# Patient Record
Sex: Female | Born: 1970 | Race: Black or African American | Hispanic: No | Marital: Married | Smoking: Never smoker
Health system: Southern US, Community
[De-identification: ages and names within clinical notes are randomized; demographics above are authoritative.]

## PROBLEM LIST (undated history)

## (undated) DIAGNOSIS — Z9221 Personal history of antineoplastic chemotherapy: Secondary | ICD-10-CM

## (undated) DIAGNOSIS — C50919 Malignant neoplasm of unspecified site of unspecified female breast: Secondary | ICD-10-CM

## (undated) DIAGNOSIS — C50912 Malignant neoplasm of unspecified site of left female breast: Secondary | ICD-10-CM

## (undated) DIAGNOSIS — Z923 Personal history of irradiation: Secondary | ICD-10-CM

## (undated) DIAGNOSIS — R05 Cough: Secondary | ICD-10-CM

## (undated) HISTORY — PX: PORTACATH PLACEMENT: SHX2246

## (undated) HISTORY — PX: UTERINE FIBROID SURGERY: SHX826

## (undated) HISTORY — PX: CYSTECTOMY: SUR359

## (undated) HISTORY — PX: REDUCTION MAMMAPLASTY: SUR839

---

## 1998-06-07 ENCOUNTER — Other Ambulatory Visit: Admission: RE | Admit: 1998-06-07 | Discharge: 1998-06-07 | Payer: Self-pay | Admitting: Obstetrics and Gynecology

## 1998-08-23 ENCOUNTER — Emergency Department (HOSPITAL_COMMUNITY): Admission: EM | Admit: 1998-08-23 | Discharge: 1998-08-23 | Payer: Self-pay

## 2000-05-27 ENCOUNTER — Other Ambulatory Visit: Admission: RE | Admit: 2000-05-27 | Discharge: 2000-05-27 | Payer: Self-pay | Admitting: Obstetrics and Gynecology

## 2003-09-23 ENCOUNTER — Emergency Department (HOSPITAL_COMMUNITY): Admission: AC | Admit: 2003-09-23 | Discharge: 2003-09-23 | Payer: Self-pay

## 2005-06-11 ENCOUNTER — Ambulatory Visit (HOSPITAL_COMMUNITY): Admission: RE | Admit: 2005-06-11 | Discharge: 2005-06-11 | Payer: Self-pay | Admitting: Obstetrics and Gynecology

## 2006-04-18 ENCOUNTER — Inpatient Hospital Stay (HOSPITAL_COMMUNITY): Admission: AD | Admit: 2006-04-18 | Discharge: 2006-04-20 | Payer: Self-pay | Admitting: Obstetrics & Gynecology

## 2006-04-18 ENCOUNTER — Ambulatory Visit: Payer: Self-pay | Admitting: Gynecology

## 2006-04-18 ENCOUNTER — Encounter (INDEPENDENT_AMBULATORY_CARE_PROVIDER_SITE_OTHER): Payer: Self-pay | Admitting: Specialist

## 2006-04-18 HISTORY — PX: ECTOPIC PREGNANCY SURGERY: SHX613

## 2006-04-21 ENCOUNTER — Inpatient Hospital Stay (HOSPITAL_COMMUNITY): Admission: AD | Admit: 2006-04-21 | Discharge: 2006-04-21 | Payer: Self-pay | Admitting: Gynecology

## 2006-04-24 ENCOUNTER — Inpatient Hospital Stay (HOSPITAL_COMMUNITY): Admission: AD | Admit: 2006-04-24 | Discharge: 2006-04-24 | Payer: Self-pay | Admitting: Obstetrics & Gynecology

## 2006-04-24 ENCOUNTER — Ambulatory Visit: Payer: Self-pay | Admitting: Obstetrics & Gynecology

## 2006-05-01 ENCOUNTER — Inpatient Hospital Stay (HOSPITAL_COMMUNITY): Admission: AD | Admit: 2006-05-01 | Discharge: 2006-05-01 | Payer: Self-pay | Admitting: Obstetrics and Gynecology

## 2006-05-08 ENCOUNTER — Inpatient Hospital Stay (HOSPITAL_COMMUNITY): Admission: AD | Admit: 2006-05-08 | Discharge: 2006-05-08 | Payer: Self-pay | Admitting: Obstetrics and Gynecology

## 2006-05-15 ENCOUNTER — Inpatient Hospital Stay (HOSPITAL_COMMUNITY): Admission: AD | Admit: 2006-05-15 | Discharge: 2006-05-15 | Payer: Self-pay | Admitting: Obstetrics and Gynecology

## 2006-05-22 ENCOUNTER — Ambulatory Visit: Payer: Self-pay | Admitting: Obstetrics & Gynecology

## 2006-05-29 ENCOUNTER — Ambulatory Visit: Payer: Self-pay | Admitting: Gynecology

## 2006-06-04 ENCOUNTER — Ambulatory Visit: Payer: Self-pay | Admitting: Gynecology

## 2006-06-11 ENCOUNTER — Ambulatory Visit: Payer: Self-pay | Admitting: Gynecology

## 2007-05-05 ENCOUNTER — Ambulatory Visit (HOSPITAL_COMMUNITY): Admission: RE | Admit: 2007-05-05 | Discharge: 2007-05-05 | Payer: Self-pay | Admitting: Obstetrics and Gynecology

## 2007-05-12 ENCOUNTER — Ambulatory Visit (HOSPITAL_COMMUNITY): Admission: RE | Admit: 2007-05-12 | Discharge: 2007-05-12 | Payer: Self-pay | Admitting: Obstetrics and Gynecology

## 2007-06-16 ENCOUNTER — Ambulatory Visit (HOSPITAL_COMMUNITY): Admission: RE | Admit: 2007-06-16 | Discharge: 2007-06-16 | Payer: Self-pay | Admitting: Obstetrics and Gynecology

## 2007-06-30 ENCOUNTER — Ambulatory Visit (HOSPITAL_COMMUNITY): Admission: RE | Admit: 2007-06-30 | Discharge: 2007-06-30 | Payer: Self-pay | Admitting: Obstetrics and Gynecology

## 2007-08-04 ENCOUNTER — Ambulatory Visit (HOSPITAL_COMMUNITY): Admission: RE | Admit: 2007-08-04 | Discharge: 2007-08-04 | Payer: Self-pay | Admitting: Obstetrics and Gynecology

## 2007-09-01 ENCOUNTER — Ambulatory Visit (HOSPITAL_COMMUNITY): Admission: RE | Admit: 2007-09-01 | Discharge: 2007-09-01 | Payer: Self-pay | Admitting: Obstetrics and Gynecology

## 2007-10-12 ENCOUNTER — Ambulatory Visit (HOSPITAL_COMMUNITY): Admission: RE | Admit: 2007-10-12 | Discharge: 2007-10-12 | Payer: Self-pay | Admitting: Obstetrics and Gynecology

## 2008-11-25 DIAGNOSIS — C50919 Malignant neoplasm of unspecified site of unspecified female breast: Secondary | ICD-10-CM

## 2008-11-25 HISTORY — DX: Malignant neoplasm of unspecified site of unspecified female breast: C50.919

## 2009-08-21 ENCOUNTER — Encounter: Admission: RE | Admit: 2009-08-21 | Discharge: 2009-08-21 | Payer: Self-pay | Admitting: Obstetrics and Gynecology

## 2009-09-04 ENCOUNTER — Encounter: Admission: RE | Admit: 2009-09-04 | Discharge: 2009-09-04 | Payer: Self-pay | Admitting: Obstetrics and Gynecology

## 2009-09-05 ENCOUNTER — Encounter: Admission: RE | Admit: 2009-09-05 | Discharge: 2009-09-05 | Payer: Self-pay | Admitting: Obstetrics and Gynecology

## 2009-09-05 ENCOUNTER — Encounter (INDEPENDENT_AMBULATORY_CARE_PROVIDER_SITE_OTHER): Payer: Self-pay | Admitting: Radiology

## 2009-09-08 ENCOUNTER — Ambulatory Visit: Payer: Self-pay | Admitting: Oncology

## 2009-09-13 ENCOUNTER — Encounter: Admission: RE | Admit: 2009-09-13 | Discharge: 2009-09-13 | Payer: Self-pay | Admitting: Obstetrics and Gynecology

## 2009-09-15 ENCOUNTER — Encounter: Admission: RE | Admit: 2009-09-15 | Discharge: 2009-09-15 | Payer: Self-pay | Admitting: Obstetrics and Gynecology

## 2009-09-15 ENCOUNTER — Encounter: Payer: Self-pay | Admitting: Obstetrics and Gynecology

## 2009-10-04 ENCOUNTER — Ambulatory Visit: Payer: Self-pay | Admitting: Oncology

## 2009-11-03 ENCOUNTER — Encounter (INDEPENDENT_AMBULATORY_CARE_PROVIDER_SITE_OTHER): Payer: Self-pay | Admitting: General Surgery

## 2009-11-03 ENCOUNTER — Inpatient Hospital Stay (HOSPITAL_COMMUNITY): Admission: RE | Admit: 2009-11-03 | Discharge: 2009-11-05 | Payer: Self-pay | Admitting: General Surgery

## 2009-11-03 HISTORY — PX: MASTECTOMY: SHX3

## 2009-11-03 HISTORY — PX: VASCULAR DELAY PRE-TRAM: SHX5364

## 2010-01-23 ENCOUNTER — Encounter (INDEPENDENT_AMBULATORY_CARE_PROVIDER_SITE_OTHER): Payer: Self-pay | Admitting: Plastic Surgery

## 2010-01-23 ENCOUNTER — Inpatient Hospital Stay (HOSPITAL_COMMUNITY)
Admission: RE | Admit: 2010-01-23 | Discharge: 2010-01-26 | Payer: Self-pay | Source: Home / Self Care | Admitting: Plastic Surgery

## 2010-01-23 HISTORY — PX: INCISIONAL HERNIA REPAIR: SHX193

## 2010-01-23 HISTORY — PX: RECONSTRUCTION BREAST W/ TRAM FLAP: SUR1079

## 2010-04-05 ENCOUNTER — Encounter: Admission: RE | Admit: 2010-04-05 | Discharge: 2010-04-05 | Payer: Self-pay | Admitting: General Surgery

## 2010-05-30 ENCOUNTER — Ambulatory Visit
Admission: RE | Admit: 2010-05-30 | Discharge: 2010-05-31 | Payer: Self-pay | Source: Home / Self Care | Admitting: Plastic Surgery

## 2010-05-30 HISTORY — PX: BREAST REDUCTION SURGERY: SHX8

## 2010-05-30 HISTORY — PX: REVISION RECONSTRUCTED BREAST: SUR1278

## 2010-12-04 ENCOUNTER — Encounter
Admission: RE | Admit: 2010-12-04 | Discharge: 2010-12-04 | Payer: Self-pay | Source: Home / Self Care | Attending: General Surgery | Admitting: General Surgery

## 2010-12-16 ENCOUNTER — Encounter: Payer: Self-pay | Admitting: Obstetrics and Gynecology

## 2011-02-14 LAB — CBC
MCHC: 34.7 g/dL (ref 30.0–36.0)
MCV: 87 fL (ref 78.0–100.0)
Platelets: 266 10*3/uL (ref 150–400)

## 2011-02-18 LAB — BASIC METABOLIC PANEL WITH GFR
BUN: 3 mg/dL — ABNORMAL LOW (ref 6–23)
CO2: 25 meq/L (ref 19–32)
Calcium: 8.4 mg/dL (ref 8.4–10.5)
Chloride: 103 meq/L (ref 96–112)
Creatinine, Ser: 0.74 mg/dL (ref 0.4–1.2)
GFR calc non Af Amer: >60 mL/min
Glucose, Bld: 151 mg/dL — ABNORMAL HIGH (ref 70–99)
Potassium: 3.4 meq/L — ABNORMAL LOW (ref 3.5–5.1)
Sodium: 133 meq/L — ABNORMAL LOW (ref 135–145)

## 2011-02-18 LAB — ANAEROBIC CULTURE: Gram Stain: NONE SEEN

## 2011-02-18 LAB — CBC
Hemoglobin: 10.6 g/dL — ABNORMAL LOW (ref 12.0–15.0)
MCHC: 34 g/dL (ref 30.0–36.0)
MCV: 88.2 fL (ref 78.0–100.0)
RDW: 13.2 % (ref 11.5–15.5)

## 2011-02-18 LAB — GRAM STAIN: Gram Stain: NONE SEEN

## 2011-02-18 LAB — BODY FLUID CULTURE: Culture: NO GROWTH

## 2011-02-26 LAB — DIFFERENTIAL
Lymphocytes Relative: 37 % (ref 12–46)
Monocytes Absolute: 0.3 10*3/uL (ref 0.1–1.0)
Monocytes Relative: 6 % (ref 3–12)
Neutro Abs: 2.9 10*3/uL (ref 1.7–7.7)
Neutrophils Relative %: 54 % (ref 43–77)

## 2011-02-26 LAB — CBC
HCT: 38.5 % (ref 36.0–46.0)
Hemoglobin: 11.6 g/dL — ABNORMAL LOW (ref 12.0–15.0)
MCHC: 34.8 g/dL (ref 30.0–36.0)
MCHC: 35.2 g/dL (ref 30.0–36.0)
MCV: 88.4 fL (ref 78.0–100.0)
MCV: 89.7 fL (ref 78.0–100.0)
Platelets: 238 10*3/uL (ref 150–400)
RBC: 3.71 MIL/uL — ABNORMAL LOW (ref 3.87–5.11)
RDW: 12.1 % (ref 11.5–15.5)
WBC: 5.4 10*3/uL (ref 4.0–10.5)
WBC: 8.1 10*3/uL (ref 4.0–10.5)

## 2011-02-26 LAB — BASIC METABOLIC PANEL
CO2: 23 mEq/L (ref 19–32)
Calcium: 9.1 mg/dL (ref 8.4–10.5)
Chloride: 111 mEq/L (ref 96–112)
Creatinine, Ser: 0.75 mg/dL (ref 0.4–1.2)
GFR calc Af Amer: >60 mL/min (ref >60)
Sodium: 140 mEq/L (ref 135–145)

## 2011-02-26 LAB — COMPREHENSIVE METABOLIC PANEL
Albumin: 3.8 g/dL (ref 3.5–5.2)
BUN: 6 mg/dL (ref 6–23)
Calcium: 10 mg/dL (ref 8.4–10.5)
Creatinine, Ser: 0.65 mg/dL (ref 0.4–1.2)
Glucose, Bld: 82 mg/dL (ref 70–99)
Total Protein: 7.3 g/dL (ref 6.0–8.3)

## 2011-02-26 LAB — CEA: CEA: 2.4 ng/mL (ref 0.0–5.0)

## 2011-02-26 LAB — HCG, SERUM, QUALITATIVE: Preg, Serum: NEGATIVE

## 2011-04-12 NOTE — Group Therapy Note (Signed)
Aimee Poole, RICE NO.:  [id number]   MEDICAL RECORD NO.:  [id number]          PATIENT TYPE:  WOC   LOCATION:  [facility]                   FACILITY:  [facility]   PHYSICIAN:  Dorthula Perfect, MD     DATE OF BIRTH:  1970-12-11   DATE OF SERVICE:                                    CLINIC NOTE   This 40 year old African American, gravida 1, underwent operative  laparoscopy followed by laparotomy and partial left salpingectomy by Dr.  Mia Creek on May 25th. She had previously had a right salpingo oophorectomy.  Details are in both his discharge summary and operative note placed in this  chart. The distal half of the left fallopian tube was removed. The patient  said that he then tied off the distal remaining part of the tube, although I  do not see that is specifically mentioned. She stayed in the hospital for  two days. She is back or postoperative day visit and staple removal.   The patient is having some problems with constipation. She is still on  Percocet.   PHYSICAL EXAMINATION:  Her abdomen is somewhat obese. It is soft and  nontender. She has a transverse suprapubic skin incision. Staples are  removed. The left half of the incision has the edges well healed. On the  right side, the skin edges are not directly opposing and the raw area is  granulating in. Dressing is applied.   DIAGNOSIS:  Left tubal ectopic pregnancy.   DISPOSITION:  1.  Repeat quantitative hCG today.  2.  The patient is to return in a week to check her incision and to possibly      repeat the hCG again.  3.  She is encouraged to increase her fluids and to start using prunes or      prune juice as long as she is taking Percocet.           ______________________________  Dorthula Perfect, MD     ER/MEDQ  D:  04/24/2006  T:  04/24/2006  Job:  [id number]

## 2011-04-12 NOTE — Op Note (Signed)
Aimee Poole, Aimee Poole                 ACCOUNT NO.:  [id number]   MEDICAL RECORD NO.:  [id number]          PATIENT TYPE:  INP   LOCATION:  9302                          [facility]   PHYSICIAN:  Ginger Carne, MD  DATE OF BIRTH:  12/23/1970   DATE OF PROCEDURE:  04/18/2006  DATE OF DISCHARGE:                                 OPERATIVE REPORT   PREOPERATIVE DIAGNOSIS:  Left tubal pregnancy.   POSTOPERATIVE DIAGNOSES:  1.  Left tubal pregnancy.  2.  Severe pelvic peritoneal intestinal adhesions.   OPERATIVE PROCEDURE:  Operative laparoscopy followed by laparotomy and  partial excision of distal left tube.   SURGEON:  Ginger Carne, M.D.   COMPLICATIONS:  None immediate.   ESTIMATED BLOOD LOSS:  Less than 75 mL.   SPECIMEN:  Distal portion of left tube.   OPERATIVE FINDINGS:  The patient had on ultrasound a 2 cm gestational sac  noted to be separate and apart from the intrauterine cavity on the left  side.  She had a quantitative value of 8245 units today.  The patient had a  previous right salpingo-oophorectomy and was desirous of a tubal ligation  and/or left salpingectomy to pursue in vitro fertilization in the future.   The patient on laparoscopic evaluation there demonstrated significant and  severe pelvic and peritoneal intestinal adhesions.  Specifically, the left  adnexa and lateral left uterus/cornu had numerous adhesions to the left  pelvic sidewall and large/small bowel.  The left sigmoid colon was attached  to the left pelvic sidewall in addition to portions of small bowel similarly  adherent to the left adnexa.  Despite multiple efforts to perform  adhesiolysis on the left adnexa laparoscopically, it was felt prudent due to  the difficulty of exposure to perform a laparotomy.  No injury of bowel was  noted at the time of the laparoscopic portion.  Upon laparotomy, the  adhesive disease was dealt with by careful adhesiolysis of omentum and  distal small  bowel and portion of rectosigmoid colon that was dissected off  the left ovary.  The proximal portion of the left tube could not be  identified.  The left cornu and lateral uterine wall were densly adherent to  the pelic side wall. These areas were friable due to decidual changes, bled  easily and the risk of tearing too great to continue dissection. Only the  distal portion of the left tube could be identified.  The ampullary region  had numerous adhesions of the rectosigmoid, and it was felt imprudent to try  and dissect this area because of encountering bleeding and possible injury  to the serosa of large bowel.  For this reason , only the distal portion of  the tube could be removed and sent for pathology.  Bleeding points were  hemostatically checked, and the ovary was intact and not bleeding.  The  uterus could not be sufficiently mobilized to identify the proximal portion  without risking injury to the bowel and causing bleeding that would be  difficult to control.  The right adnexa had been previously excised  prior to  closure of all bowel within the pelvis and was again carefully identified  without injury noted.   OPERATIVE PROCEDURE:  The patient was prepped and draped in the usual  fashion and placed in the lithotomy position.  Betadine solution used for  antiseptic, and the patient was catheterized prior to procedure.  After  adequate general anesthesia, the tenaculum placed on the cervix and a  Hickman tenaculum placed in the endocervical canal.  This was then followed  by a  vertical infraumbilical incision and a Veress needle placed in same  incision.  Opening closing pressures were 10-15 mmHg.  The needle released  and the trocar placed in the same incision.  Laparoscope placed in the  trocar sleeve.  Two 5 mm ports were made in the left lower quadrant and left  hypogastric regions under direct visualization.  Attempts at adhesiolysis of  the left adnexa proved  unsuccessful to adequately mobilize the entire left  tube.  For this reason, the procedure was discontinued, gas released,  trocars removed.  Closure of the 10 mm fascia site with 0 Vicryl suture and  4-0 Vicryl for subcuticular closure.   The patient was repositioned and a Pfannenstiel incision was made.  Careful  dissection of the left half of the uterus including its adnexa and taking  down adhesions of bowel and omentum was performed in spite of multiple  efforts as described in the findings section.  It was impossible to identify  the proximal portion of the tube or the cornual region of the uterus.  Only  the ovary and the distal portion of the left tube could be carefully  identified.  The distal portion of tube was clamped, cut and ligated with 0  Vicryl  suture and sent for pathology.  Although this was not a proximal  ligation, this was the best effort that could be performed at the time.  Bleeding points hemostatically checked.  Blood clots removed.  Copious  irrigation with lactated Ringer's followed.  Inspection of the rectosigmoid  colon and large bowel revealed no injuries to serosa or other structures.  Closure of the fascia in one layer with 0 PDS running suture and skin  staples for the skin.  Instrument and sponge count were correct.  The  patient tolerated the procedure well and returned to the post anesthesia  recovery room in excellent condition.      Ginger Carne, MD  Electronically Signed     SHB/MEDQ  D:  04/18/2006  T:  04/19/2006  Job:  [id number]

## 2011-04-12 NOTE — Group Therapy Note (Signed)
Aimee Poole, Aimee Poole NO.:  [id number]   MEDICAL RECORD NO.:  [id number]          PATIENT TYPE:  WOC   LOCATION:  [facility]                   FACILITY:  [facility]   PHYSICIAN:  Dorthula Perfect, MD     DATE OF BIRTH:  04/21/1971   DATE OF SERVICE:                                    CLINIC NOTE   This 40 year old African-American female, gravida 1, underwent operative  laparoscopy followed by laparotomy and partial left salpingectomy by Dr.  Mia Creek on May 25th for an ectopic pregnancy.  She had previously had a  salpingo-oophorectomy.  The surgery was complicated by massive pelvic  adhesions.  Pathology report did not reveal the presence of trophoblastic  disease.  She was treated with methotrexate.  She has been having weekly  quantitative HCGs done.  On June 7th, the value was 2747.  On June 14th,  1471.  On June 21, the value was 762.  The patient has obviously not started  her menstrual period yet.  She is asymptomatic.  She returns today for  repeat quantitative HCG as well as check of her incision and pelvic exam.  She is not having problems with constipation now.   PHYSICAL EXAMINATION:  ABDOMEN:  Her abdomen is somewhat obese, soft and  nontender.  Her transverse suprapubic skin incision is now well healed, and  there are no raw areas left.  PELVIC:  External genitalia, BUS, and vaginal vault were visualized, as was  the cervix.  The patient has mild lower abdominal tenderness but no  guarding.  It is hard to feel the uterus.  No adnexal masses are noted.   DIAGNOSIS:  Left tubal ectopic pregnancy, status post surgery and  methotrexate.   DISPOSITION:  1.  Repeat quantitative HCG today.  2.  Repeat quantitative HCG in one week.  3.  Patient __________ to be re-examined again in two weeks.  4.  If the values do not continue to drop satisfactorily, we will have to      consider additional treatment with methotrexate.     ______________________________  Dorthula Perfect, MD     ER/MEDQ  D:  05/22/2006  T:  05/22/2006  Job:  [id number]

## 2011-04-12 NOTE — Discharge Summary (Signed)
[name], [name]                 ACCOUNT NO.:  [id number]   MEDICAL RECORD NO.:  [id number]          PATIENT TYPE:  INP   LOCATION:  9302                          [facility]   PHYSICIAN:  Ginger Carne, MD  DATE OF BIRTH:  [birth year]   DATE OF ADMISSION:  04/18/2006  DATE OF DISCHARGE:  04/20/2006                                 DISCHARGE SUMMARY   REASON FOR HOSPITALIZATION:  Left tubal pregnancy.   IN-HOSPITAL PROCEDURES:  1.  Operative laparoscopy, followed by laparotomy and partial left      salpingectomy.  2.  Methotrexate administration.   FINAL DIAGNOSIS:  Left tubal pregnancy.   HOSPITAL COURSE:  This patient is a 40 year old gravida 1, para 0, African-  American female who presented to the maternity admission unit with left  lower quadrant pain.  The patient had a quantitative HCG of 8245 and a 2 cm  cystic mass on the left adnexa without an intrauterine pregnancy identified.  The patient has had in 1999 a right salpingo-oophorectomy and in 2006 a  myomectomy followed by a diagnostic laparoscopy.  At the time of  laparoscopic surgery on Apr 18, 2006, the patient was noted to have  significantly dense adhesions of the left adnexa with an immobile uterus.  Because of safety reasons, it was deemed appropriate to pursue a laparotomy  to avoid injury to both large and small bowel.  The patient had also desired  to have her remaining tube tied to pursue in vitro fertilization in the  future.  Upon laparotomy, it was impossible to dissect the lateral portion  of the uterus on the left side without risking injury to said structure,  including bleeding and tearing of the cornual portion of the uterus.  The  rectosigmoid colon in addition to dense adhesions in the left pelvic  sidewall made it virtually impossible to even identify the entire length of  the left tube.  Only the distal 2 cm of left tube could be clearly seen.  The left ovary was seen but the ectopic proper  could not be identified.  The  tube was extremely friable, as was the uterus, and it was therefore decided  to stop any further pursuit in identification of the ectopic and proceed  with methotrexate administration.  Because of the aforementioned findings,  the left tube could not be ligated.   The patient received appropriate doses of methotrexate for an ectopic  pregnancy on Apr 18, 2006.  All blood tests parameters were within normal  limits.  The patient is A negative.   The patient received an intramuscular RhoGAM.  She remained afebrile.  Vital  signs were stable postoperatively.  Abdomen was soft.  Incision dry/  The  patient had not passed flatus at the time of discharge but had good bowel  sounds.  Calves without tenderness and lungs were clear.   The patient will be followed in the maternity admission unit on Apr 21, 2006, for a quantitative HCG and on Apr 24, 2006, a repeat quantitative HCG  and staple removal.  Instructions including  contacting the office for  temperature elevation above 100.4 degrees Fahrenheit, difficulty with bowel  movements, flatus, abdominal swelling, incisional  pain or drainage, calf tenderness or any other complaints, the patient is to  notify us immediately.  The patient was prescribed Percocet 5/325 mg one 1  to 2 every four to six hours.  All questions answered to the satisfaction of  said patient.      Ginger Carne, MD  Electronically Signed     SHB/MEDQ  D:  04/20/2006  T:  04/21/2006  Job:  [id number]

## 2011-04-29 ENCOUNTER — Encounter (INDEPENDENT_AMBULATORY_CARE_PROVIDER_SITE_OTHER): Payer: Self-pay | Admitting: General Surgery

## 2011-07-17 ENCOUNTER — Encounter (INDEPENDENT_AMBULATORY_CARE_PROVIDER_SITE_OTHER): Payer: Self-pay | Admitting: General Surgery

## 2011-09-24 ENCOUNTER — Ambulatory Visit (INDEPENDENT_AMBULATORY_CARE_PROVIDER_SITE_OTHER): Payer: BC Managed Care – PPO | Admitting: General Surgery

## 2011-09-24 ENCOUNTER — Encounter (INDEPENDENT_AMBULATORY_CARE_PROVIDER_SITE_OTHER): Payer: Self-pay | Admitting: General Surgery

## 2011-09-24 VITALS — BP 132/98 | HR 88 | Temp 97.3°F | Resp 12 | Ht 62.0 in | Wt 190.0 lb

## 2011-09-24 DIAGNOSIS — Z853 Personal history of malignant neoplasm of breast: Secondary | ICD-10-CM

## 2011-09-24 NOTE — Progress Notes (Signed)
Subjective:     Patient ID: Aimee Poole, female   DOB: December 14, 1970, 40 y.o.   MRN: [id number]  HPI Is is a 40 year old female who underwent a left simple mastectomy and axillary symptoms a biopsy for DCIS in 2010. She was estrogen receptor progesterone receptor negative. Had no flatus or a conference it was decided not to pursue endocrine or radiation therapy at that point. She has since undergone a TRAM flap on the left side as well as a reduction on the other side as well. She has no real complaints except for some tenderness at her incision is right now. She underwent a mammogram in January of this year which is negative and she is due for another mammogram in January she does inquire today about weight loss options we discussed weight loss surgery today. She has no complaints referable to her breasts or any concerns about recurrence on her own exam.  Review of Systems     Objective:   Physical Exam  Constitutional: She appears well-developed and well-nourished.  Pulmonary/Chest: Right breast exhibits no inverted nipple, no mass, no nipple discharge, no skin change and no tenderness. Left breast exhibits no mass and no tenderness. Breasts are symmetrical.    Lymphadenopathy:    She has no cervical adenopathy.    She has no axillary adenopathy.       Right: No supraclavicular adenopathy present.       Left: No supraclavicular adenopathy present.       Assessment:     Stage 0 left breast cancer s/p mastectomy    Plan:     She is due for her mammogram in January. We discussed again self exams. She should continue clinical exams every 6 months and I will plan on seeing her back in 6 months. I also gave her information on her weight loss surgery seminars and she very much interested at this point.

## 2011-09-24 NOTE — Patient Instructions (Signed)
Breast Problems and Self-Exam Completing monthly breast exams may pick up problems early and save lives. There can be numerous causes of swelling, tenderness or lumps in the breasts. Some of these causes are:   Fibrocystic breast syndrome (noncancerous lumps). This is the most common cause of lumps in the breast.   Fibroadenoma breast tumors of unknown cause. These are noncancerous (benign) lumps.   Benign fatty tumors (lipomas).   Cancer of the breast.  By doing monthly breast exams, you get to know how your breasts feel and how they can change from month to month. This allows you to notice changes early. It can also offer you some reassurance that your breast health is good.  BREAST SELF-EXAM There are a few points to follow when doing a thorough breast exam. The best time to examine your breasts is 5 to 7 days after your menstrual period is over. During menstruation, the breasts are lumpier, and it may be more difficult to pick up changes. If you do not menstruate, have reached menopause, or had a hysterectomy (uterus removal), examine your breasts the first day of every month. After 3 to 4 months, you will become more familiar with the variations of your breasts and more comfortable with the exam.  Perform your breast exam monthly. Keep a written record with breast changes or normal findings for each breast. This makes it easier to be sure of changes, so you do not need to depend only on memory for size, tenderness, or location. Try to do the exam at the same time each month, and write down where you are in your menstrual cycle, if you are still menstruating.   Look at your breasts. Stand in front of a mirror with your hands clasped behind your head. Tighten your chest muscles and look for asymmetry. This means a difference in shape or contour from one breast to the other, such as puckers, dips or bumps. Also, look for skin changes.   Lean forward with your hands on your hips. Again, look for  symmetry and skin changes.   While showering, soap the breasts. Then, carefully feel the breasts with your fingertips, while holding the other arm (on the side of the breast you are examining) over your head. Do this with each breast, carefully feeling for lumps or changes. Typically, a circular motion with moderate fingertip pressure should be used.   Repeat this exam while lying on your back. Put your arm behind your head and a pillow under your shoulders. Again, use your fingertips to examine both breasts, feeling for lumps and thickening. Begin at the top of your breast, and go clockwise around the whole breast.   At the end of your exam, gently squeeze each nipple to see if there is any drainage of fluids. Look for nipple changes, dimpling, or redness.   Lastly, examine the upper chest and collarbone (clavicle) areas, and in your armpits.  It is not necessary to be alarmed if you find a breast lump. Most of them are not cancerous. However, it is necessary to see your caregiver if a lump is found, in order to have it looked at. Document Released: 11/11/2005 Document Revised: 07/24/2011 Document Reviewed: 02/28/2009 [employer] Patient Information 2012 [employer]. 

## 2012-02-27 ENCOUNTER — Other Ambulatory Visit (INDEPENDENT_AMBULATORY_CARE_PROVIDER_SITE_OTHER): Payer: Self-pay | Admitting: General Surgery

## 2012-02-27 DIAGNOSIS — Z1231 Encounter for screening mammogram for malignant neoplasm of breast: Secondary | ICD-10-CM

## 2012-02-28 ENCOUNTER — Ambulatory Visit
Admission: RE | Admit: 2012-02-28 | Discharge: 2012-02-28 | Disposition: A | Discharge status: Home / Self Care | Payer: BC Managed Care – PPO | Source: Ambulatory Visit | Attending: General Surgery | Admitting: General Surgery

## 2012-02-28 DIAGNOSIS — Z1231 Encounter for screening mammogram for malignant neoplasm of breast: Secondary | ICD-10-CM

## 2012-03-13 ENCOUNTER — Encounter (INDEPENDENT_AMBULATORY_CARE_PROVIDER_SITE_OTHER): Payer: Self-pay | Admitting: General Surgery

## 2012-03-13 ENCOUNTER — Ambulatory Visit (INDEPENDENT_AMBULATORY_CARE_PROVIDER_SITE_OTHER): Payer: BC Managed Care – PPO | Admitting: General Surgery

## 2012-03-13 VITALS — BP 118/70 | HR 80 | Temp 97.8°F | Resp 20 | Ht 61.5 in | Wt 185.0 lb

## 2012-03-13 DIAGNOSIS — C50412 Malignant neoplasm of upper-outer quadrant of left female breast: Secondary | ICD-10-CM | POA: Insufficient documentation

## 2012-03-13 DIAGNOSIS — Z853 Personal history of malignant neoplasm of breast: Secondary | ICD-10-CM

## 2012-03-13 DIAGNOSIS — C50919 Malignant neoplasm of unspecified site of unspecified female breast: Secondary | ICD-10-CM

## 2012-03-13 NOTE — Patient Instructions (Signed)

## 2012-03-13 NOTE — Progress Notes (Signed)
Subjective:     Patient ID: Aimee Poole, female   DOB: [birth year], 41 y.o.   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 This was worse after negative. She has been followed by me with clinical exams every 6 months since then. She has a recent mammogram that is rated BI-RADS one and is due for another mammogram in one year. She reports no complaints referable to her breast today except for some itching at the site of her incisions. She has undergone a reduction on the right side as well. She comes in today for followup.  Review of Systems DIGITAL SCREENING MAMMOGRAM WITH CAD:  There are scattered fibroglandular densities. Left TRAM negative. No masses or malignant type  calcifications are identified. Compared with prior studies.  Images were processed with CAD.  IMPRESSION:  No specific mammographic evidence of malignancy. Next screening mammogram is recommended in one  year.  A result letter of this screening mammogram will be mailed directly to the patient.  ASSESSMENT: Negative - BI-RADS 1     Objective:   Physical Exam  Vitals reviewed. Constitutional: She appears well-developed and well-nourished.  Pulmonary/Chest: Right breast exhibits no inverted nipple, no mass, no nipple discharge, no skin change and no tenderness. Left breast exhibits no mass and no tenderness.    Lymphadenopathy:    She has no cervical adenopathy.    She has no axillary adenopathy.       Right: No supraclavicular adenopathy present.       Left: No supraclavicular adenopathy present.       Assessment:     History stage 0 left breast cancer s/p mastectomy    Plan:     No clinical evidence of recurrence MMG fine repeat in one year Monthly SBE Return for clinical exam in 6 months Discussed weight loss options again today

## 2012-03-20 ENCOUNTER — Other Ambulatory Visit: Payer: Self-pay | Admitting: Obstetrics and Gynecology

## 2012-03-20 ENCOUNTER — Other Ambulatory Visit (HOSPITAL_COMMUNITY)
Admission: RE | Admit: 2012-03-20 | Discharge: 2012-03-20 | Disposition: A | Discharge status: Home / Self Care | Payer: BC Managed Care – PPO | Source: Ambulatory Visit | Attending: Obstetrics and Gynecology | Admitting: Obstetrics and Gynecology

## 2012-03-20 DIAGNOSIS — Z124 Encounter for screening for malignant neoplasm of cervix: Secondary | ICD-10-CM | POA: Insufficient documentation

## 2012-09-25 ENCOUNTER — Telehealth (INDEPENDENT_AMBULATORY_CARE_PROVIDER_SITE_OTHER): Payer: Self-pay

## 2012-09-25 NOTE — Telephone Encounter (Signed)
LMOV pt's appt has been moved from 10/02/12 at 9:40am, to 09/29/12 at 9:40am.

## 2012-09-28 ENCOUNTER — Telehealth (INDEPENDENT_AMBULATORY_CARE_PROVIDER_SITE_OTHER): Payer: Self-pay | Admitting: General Surgery

## 2012-09-28 NOTE — Telephone Encounter (Signed)
LMOM letting pt know that I am having to reschedule her appt on 11/5 to 11/21 at 4:40.

## 2012-09-29 ENCOUNTER — Ambulatory Visit (INDEPENDENT_AMBULATORY_CARE_PROVIDER_SITE_OTHER): Payer: BC Managed Care – PPO | Admitting: General Surgery

## 2012-10-02 ENCOUNTER — Ambulatory Visit (INDEPENDENT_AMBULATORY_CARE_PROVIDER_SITE_OTHER): Payer: BC Managed Care – PPO | Admitting: General Surgery

## 2012-10-15 ENCOUNTER — Ambulatory Visit (INDEPENDENT_AMBULATORY_CARE_PROVIDER_SITE_OTHER): Payer: BC Managed Care – PPO | Admitting: General Surgery

## 2012-10-16 ENCOUNTER — Encounter (INDEPENDENT_AMBULATORY_CARE_PROVIDER_SITE_OTHER): Payer: Self-pay | Admitting: General Surgery

## 2012-10-16 ENCOUNTER — Ambulatory Visit (INDEPENDENT_AMBULATORY_CARE_PROVIDER_SITE_OTHER): Payer: BC Managed Care – PPO | Admitting: General Surgery

## 2012-10-16 VITALS — BP 104/76 | HR 85 | Temp 97.8°F | Ht 61.5 in | Wt 180.0 lb

## 2012-10-16 DIAGNOSIS — Z853 Personal history of malignant neoplasm of breast: Secondary | ICD-10-CM

## 2012-10-16 NOTE — Patient Instructions (Signed)

## 2012-10-18 NOTE — Progress Notes (Signed)
Subjective:     Patient ID: Aimee Poole, female   DOB: [birth year], 41 y.o.   MRN: [id number]  HPI 46 yof who underwent left sm/sn for large area of dcis followed by tram flap.  She reports no real complaints with either breast right now.  I have been seeing every six months for exams.  She has nl mmg in 4/13.  She is otherwise doing well without any other health issues that have arisen.  Review of Systems     Objective:   Physical Exam  Vitals reviewed. Constitutional: She appears well-developed and well-nourished.  Neck: Neck supple.  Pulmonary/Chest: Right breast exhibits no inverted nipple, no mass, no nipple discharge, no skin change and no tenderness. Left breast exhibits no mass and no tenderness.    Lymphadenopathy:    She has no cervical adenopathy.    She has no axillary adenopathy.       Right: No supraclavicular adenopathy present.       Left: No supraclavicular adenopathy present.       Assessment:     History left breast dcis    Plan:     She has no clinical evidence of recurrence. She is just going to continue following other side with exams, mmg and clinical exams every six months.  We discussed briefly general health issues.  I will continue to see every six months.

## 2013-02-18 ENCOUNTER — Encounter (INDEPENDENT_AMBULATORY_CARE_PROVIDER_SITE_OTHER): Payer: Self-pay | Admitting: General Surgery

## 2013-02-18 ENCOUNTER — Other Ambulatory Visit: Payer: Self-pay

## 2013-02-18 DIAGNOSIS — Z1231 Encounter for screening mammogram for malignant neoplasm of breast: Secondary | ICD-10-CM

## 2013-03-12 ENCOUNTER — Ambulatory Visit
Admission: RE | Admit: 2013-03-12 | Discharge: 2013-03-12 | Disposition: A | Discharge status: Home / Self Care | Payer: BC Managed Care – PPO | Source: Ambulatory Visit

## 2013-03-12 ENCOUNTER — Other Ambulatory Visit: Payer: Self-pay

## 2013-03-12 DIAGNOSIS — Z1231 Encounter for screening mammogram for malignant neoplasm of breast: Secondary | ICD-10-CM

## 2013-05-07 ENCOUNTER — Ambulatory Visit (INDEPENDENT_AMBULATORY_CARE_PROVIDER_SITE_OTHER): Payer: BC Managed Care – PPO | Admitting: General Surgery

## 2013-07-16 ENCOUNTER — Encounter (INDEPENDENT_AMBULATORY_CARE_PROVIDER_SITE_OTHER): Payer: Self-pay | Admitting: General Surgery

## 2013-07-16 ENCOUNTER — Ambulatory Visit (INDEPENDENT_AMBULATORY_CARE_PROVIDER_SITE_OTHER): Payer: BC Managed Care – PPO | Admitting: General Surgery

## 2013-07-16 VITALS — BP 122/80 | HR 70 | Resp 18 | Ht 61.5 in | Wt 179.4 lb

## 2013-07-16 DIAGNOSIS — Z853 Personal history of malignant neoplasm of breast: Secondary | ICD-10-CM

## 2013-07-16 NOTE — Progress Notes (Signed)
Subjective:     Patient ID: Aimee Poole, female   DOB: 04-23-1971, 42 y.o.   MRN: [id number]  HPI 89 yof who underwent left sm/sn for large area of dcis followed by tram flap. She reports no real complaints with either breast right now. I have been seeing every six months for exams. She has nl mmg in 4/14. She is otherwise doing well without any other health issues that have arisen since our last visit.  She is doing self exams and has noted no changes.  She has no nipple discharge. She has two of her nieces as well as her 15 year old living in her house.  She is overall doing well.   Review of Systems RIGHT DIGITAL SCREENING MAMMOGRAM WITH CAD  Comparison: None.  FINDINGS:  ACR Breast Density Category 2: There is a scattered fibroglandular  pattern.  The patient has had a left mastectomy. No suspicious masses,  architectural distortion, or calcifications are present.  Images were processed with CAD.  IMPRESSION:  No evidence of malignancy. Screening mammography is recommended in  one year.  BI-RADS CATEGORY 1: Negative.      Objective:   Physical Exam  Vitals reviewed. Constitutional: She appears well-developed and well-nourished.  Pulmonary/Chest: Right breast exhibits no inverted nipple, no mass, no nipple discharge, no skin change and no tenderness. Left breast exhibits no mass, no skin change and no tenderness.    Lymphadenopathy:    She has no cervical adenopathy.    She has no axillary adenopathy.       Right: No supraclavicular adenopathy present.       Left: No supraclavicular adenopathy present.       Assessment:     Left breast dcis s/p mastectomy    Plan:     She has no clinical evidence of recurrence.  We discussed follow up with exams and annual mammography.  I will continue to see every 6 months.  She has some extra skin lateral on left side and this breast is a little bigger.  I offered her referral to see another plastic surgeon but she does not want to  undergo any more surgery right now.We discussed healthy living and exercise also.

## 2013-11-25 DIAGNOSIS — Z9221 Personal history of antineoplastic chemotherapy: Secondary | ICD-10-CM

## 2013-11-25 DIAGNOSIS — Z923 Personal history of irradiation: Secondary | ICD-10-CM

## 2013-11-25 HISTORY — DX: Personal history of irradiation: Z92.3

## 2013-11-25 HISTORY — DX: Personal history of antineoplastic chemotherapy: Z92.21

## 2013-12-14 ENCOUNTER — Encounter (INDEPENDENT_AMBULATORY_CARE_PROVIDER_SITE_OTHER): Payer: Self-pay | Admitting: General Surgery

## 2014-02-11 ENCOUNTER — Ambulatory Visit (INDEPENDENT_AMBULATORY_CARE_PROVIDER_SITE_OTHER): Payer: BC Managed Care – PPO | Admitting: General Surgery

## 2014-02-11 ENCOUNTER — Encounter (INDEPENDENT_AMBULATORY_CARE_PROVIDER_SITE_OTHER): Payer: Self-pay | Admitting: General Surgery

## 2014-02-11 VITALS — BP 128/80 | HR 70 | Resp 16 | Ht 61.5 in | Wt 176.0 lb

## 2014-02-11 DIAGNOSIS — C50919 Malignant neoplasm of unspecified site of unspecified female breast: Secondary | ICD-10-CM

## 2014-02-11 NOTE — Patient Instructions (Signed)

## 2014-02-11 NOTE — Progress Notes (Signed)
Subjective:     Patient ID: Aimee Poole, female   DOB: 10-19-1971, 43 y.o.   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 She reports no real complaints with either breast right now except the lateral portion of tram bothers her. I have been seeing every six months for exams. She has nl mmg in 4/14. She is otherwise doing well without any other health issues that have arisen. Her father in law recently passed from myeloma. No issues with left arm.   Review of Systems RIGHT DIGITAL SCREENING MAMMOGRAM WITH CAD  Comparison: None.  FINDINGS:  ACR Breast Density Category 2: There is a scattered fibroglandular  pattern.  The patient has had a left mastectomy. No suspicious masses,  architectural distortion, or calcifications are present.  Images were processed with CAD.  IMPRESSION:  No evidence of malignancy. Screening mammography is recommended in  one year.  BI-RADS CATEGORY 1: Negative.     Objective:   Physical Exam  Constitutional: She appears well-developed and well-nourished.  Pulmonary/Chest: Right breast exhibits no inverted nipple, no mass, no nipple discharge, no skin change and no tenderness. Left breast exhibits no mass and no tenderness.    Lymphadenopathy:    She has no cervical adenopathy.    She has no axillary adenopathy.       Right: No supraclavicular adenopathy present.       Left: No supraclavicular adenopathy present.       Assessment:     Stage 0 left breast cancer    Plan:     She has no clinical evidence of recurrence. She's going to get her mammogram in April area she will consider doing monthly self exams I will see her back in one year for another exam. After that I could release her to her primary care physician her gynecologist. She will also call me back sooner she desires to be seen for the lateral portion of her TRAM flap that is bothering her arm right now.

## 2014-08-16 ENCOUNTER — Other Ambulatory Visit: Payer: Self-pay

## 2014-08-16 DIAGNOSIS — Z1231 Encounter for screening mammogram for malignant neoplasm of breast: Secondary | ICD-10-CM

## 2014-09-09 ENCOUNTER — Ambulatory Visit
Admission: RE | Admit: 2014-09-09 | Discharge: 2014-09-09 | Disposition: A | Discharge status: Home / Self Care | Payer: BC Managed Care – PPO | Source: Ambulatory Visit

## 2014-09-09 ENCOUNTER — Ambulatory Visit
Admission: RE | Admit: 2014-09-09 | Discharge: 2014-09-09 | Disposition: A | Discharge status: Home / Self Care | Payer: BC Managed Care – PPO | Source: Ambulatory Visit | Attending: General Surgery | Admitting: General Surgery

## 2014-09-09 ENCOUNTER — Other Ambulatory Visit (INDEPENDENT_AMBULATORY_CARE_PROVIDER_SITE_OTHER): Payer: Self-pay | Admitting: General Surgery

## 2014-09-09 DIAGNOSIS — Z1231 Encounter for screening mammogram for malignant neoplasm of breast: Secondary | ICD-10-CM

## 2014-09-09 DIAGNOSIS — N644 Mastodynia: Secondary | ICD-10-CM

## 2014-09-09 DIAGNOSIS — N6320 Unspecified lump in the left breast, unspecified quadrant: Secondary | ICD-10-CM

## 2014-09-12 ENCOUNTER — Ambulatory Visit
Admission: RE | Admit: 2014-09-12 | Discharge: 2014-09-12 | Disposition: A | Discharge status: Home / Self Care | Payer: BC Managed Care – PPO | Source: Ambulatory Visit | Attending: General Surgery | Admitting: General Surgery

## 2014-09-12 DIAGNOSIS — N6320 Unspecified lump in the left breast, unspecified quadrant: Secondary | ICD-10-CM

## 2014-09-13 ENCOUNTER — Other Ambulatory Visit (INDEPENDENT_AMBULATORY_CARE_PROVIDER_SITE_OTHER): Payer: Self-pay | Admitting: General Surgery

## 2014-09-13 DIAGNOSIS — C50912 Malignant neoplasm of unspecified site of left female breast: Secondary | ICD-10-CM

## 2014-09-16 ENCOUNTER — Telehealth: Payer: Self-pay | Admitting: *Deleted

## 2014-09-16 NOTE — Telephone Encounter (Signed)
Called pt to schedule new pt appt and genetics appt. Pt is scheduled and confirmed for Dr Lindi Adie on 09/20/14 at 1200 and genetics on 09/22/14 at 3:00. Pt was driving at the time of our discussion. She requested I leave all the information on her vm. I left appt times and contact information. Dr. Donne Hazel is aware of her appts for next week.

## 2014-09-20 ENCOUNTER — Ambulatory Visit
Admission: RE | Admit: 2014-09-20 | Discharge: 2014-09-20 | Disposition: A | Discharge status: Home / Self Care | Payer: BC Managed Care – PPO | Source: Ambulatory Visit | Attending: General Surgery | Admitting: General Surgery

## 2014-09-20 ENCOUNTER — Ambulatory Visit: Payer: BC Managed Care – PPO

## 2014-09-20 ENCOUNTER — Ambulatory Visit (HOSPITAL_BASED_OUTPATIENT_CLINIC_OR_DEPARTMENT_OTHER): Payer: BC Managed Care – PPO | Admitting: Hematology and Oncology

## 2014-09-20 ENCOUNTER — Encounter: Payer: Self-pay | Admitting: Hematology and Oncology

## 2014-09-20 VITALS — BP 143/75 | HR 94 | Temp 97.6°F | Resp 18 | Ht 61.0 in | Wt 184.8 lb

## 2014-09-20 DIAGNOSIS — C50412 Malignant neoplasm of upper-outer quadrant of left female breast: Secondary | ICD-10-CM

## 2014-09-20 DIAGNOSIS — C50912 Malignant neoplasm of unspecified site of left female breast: Secondary | ICD-10-CM

## 2014-09-20 MED ORDER — GADOBENATE DIMEGLUMINE 529 MG/ML IV SOLN
17.0000 mL | Freq: Once | INTRAVENOUS | Status: AC | PRN
  Administered 2014-09-20: 17 mL via INTRAVENOUS

## 2014-09-20 NOTE — Addendum Note (Signed)
Addended by: Prentiss Bells on: 09/20/2014 07:49 PM   Modules accepted: Orders

## 2014-09-20 NOTE — Progress Notes (Signed)
Checked in new patient with no financial issues prior to seeing the dr. She has my card if asst is needed. She has [employer] insurance. She has not been out of the country and she has her appt card.

## 2014-09-20 NOTE — Progress Notes (Signed)
[facility] CONSULT NOTE  Patient Care Team: No Pcp Per Patient as PCP - General (General Practice)  CHIEF COMPLAINTS/PURPOSE OF CONSULTATION:  Newly diagnosed left breast cancer in the reconstructed breast  HISTORY OF PRESENTING ILLNESS:  Aimee Poole 43 y.o. female is here because of recent diagnosis of left breast cancer. 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 She then underwent reconstruction with TRAM flap. She had been doing well up until last year when she started having rest discomfort. This year she requested and Dr. mammogram of the left breast. It revealed an abnormality in the upper ultrasound measured 4.4 cm in size. She then underwent MRI of the breast which revealed a 4 cm mass in the left breast adjacent to the pectoralis muscle. She underwent a biopsy that showed invasive ductal carcinoma ER 7% PR 0% HER-2/neu amplified ratio 3.84. She is here today accompanied by her family to discuss the treatment plan.  I reviewed her records extensively and collaborated the history with the patient.  SUMMARY OF ONCOLOGIC HISTORY: Oncology History   DCIS left breast     Breast cancer of upper-outer quadrant of left female breast   05/10/2009 Initial Diagnosis DCIS left breast Treated with left mastectomy 19.5 cm DCIS ER 0% PR 0%   09/09/2014 Relapse/Recurrence Ultrasound left breast: hypoechoic mass measuring 1.6 x 4.4 x 3.3 cm  MRI breast: 4 cm mass in the left breast close to the pectoralis, no lymphadenopathy   09/12/2014 Initial Biopsy Invasive ductal carcinoma ER 7%, PR 0%, HER-2 amplified ratio 3.84 average in copy #7.3    MEDICAL HISTORY:  Past Medical History  Diagnosis Date  . Cancer     left breast dcis    SURGICAL HISTORY: Past Surgical History  Procedure Laterality Date  . Mastectomy  2010  . Breast reconstruction  2011  . Breast reduction surgery  2011  . Ectopic pregnancy  surgery    . Cystectomy      ovary as well as scar tissue    SOCIAL HISTORY: History   Social History  . Marital Status: Married    Spouse Name: N/A    Number of Children: N/A  . Years of Education: N/A   Occupational History  . Not on file.   Social History Main Topics  . Smoking status: Never Smoker   . Smokeless tobacco: Not on file  . Alcohol Use: No  . Drug Use: No  . Sexual Activity:    Other Topics Concern  . Not on file   Social History Narrative  . No narrative on file    FAMILY HISTORY: Family History  Problem Relation Age of Onset  . Cancer Maternal Aunt     breast    ALLERGIES:  has No Known Allergies.  MEDICATIONS:  No current outpatient prescriptions on file.   No current facility-administered medications for this visit.    REVIEW OF SYSTEMS:   Constitutional: Denies fevers, chills or abnormal night sweats Eyes: Denies blurriness of vision, double vision or watery eyes Ears, nose, mouth, throat, and face: Denies mucositis or sore throat Respiratory: Denies cough, dyspnea or wheezes Cardiovascular: Denies palpitation, chest discomfort or lower extremity swelling Gastrointestinal:  Denies nausea, heartburn or change in bowel habits Skin: Denies abnormal skin rashes Lymphatics: Denies new lymphadenopathy or easy bruising Neurological:Denies numbness, tingling or new weaknesses Behavioral/Psych: Mood is stable, no new changes  Breast: Discomfort in the left breast  All other systems were reviewed with the patient and are negative.  PHYSICAL EXAMINATION: ECOG PERFORMANCE STATUS: 1 - Symptomatic but completely ambulatory  Filed Vitals:   09/20/14 1132  BP: 143/75  Pulse: 94  Temp: 97.6 F (36.4 C)  Resp: 18   Filed Weights   09/20/14 1132  Weight: 184 lb 12.8 oz (83.825 kg)    GENERAL:alert, no distress and comfortable SKIN: skin color, texture, turgor are normal, no rashes or significant lesions EYES: normal, conjunctiva are pink  and non-injected, sclera clear OROPHARYNX:no exudate, no erythema and lips, buccal mucosa, and tongue normal  NECK: supple, thyroid normal size, non-tender, without nodularity LYMPH:  no palpable lymphadenopathy in the cervical, axillary or inguinal LUNGS: clear to auscultation and percussion with normal breathing effort HEART: regular rate & rhythm and no murmurs and no lower extremity edema ABDOMEN:abdomen soft, non-tender and normal bowel sounds Musculoskeletal:no cyanosis of digits and no clubbing  PSYCH: alert & oriented x 3 with fluent speech NEURO: no focal motor/sensory deficits  LABORATORY DATA:  I have reviewed the data as listed Lab Results  Component Value Date   WBC 7.2 01/24/2010   HGB 12.6 05/30/2010   HCT 31.0* 01/24/2010   MCV 88.2 01/24/2010   PLT 223 01/24/2010   Lab Results  Component Value Date   NA 133* 01/24/2010   K 3.4* 01/24/2010   CL 103 01/24/2010   CO2 25 01/24/2010    RADIOGRAPHIC STUDIES: I have personally reviewed the radiological reports and agreed with the findings in the report. 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 clinical stage ER 7% PR 0% HER-2 positive ratio 3.84 in the reconstructed left breast  Pathology counseling: I discussed with her the details of pathology report including the difference between DCIS and invasive ductal carcinoma, comparing the previous pathology from 2002 current pathology. I discussed the ER PR and HER-2 receptors and their significance impact on adjuvant treatment options.  Recommendation: Neoadjuvant chemotherapy with Taxotere, carboplatin, Herceptin, Perjeta every 3 weeks for 6 cycles followed by surgery by adjuvant Herceptin with antiestrogen therapy with tamoxifen for 10 years  Chemotherapy counseling: I discussed this and benefits of chemotherapy risk of nausea  vomiting, hair loss, neuropathy, risk of infection, fatigue and weight loss and cytopenias, liver and kidney abnormalities, Herceptin and Perjeta related cardiac toxicities, Perjeta related diarrhea complication. Patient understands these risks and is willing to proceed.  Plan: Port placement, chemotherapy, echocardiogram will be ordered: Patient presented at tumor board tomorrow to come up with a multidisciplinary treatment plan.   All questions were answered. The patient knows to call the clinic with any problems, questions or concerns. I spent 40 minutes counseling the patient face to face. The total time spent in the appointment was 60 minutes and more than 50% was on counseling.     Rulon Eisenmenger, MD 09/20/2014 2:26 PM

## 2014-09-20 NOTE — Assessment & Plan Note (Signed)
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 clinical stage ER 7% PR 0% HER-2 positive ratio 3.84 in the reconstructed left breast  Pathology counseling: I discussed with her the details of pathology report including the difference between DCIS and invasive ductal carcinoma, comparing the previous pathology from 2002 current pathology. I discussed the ER PR and HER-2 receptors and their significance impact on adjuvant treatment options.  Recommendation: Neoadjuvant chemotherapy with Taxotere, carboplatin, Herceptin, Perjeta every 3 weeks for 6 cycles followed by surgery by adjuvant Herceptin with antiestrogen therapy with tamoxifen for 10 years  Chemotherapy counseling: I discussed this and benefits of chemotherapy risk of nausea vomiting, hair loss, neuropathy, risk of infection, fatigue and weight loss and cytopenias, liver and kidney abnormalities, Herceptin and Perjeta related cardiac toxicities, Perjeta related diarrhea complication. Patient understands these risks and is willing to proceed.  Plan: Port placement, chemotherapy, echocardiogram will be ordered: Patient presented at tumor board tomorrow to come up with a multidisciplinary treatment plan.

## 2014-09-21 ENCOUNTER — Telehealth: Payer: Self-pay | Admitting: *Deleted

## 2014-09-21 NOTE — Telephone Encounter (Signed)
Per staff message and POF I have scheduled appts. Advised scheduler of appts. JMW  

## 2014-09-22 ENCOUNTER — Ambulatory Visit (HOSPITAL_BASED_OUTPATIENT_CLINIC_OR_DEPARTMENT_OTHER): Payer: BC Managed Care – PPO | Admitting: Genetic Counselor

## 2014-09-22 ENCOUNTER — Encounter (HOSPITAL_COMMUNITY): Payer: Self-pay | Admitting: Pharmacy Technician

## 2014-09-22 ENCOUNTER — Encounter: Payer: Self-pay | Admitting: Genetic Counselor

## 2014-09-22 ENCOUNTER — Other Ambulatory Visit: Payer: BC Managed Care – PPO

## 2014-09-22 ENCOUNTER — Other Ambulatory Visit: Payer: Self-pay | Admitting: Radiology

## 2014-09-22 DIAGNOSIS — Z803 Family history of malignant neoplasm of breast: Secondary | ICD-10-CM

## 2014-09-22 DIAGNOSIS — C50412 Malignant neoplasm of upper-outer quadrant of left female breast: Secondary | ICD-10-CM

## 2014-09-22 DIAGNOSIS — Z315 Encounter for genetic counseling: Secondary | ICD-10-CM

## 2014-09-22 DIAGNOSIS — Z853 Personal history of malignant neoplasm of breast: Secondary | ICD-10-CM

## 2014-09-22 NOTE — Progress Notes (Signed)
Patient Name: Aimee Poole Patient Age: 43 y.o. Encounter Date: 09/22/2014  Referring Physician: Rolm Bookbinder, MD    Aimee Poole, a 43 y.o. female, is being seen at the [facility] due to a personal and family history of breast cancer. She presents to clinic today to discuss the possibility of a hereditary predisposition to cancer and discuss whether genetic testing is warranted.  HISTORY OF PRESENT ILLNESS: Aimee Poole was diagnosed with left breast cancer in 2010 at the age of 27. She had a left breast mastectomy.  Recently, she was diagnosed again with left breast cancer again which is a suspected recurrence. The breast tumor was ER 7%, PR 0%, and HER2 negative. The plan is for neoadjuvant chemotherapy.   Past Medical History  Diagnosis Date  . Cancer     left breast dcis  . Family history of breast cancer     Past Surgical History  Procedure Laterality Date  . Mastectomy  2010  . Breast reconstruction  2011  . Breast reduction surgery  2011  . Ectopic pregnancy surgery    . Cystectomy      ovary as well as scar tissue    History   Social History  . Marital Status: Married    Spouse Name: N/A    Number of Children: N/A  . Years of Education: N/A   Social History Main Topics  . Smoking status: Never Smoker   . Smokeless tobacco: Not on file  . Alcohol Use: No  . Drug Use: No  . Sexual Activity: Not on file   Other Topics Concern  . Not on file   Social History Narrative  . No narrative on file     FAMILY HISTORY:   During the visit, a 4-generation pedigree was obtained. Family tree will be sent for scanning and will be in EPIC under the Media tab.  Significant diagnoses include the following:  Family History  Problem Relation Age of Onset  . Breast cancer Maternal Aunt 63    currently 15  . Other Mother     TAH/BSO at 13  . Colon cancer Maternal Uncle     deceased 4s    Additionally, Aimee Poole has a daughter (age 58) and a  brother (age 90). Her sister died of a gunshot wound at 11 and had 3 children. Aimee Poole father is 73. He had only one brother and not much information is known about other paternal relatives.  Aimee Poole ancestry is African American. There is no known Jewish ancestry and no consanguinity.  ASSESSMENT AND PLAN: Aimee Poole is a 43 y.o. female with a personal and family history of breast cancer as noted above. This history is not highly suggestive of a hereditary predisposition to cancer, but given her young age at initial diagnosis and paucity of women in her father's generation, genetic testing is recommended. We reviewed the characteristics, features and inheritance patterns of hereditary cancer syndromes. We also discussed genetic testing, including the process of testing, insurance coverage and implications of results. A negative test will be reassuring.  Aimee Poole wished to pursue genetic testing and a blood sample will be sent to [facility] for analysis of the 17 genes on the BreastNext gene panel. We discussed the implications of a positive, negative and/ or Variant of Uncertain Significance (VUS) result. Results should be available in approximately 4-5 weeks, at which point we will contact her and address implications for her as well as address genetic  testing for at-risk family members, if needed.    We encouraged Aimee Poole to remain in contact with Cancer Genetics annually so that we can update the family history and inform her of any changes in cancer genetics and testing that may be of benefit for this family. Ms.  Leach questions were answered to her satisfaction today.   Thank you for the referral and allowing Aimee Poole to share in the care of your patient.   The patient was seen for a total of 30 minutes, greater than 50% of which was spent face-to-face counseling. This patient was discussed with the overseeing provider who agrees with the above.   Steele Berg, MS, [place] Certified  Genetic Counselor phone: [phone] [email or web address]

## 2014-09-23 ENCOUNTER — Other Ambulatory Visit: Payer: Self-pay | Admitting: Hematology and Oncology

## 2014-09-23 ENCOUNTER — Encounter: Payer: Self-pay | Admitting: Radiation Oncology

## 2014-09-23 ENCOUNTER — Ambulatory Visit (HOSPITAL_COMMUNITY)
Admission: RE | Admit: 2014-09-23 | Discharge: 2014-09-23 | Disposition: A | Discharge status: Home / Self Care | Payer: BC Managed Care – PPO | Source: Ambulatory Visit | Attending: Hematology and Oncology | Admitting: Hematology and Oncology

## 2014-09-23 DIAGNOSIS — C50912 Malignant neoplasm of unspecified site of left female breast: Secondary | ICD-10-CM | POA: Insufficient documentation

## 2014-09-23 DIAGNOSIS — Z452 Encounter for adjustment and management of vascular access device: Secondary | ICD-10-CM | POA: Diagnosis not present

## 2014-09-23 DIAGNOSIS — C50412 Malignant neoplasm of upper-outer quadrant of left female breast: Secondary | ICD-10-CM

## 2014-09-23 DIAGNOSIS — Z803 Family history of malignant neoplasm of breast: Secondary | ICD-10-CM | POA: Insufficient documentation

## 2014-09-23 LAB — CBC
HCT: 38 % (ref 36.0–46.0)
HEMOGLOBIN: 12.9 g/dL (ref 12.0–15.0)
MCH: 28.5 pg (ref 26.0–34.0)
MCHC: 33.9 g/dL (ref 30.0–36.0)
MCV: 83.9 fL (ref 78.0–100.0)
Platelets: 333 10*3/uL (ref 150–400)
RBC: 4.53 MIL/uL (ref 3.87–5.11)
RDW: 12.4 % (ref 11.5–15.5)
WBC: 4.6 10*3/uL (ref 4.0–10.5)

## 2014-09-23 LAB — BASIC METABOLIC PANEL
Anion gap: 12 (ref 5–15)
BUN: 8 mg/dL (ref 6–23)
CHLORIDE: 102 meq/L (ref 96–112)
CO2: 22 meq/L (ref 19–32)
Calcium: 10.3 mg/dL (ref 8.4–10.5)
Creatinine, Ser: 0.63 mg/dL (ref 0.50–1.10)
GFR calc Af Amer: >90 mL/min (ref >90)
GFR calc non Af Amer: >90 mL/min (ref >90)
GLUCOSE: 89 mg/dL (ref 70–99)
Potassium: 4.4 mEq/L (ref 3.7–5.3)
Sodium: 136 mEq/L — ABNORMAL LOW (ref 137–147)

## 2014-09-23 LAB — PROTIME-INR
INR: 0.97 (ref 0.00–1.49)
Prothrombin Time: 12.9 seconds (ref 11.6–15.2)

## 2014-09-23 LAB — APTT: aPTT: 29 seconds (ref 24–37)

## 2014-09-23 MED ORDER — CEFAZOLIN SODIUM-DEXTROSE 2-3 GM-% IV SOLR
2.0000 g | Freq: Once | INTRAVENOUS | Status: AC
  Administered 2014-09-23: 2 g via INTRAVENOUS

## 2014-09-23 MED ORDER — SODIUM CHLORIDE 0.9 % IV SOLN
Freq: Once | INTRAVENOUS | Status: AC
  Administered 2014-09-23: 12:00:00 via INTRAVENOUS

## 2014-09-23 MED ORDER — MIDAZOLAM HCL 2 MG/2ML IJ SOLN
INTRAMUSCULAR | Status: AC | PRN
Start: 2014-09-23 — End: 2014-09-23
  Administered 2014-09-23 (×2): 0.5 mg via INTRAVENOUS
  Administered 2014-09-23: 1 mg via INTRAVENOUS
  Administered 2014-09-23 (×3): 0.5 mg via INTRAVENOUS

## 2014-09-23 MED ORDER — MIDAZOLAM HCL 2 MG/2ML IJ SOLN
INTRAMUSCULAR | Status: AC
  Filled 2014-09-23: qty 6

## 2014-09-23 MED ORDER — FENTANYL CITRATE 0.05 MG/ML IJ SOLN
INTRAMUSCULAR | Status: AC
  Filled 2014-09-23: qty 6

## 2014-09-23 MED ORDER — LIDOCAINE HCL 1 % IJ SOLN
INTRAMUSCULAR | Status: AC
  Filled 2014-09-23: qty 20

## 2014-09-23 MED ORDER — HEPARIN SOD (PORK) LOCK FLUSH 100 UNIT/ML IV SOLN
INTRAVENOUS | Status: AC
  Filled 2014-09-23: qty 5

## 2014-09-23 MED ORDER — FENTANYL CITRATE 0.05 MG/ML IJ SOLN
INTRAMUSCULAR | Status: AC | PRN
  Administered 2014-09-23: 50 ug via INTRAVENOUS
  Administered 2014-09-23 (×3): 25 ug via INTRAVENOUS

## 2014-09-23 MED ORDER — HEPARIN SOD (PORK) LOCK FLUSH 100 UNIT/ML IV SOLN
INTRAVENOUS | Status: AC | PRN
  Administered 2014-09-23: 500 [IU]

## 2014-09-23 MED ORDER — CEFAZOLIN SODIUM-DEXTROSE 2-3 GM-% IV SOLR
INTRAVENOUS | Status: AC
  Administered 2014-09-23: 2 g via INTRAVENOUS
  Filled 2014-09-23: qty 50

## 2014-09-23 NOTE — Discharge Instructions (Signed)
Implanted Port Insertion, Care After  [facility]  [phone]  Refer to this sheet in the next few weeks. These instructions provide you with information on caring for yourself after your procedure. Your health care provider may also give you more specific instructions. Your treatment has been planned according to current medical practices, but problems sometimes occur. Call your health care provider if you have any problems or questions after your procedure. WHAT TO EXPECT AFTER THE PROCEDURE After your procedure, it is typical to have the following:   Discomfort at the port insertion site. Ice packs to the area will help.  Bruising on the skin over the port. This will subside in 3-4 days. HOME CARE INSTRUCTIONS  After your port is placed, you will get a manufacturer's information card. The card has information about your port. Keep this card with you at all times.   Know what kind of port you have. There are many types of ports available.   Wear a medical alert bracelet in case of an emergency. This can help alert health care workers that you have a port.   The port can stay in for as long as your health care provider believes it is necessary.   A home health care nurse may give medicines and take care of the port.   You or a family member can get special training and directions for giving medicine and taking care of the port at home.  SEEK MEDICAL CARE IF:   Your port does not flush or you are unable to get a blood return.   You have a fever or chills. SEEK IMMEDIATE MEDICAL CARE IF:  You have new fluid or pus coming from your incision.   You notice a bad smell coming from your incision site.   You have swelling, pain, or more redness at the incision or port site.   You have chest pain or shortness of breath. Document Released: 09/01/2013 Document Revised: 11/16/2013 Document Reviewed: 09/01/2013 [facility] Patient Information 2015 [place], [place]. This  information is not intended to replace advice given to you by your health care provider. Make sure you discuss any questions you have with your health care provider. Implanted [facility] Guide An implanted port is a type of central line that is placed under the skin. Central lines are used to provide IV access when treatment or nutrition needs to be given through a person's veins. Implanted ports are used for long-term IV access. An implanted port may be placed because:   You need IV medicine that would be irritating to the small veins in your hands or arms.   You need long-term IV medicines, such as antibiotics.   You need IV nutrition for a long period.   You need frequent blood draws for lab tests.   You need dialysis.  Implanted ports are usually placed in the chest area, but they can also be placed in the upper arm, the abdomen, or the leg. An implanted port has two main parts:   Reservoir. The reservoir is round and will appear as a small, raised area under your skin. The reservoir is the part where a needle is inserted to give medicines or draw blood.   Catheter. The catheter is a thin, flexible tube that extends from the reservoir. The catheter is placed into a large vein. Medicine that is inserted into the reservoir goes into the catheter and then into the vein.  HOW WILL I CARE FOR MY INCISION SITE? Do not  get the incision site wet. Bathe or shower as directed by your health care provider.  HOW IS MY PORT ACCESSED? Special steps must be taken to access the port:   Before the port is accessed, a numbing cream can be placed on the skin. This helps numb the skin over the port site.   Your health care provider uses a sterile technique to access the port.  Your health care provider must put on a mask and sterile gloves.  The skin over your port is cleaned carefully with an antiseptic and allowed to dry.  The port is gently pinched between sterile gloves, and a needle is  inserted into the port.  Only "non-coring" port needles should be used to access the port. Once the port is accessed, a blood return should be checked. This helps ensure that the port is in the vein and is not clogged.   If your port needs to remain accessed for a constant infusion, a clear (transparent) bandage will be placed over the needle site. The bandage and needle will need to be changed every week, or as directed by your health care provider.   Keep the bandage covering the needle clean and dry. Do not get it wet. Follow your health care provider's instructions on how to take a shower or bath while the port is accessed.   If your port does not need to stay accessed, no bandage is needed over the port.  WHAT IS FLUSHING? Flushing helps keep the port from getting clogged. Follow your health care provider's instructions on how and when to flush the port. Ports are usually flushed with saline solution or a medicine called heparin. The need for flushing will depend on how the port is used.   If the port is used for intermittent medicines or blood draws, the port will need to be flushed:   After medicines have been given.   After blood has been drawn.   As part of routine maintenance.   If a constant infusion is running, the port may not need to be flushed.  HOW LONG WILL MY PORT STAY IMPLANTED? The port can stay in for as long as your health care provider thinks it is needed. When it is time for the port to come out, surgery will be done to remove it. The procedure is similar to the one performed when the port was put in.  WHEN SHOULD I SEEK IMMEDIATE MEDICAL CARE? When you have an implanted port, you should seek immediate medical care if:   You notice a bad smell coming from the incision site.   You have swelling, redness, or drainage at the incision site.   You have more swelling or pain at the port site or the surrounding area.   You have a fever that is not  controlled with medicine. Document Released: 11/11/2005 Document Revised: 09/01/2013 Document Reviewed: 07/19/2013 [facility] Patient Information 2015 [place], [place]. This information is not intended to replace advice given to you by your health care provider. Make sure you discuss any questions you have with your health care provider. Conscious Sedation Sedation is the use of medicines to promote relaxation and relieve discomfort and anxiety. Conscious sedation is a type of sedation. Under conscious sedation you are less alert than normal but are still able to respond to instructions or stimulation. Conscious sedation is used during short medical and dental procedures. It is milder than deep sedation or general anesthesia and allows you to return to your regular activities  sooner.  LET [facility] CARE PROVIDER KNOW ABOUT:   Any allergies you have.  All medicines you are taking, including vitamins, herbs, eye drops, creams, and over-the-counter medicines.  Use of steroids (by mouth or creams).  Previous problems you or members of your family have had with the use of anesthetics.  Any blood disorders you have.  Previous surgeries you have had.  Medical conditions you have.  Possibility of pregnancy, if this applies.  Use of cigarettes, alcohol, or illegal drugs. RISKS AND COMPLICATIONS Generally, this is a safe procedure. However, as with any procedure, problems can occur. Possible problems include:  Oversedation.  Trouble breathing on your own. You may need to have a breathing tube until you are awake and breathing on your own.  Allergic reaction to any of the medicines used for the procedure. BEFORE THE PROCEDURE  You may have blood tests done. These tests can help show how well your kidneys and liver are working. They can also show how well your blood clots.  A physical exam will be done.  Only take medicines as directed by your health care provider. You may need to stop  taking medicines (such as blood thinners, aspirin, or nonsteroidal anti-inflammatory drugs) before the procedure.   Do not eat or drink at least 6 hours before the procedure or as directed by your health care provider.  Arrange for a responsible adult, family member, or friend to take you home after the procedure. He or she should stay with you for at least 24 hours after the procedure, until the medicine has worn off. PROCEDURE   An intravenous (IV) catheter will be inserted into one of your veins. Medicine will be able to flow directly into your body through this catheter. You may be given medicine through this tube to help prevent pain and help you relax.  The medical or dental procedure will be done. AFTER THE PROCEDURE  You will stay in a recovery area until the medicine has worn off. Your blood pressure and pulse will be checked.   Depending on the procedure you had, you may be allowed to go home when you can tolerate liquids and your pain is under control. Document Released: 08/06/2001 Document Revised: 11/16/2013 Document Reviewed: 07/19/2013 [facility] Patient Information 2015 [place], [place]. This information is not intended to replace advice given to you by your health care provider. Make sure you discuss any questions you have with your health care provider.

## 2014-09-23 NOTE — Procedures (Signed)
Procedure:  Porta-cath placement Access:  Right IJ vein Findings:  Cath tip at cavoatrial junction.  No PTX.  OK to use.

## 2014-09-23 NOTE — H&P (Signed)
Chief Complaint: "I'm here to get a port placed"  Referring Physician(s): Gudena,Vinay K  History of Present Illness: Aimee Poole is a 43 y.o. 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  Past Medical History  Diagnosis Date  . Cancer     left breast dcis  . Family history of breast cancer     Past Surgical History  Procedure Laterality Date  . Mastectomy  2010  . Breast reconstruction  2011  . Breast reduction surgery  2011  . Ectopic pregnancy surgery    . Cystectomy      ovary as well as scar tissue  . Ultrasound of left breast  09/09/2014    Hypoechoic mass  . Initial biopsy  09/12/2014    Invasive ductal ca    Allergies: Corn-containing products  Medications: Prior to Admission medications   Not on File    Family History  Problem Relation Age of Onset  . Breast cancer Maternal Aunt 68    currently 1  . Other Mother     TAH/BSO at 65  . Colon cancer Maternal Uncle     deceased 65s    History   Social History  . Marital Status: Married    Spouse Name: N/A    Number of Children: N/A  . Years of Education: N/A   Social History Main Topics  . Smoking status: Never Smoker   . Smokeless tobacco: Not on file  . Alcohol Use: No  . Drug Use: No  . Sexual Activity: Not on file   Other Topics Concern  . Not on file   Social History Narrative  . No narrative on file        Review of Systems  Constitutional: Negative for fever and chills.  Respiratory: Negative for cough and shortness of breath.   Cardiovascular: Negative for chest pain.  Gastrointestinal: Negative for nausea, vomiting, abdominal pain and blood in stool.  Genitourinary: Negative for dysuria and hematuria.  Musculoskeletal: Negative for back pain.  Neurological: Negative for headaches.  Hematological: Does not bruise/bleed  easily.  Psychiatric/Behavioral: The patient is nervous/anxious.     Vital Signs: BP 129/78  Pulse 77  Temp(Src) 97.7 F (36.5 C) (Oral)  Resp 16  SpO2 100%  LMP 09/13/2014  Physical Exam  Constitutional: She is oriented to person, place, and time. She appears well-developed and well-nourished.  Cardiovascular: Normal rate and regular rhythm.   Pulmonary/Chest: Effort normal and breath sounds normal.  Abdominal: Soft. Bowel sounds are normal. There is no tenderness.  obese  Musculoskeletal: Normal range of motion. She exhibits no edema.  Neurological: She is alert and oriented to person, place, and time.    Imaging: Mr Breast Bilateral W Wo Contrast  09/20/2014   CLINICAL DATA:  Left breast cancer diagnosed 2010 manifesting as mammographically detected calcifications, with subsequent left mastectomy and tram flap reconstruction. The patient recently reported indentation and left lateral tram flap tenderness and further evaluation demonstrates biopsy-proven invasive ductal carcinoma within the left tram flap.  LABS:  Not applicable  EXAM: BILATERAL BREAST MRI WITH AND WITHOUT CONTRAST  TECHNIQUE: Multiplanar, multisequence MR images of both breasts were obtained prior to and following the intravenous administration of 38m of MultiHance.  THREE-DIMENSIONAL MR IMAGE RENDERING ON INDEPENDENT WORKSTATION:  Three-dimensional MR images were rendered by post-processing of the original MR data  on an independent workstation. The three-dimensional MR images were interpreted, and findings are reported in the following complete MRI report for this study. Three dimensional images were evaluated at the independent DynaCad workstation  COMPARISON:  Previous exams  FINDINGS: Breast composition: c:  Heterogeneous fibroglandular tissue  Background parenchymal enhancement: Mild  Right breast: No mass or abnormal enhancement.  Left breast: Left tram flap reconstruction reidentified. There is a lobulated  irregular mass within the left lateral far posterior tram flap with internal clip artifact measuring 4.0 x 3.9 x 1.6 cm. This mass demonstrates central necrosis with peripheral enhancement and areas of wash-in/washout type enhancement kinetics. This corresponds to the biopsy-proven malignancy. The mass directly abuts the adjacent pectoralis minor muscle but no intramuscular enhancement is identified.  Lymph nodes: No abnormal appearing lymph nodes.  Ancillary findings:  None.  IMPRESSION: 4.0 cm irregular enhancing mass left lateral tram flap corresponding to the biopsy-proven invasive ductal carcinoma. No MRI evidence for disease elsewhere in the left reconstructed breast or right breast.  RECOMMENDATION: Treatment plan  BI-RADS CATEGORY  6: Known biopsy-proven malignancy.   Electronically Signed   By: Conchita Paris M.D.   On: 09/20/2014 10:38   Mm Digital Diagnostic Unilat L  09/12/2014   CLINICAL DATA:  Status post ultrasound-guided core biopsy of mass within the left tram flap.  EXAM: DIAGNOSTIC LEFT MAMMOGRAM POST ULTRASOUND BIOPSY  COMPARISON:  Previous exams  FINDINGS: Mammographic images were obtained following ultrasound guided biopsy of mass within the left tram flap. A coil shaped clip is identified within the left tram flap as expected.  IMPRESSION: Tissue marker clip is in expected location following biopsy.  Final Assessment: Post Procedure Mammograms for Marker Placement   Electronically Signed   By: Shon Hale M.D.   On: 09/12/2014 10:11   US Breast Ltd Uni Left Inc Axilla  09/13/2014   CLINICAL DATA:  Patient has noted indentation in the upper portion of the left tram flap as well as focal tenderness in the far lateral portion of the left tram flap. No complaints regarding the right breast.  EXAM: DIGITAL DIAGNOSTIC BILATERAL MAMMOGRAM WITH 3D TOMOSYNTHESIS WITH CAD  ULTRASOUND LEFT BREAST  COMPARISON:  03/12/2013 and earlier  ACR Breast Density Category b: There are scattered areas of  fibroglandular density.  FINDINGS: In the right breast No mass, distortion, or suspicious microcalcifications are identified. The initial images of the left tram flap are unremarkable.  Mammographic images were processed with CAD.  On physical exam, I palpate no abnormality in the upper portion of the left reconstructed breast. There is focal fullness and slight tenderness in the far lateral aspect of the left tram flap, along the anterior axillary line.  Ultrasound is performed, showing normal appearing fibroglandular tissue in the upper portion of the reconstructed left breast. In the far lateral portion of the reconstructed left breast there is a hypoechoic mass measuring 1.6 x 4.4 x 3.3 cm. No associated internal blood flow is identified on Doppler evaluation. Evaluation of the left axilla is negative for adenopathy. The patient reports that no nodes were positive on axillary node evaluation. A BB is placed over this hypoechoic mass for a spot tangential mammographic view. This view demonstrates a discrete mass measuring at least 3.3 cm and only partially imaged, deep against the chest wall.  IMPRESSION: 1. Suspicious mass in the far lateral aspect of the reconstructed left breast. Tissue diagnosis is recommended. 2. Negative right breast.  RECOMMENDATION: Ultrasound-guided core biopsy of the left breast  is recommended and scheduled for the patient on 09/12/2014.  I have discussed the findings and recommendations with the patient. Results were also provided in writing at the conclusion of the visit. If applicable, a reminder letter will be sent to the patient regarding the next appointment.  BI-RADS CATEGORY  4: Suspicious.   Electronically Signed   By: Shon Hale M.D.   On: 09/09/2014 13:10   Mm Diag Breast Tomo Bilateral  09/09/2014   CLINICAL DATA:  Patient has noted indentation in the upper portion of the left tram flap as well as focal tenderness in the far lateral portion of the left tram flap. No  complaints regarding the right breast.  EXAM: DIGITAL DIAGNOSTIC BILATERAL MAMMOGRAM WITH 3D TOMOSYNTHESIS WITH CAD  ULTRASOUND LEFT BREAST  COMPARISON:  03/12/2013 and earlier  ACR Breast Density Category b: There are scattered areas of fibroglandular density.  FINDINGS: In the right breast No mass, distortion, or suspicious microcalcifications are identified. The initial images of the left tram flap are unremarkable.  Mammographic images were processed with CAD.  On physical exam, I palpate no abnormality in the upper portion of the left reconstructed breast. There is focal fullness and slight tenderness in the far lateral aspect of the left tram flap, along the anterior axillary line.  Ultrasound is performed, showing normal appearing fibroglandular tissue in the upper portion of the reconstructed left breast. In the far lateral portion of the reconstructed left breast there is a hypoechoic mass measuring 1.6 x 4.4 x 3.3 cm. No associated internal blood flow is identified on Doppler evaluation. Evaluation of the left axilla is negative for adenopathy. The patient reports that no nodes were positive on axillary node evaluation. A BB is placed over this hypoechoic mass for a spot tangential mammographic view. This view demonstrates a discrete mass measuring at least 3.3 cm and only partially imaged, deep against the chest wall.  IMPRESSION: 1. Suspicious mass in the far lateral aspect of the reconstructed left breast. Tissue diagnosis is recommended. 2. Negative right breast.  RECOMMENDATION: Ultrasound-guided core biopsy of the left breast is recommended and scheduled for the patient on 09/12/2014.  I have discussed the findings and recommendations with the patient. Results were also provided in writing at the conclusion of the visit. If applicable, a reminder letter will be sent to the patient regarding the next appointment.  BI-RADS CATEGORY  4: Suspicious.   Electronically Signed   By: Shon Hale M.D.   On:  09/09/2014 13:10   [place] Lt Breast Bx W Loc Dev 1st Lesion Img Bx Spec US Guide  09/13/2014   ADDENDUM REPORT: 09/13/2014 12:11  ADDENDUM: Pathology revealed grade III invasive ductal carcinoma in the left tram flap. This was found to be concordant by Dr. Shon Hale. Pathology was discussed with the patient and her husband by telephone. She reported doing well after the biopsy. Post biopsy instructions were reviewed and her questions were answered. Surgical consultation has been scheduled with Dr. Rolm Bookbinder on September 16, 2014. A bilateral breast MRI has been scheduled for September 20, 2014. My number was provided for future questions and concerns.  Pathology results reported by Susa Raring RN, BSN on September 13, 2014.   Electronically Signed   By: Shon Hale M.D.   On: 09/13/2014 12:11   09/13/2014   CLINICAL DATA:  The patient returns for ultrasound-guided core biopsy of mass within the left tram flap.  EXAM: ULTRASOUND GUIDED LEFT BREAST CORE NEEDLE BIOPSY WITH VACUUM  ASSIST  COMPARISON:  Previous exams.  PROCEDURE: I met with the patient and we discussed the procedure of ultrasound-guided biopsy, including benefits and alternatives. We discussed the high likelihood of a successful procedure. We discussed the risks of the procedure including infection, bleeding, tissue injury, clip migration, and inadequate sampling. Informed written consent was given. The usual time-out protocol was performed immediately prior to the procedure.  Using sterile technique and 2% Lidocaine as local anesthetic, under direct ultrasound visualization, a 12 gauge vacuum-assisteddevice was used to perform biopsy of mass within the left tram flap using a lateral approach. At the conclusion of the procedure, a coil shaped tissue marker clip was deployed into the biopsy cavity. Follow-up 2-view mammogram was performed and dictated separately.  IMPRESSION: Ultrasound-guided biopsy of mass within the left tram flap. No apparent  complications.  Electronically Signed: By: Shon Hale M.D. On: 09/12/2014 09:47    Labs:  CBC:  Recent Labs  09/23/14 1140  WBC 4.6  HGB 12.9  HCT 38.0  PLT 333    COAGS:  Recent Labs  09/23/14 1140  INR 0.97  APTT 29    BMP:  Recent Labs  09/23/14 1140  NA 136*  K 4.4  CL 102  CO2 22  GLUCOSE 89  BUN 8  CALCIUM 10.3  CREATININE 0.63  GFRNONAA >90  GFRAA >90    LIVER FUNCTION TESTS: No results found for this basename: BILITOT, AST, ALT, ALKPHOS, PROT, ALBUMIN,  in the last 8760 hours  TUMOR MARKERS: No results found for this basename: AFPTM, CEA, CA199, CHROMGRNA,  in the last 8760 hours  Assessment and Plan: Aimee Poole is a 43 y.o. 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 Details/risks of procedure d/w pt/family with their understanding and consent.          Signed: Autumn Messing 09/23/2014, 12:27 PM

## 2014-09-23 NOTE — H&P (Signed)
Agree.  Patient seen.  For porta-cath placement today.

## 2014-09-26 ENCOUNTER — Encounter: Payer: Self-pay | Admitting: Hematology and Oncology

## 2014-09-26 ENCOUNTER — Telehealth: Payer: Self-pay | Admitting: Hematology and Oncology

## 2014-09-26 NOTE — Telephone Encounter (Signed)
, °

## 2014-09-26 NOTE — Progress Notes (Signed)
Put husband's fmla form on nurse's desk. °

## 2014-09-28 ENCOUNTER — Ambulatory Visit
Admission: RE | Admit: 2014-09-28 | Discharge: 2014-09-28 | Disposition: A | Discharge status: Home / Self Care | Payer: BC Managed Care – PPO | Source: Ambulatory Visit | Attending: Radiation Oncology | Admitting: Radiation Oncology

## 2014-09-28 ENCOUNTER — Telehealth: Payer: Self-pay

## 2014-09-28 ENCOUNTER — Encounter: Payer: Self-pay | Admitting: Radiation Oncology

## 2014-09-28 ENCOUNTER — Other Ambulatory Visit: Payer: Self-pay | Admitting: Hematology and Oncology

## 2014-09-28 VITALS — BP 130/87 | HR 92 | Temp 98.7°F | Resp 18 | Ht 61.0 in | Wt 184.8 lb

## 2014-09-28 DIAGNOSIS — Z510 Encounter for antineoplastic radiation therapy: Secondary | ICD-10-CM | POA: Diagnosis present

## 2014-09-28 DIAGNOSIS — C50412 Malignant neoplasm of upper-outer quadrant of left female breast: Secondary | ICD-10-CM | POA: Diagnosis not present

## 2014-09-28 HISTORY — DX: Malignant neoplasm of unspecified site of unspecified female breast: C50.919

## 2014-09-28 MED ORDER — LORAZEPAM 0.5 MG PO TABS: 0.5000 mg | ORAL_TABLET | Freq: Four times a day (QID) | ORAL | Status: DC | PRN

## 2014-09-28 MED ORDER — DEXAMETHASONE 4 MG PO TABS: 8.0000 mg | ORAL_TABLET | Freq: Two times a day (BID) | ORAL | Status: DC

## 2014-09-28 MED ORDER — PROCHLORPERAZINE MALEATE 10 MG PO TABS: 10.0000 mg | ORAL_TABLET | Freq: Four times a day (QID) | ORAL | Status: DC | PRN

## 2014-09-28 MED ORDER — LIDOCAINE-PRILOCAINE 2.5-2.5 % EX CREA: TOPICAL_CREAM | CUTANEOUS | Status: DC

## 2014-09-28 MED ORDER — ONDANSETRON HCL 8 MG PO TABS: 8.0000 mg | ORAL_TABLET | Freq: Two times a day (BID) | ORAL | Status: DC

## 2014-09-28 NOTE — Progress Notes (Signed)
MD note created during office visit sent to scan.  Copy to pt.  New pt intake form sent to scan.

## 2014-09-28 NOTE — Progress Notes (Addendum)
Radiation Oncology         [phone]) [phone] ________________________________  Initial outpatient Consultation - Date: 09/28/2014   Name: JANANN BOEVE MRN: [id number]   DOB: 09/08/1971  REFERRING PHYSICIAN: Rolm Bookbinder, MD  DIAGNOSIS:    ICD-9-CM ICD-10-CM   1. Breast cancer of upper-outer quadrant of left female breast 174.4 C50.412 NM PET Image Initial (PI) Skull Base To Thigh    STAGE: Breast cancer of upper-outer quadrant of left female breast   Staging form: Breast, AJCC 7th Edition     Clinical: Stage IIA (T2, N0, cM0) - Signed by Rulon Eisenmenger, MD on 09/20/2014     Pathologic: Stage 0 (Tis, N0, cM0) - Signed by Rolm Bookbinder, MD on 03/13/2012  HISTORY OF PRESENT ILLNESS::Juwana A Seldon is a 43 y.o. female who originally had DCIS involving the left breast and underwent mastectomy in June 2010 with reconstruction with TRAM flap. Her final pathology revealed a high grade DCIS with foci of necrosis and calcifications measuring 19 x 19.5 x 7.5 cm. Margins were negative as were 2 sentinel lymph nodes. She had pain on the left side fairly continuously after her TRAM flap and requested a mammogram last year. No mammogram was recommended on the TRAM flap.  When her pain did not resolve, she requested mammogram of the left breast. It revealed an abnormality in the upper ultrasound measured 4.4 cm in size. She then underwent MRI of the breast which revealed a 4 cm mass in the left breast adjacent to the pectoralis muscle. No lymph adenopathy was seen and no lesions in the contralateral breast. She underwent a biopsy that showed invasive ductal carcinoma ER 7% PR 0% HER-2/neu amplified ratio 3.84.  She is scheduled for chemotherapy on Tuesday. She has also undergone genetic testing. Her left chest wall is still sore. She is still working but is concerned about missing more work. She has not had a PET scan.    PREVIOUS RADIATION THERAPY: No  FAMILY HISTORY:  Family History  Problem Relation  Age of Onset  . Breast cancer Maternal Aunt 14    currently 75  . Other Mother     TAH/BSO at 25  . Colon cancer Maternal Uncle     deceased 81s  . Breast cancer Cousin     SOCIAL HISTORY:  History  Substance Use Topics  . Smoking status: Never Smoker   . Smokeless tobacco: Not on file  . Alcohol Use: No    REVIEW OF SYSTEMS:  A 15 point review of systems is documented in the electronic medical record. This was obtained by the nursing staff. However, I reviewed this with the patient to discuss relevant findings and make appropriate changes.  Pertinent positives are included in the chart.   PHYSICAL EXAM:  Filed Vitals:   09/28/14 0739  BP: 130/87  Pulse: 92  Temp: 98.7 F (37.1 C)  Resp: 18  .184 lb 12.8 oz (83.825 kg). Pleasant female in no distress. No palpable abnormalities of the left breast. No palpable axillary adenopathy bilaterally. In the posterior aspect of the breast almost at the midline there is a large palpable fixed mass.   IMPRESSION: Recurrent invasive ductal carcinoma of the left chest wall/TRAM flap  PLAN:  We discussed the need to complete her staging with a PET scan which I have ordered and is scheduled for tomorrow.  We discussed her tumor biology and the use of HER2 directed therapy. We discussed the role of radiation in recurrent disease that  is confined to the locoregional areas. I recommended treatment to the tram flap and draining lymphatics (SCLV, axilla and possibly IM nodes) given the agressiveness of her recurrence and the short time interval to recurrence as well as her age.  I would not recommend radiation if she is found to have widespread metastatic disease. This discussion came as a shock to her and her husband and we spent some time discussing recurrence and the possibility of overt or occult metastatic disease.   We discussed the process of simulation the placement tattoos. We discussed possible side effects during treatment including but not  limited to skin irritation darkness and fatigue. We discussed long-term effects of treatment which are extremely unlikely but possible including damage to the lungs and ribs. We discussed the low likelihood of secondary malignancies. We discussed that radiation could have a negative cosmetic effect on her TRAM flap.  I let her know that nursing would be doing skin teaching with her.   I will see her back after chemotherapy and surgery are completed.   She has also been referred for genetic testing and I will refer her to social work for support as well.    I spent 60 minutes  face to face with the patient and more than 50% of that time was spent in counseling and/or coordination of care.   ------------------------------------------------  Thea Silversmith, MD

## 2014-09-28 NOTE — Progress Notes (Signed)
Please see the Nurse Progress Note in the MD Initial Consult Encounter for this patient. 

## 2014-09-28 NOTE — Telephone Encounter (Signed)
Returned pt call - went over frequency of chemo treatments with patient - needed for her work schedule.  Confirmed 1st chemo on the 10th.  Pt voiced understanding.

## 2014-09-28 NOTE — Progress Notes (Signed)
Location of Breast Cancer: Left breast upper-outer quadrant 4 cm , invasive ductal carcinoma Recurrent left breast cancer diagnosed in 2010  Histology per Pathology Report:09/12/2014 Diagnosis Breast, left, needle core biopsy, 2 o'clock, 30 cm left TRAM - INVASIVE DUCTAL CARCINOMA. - SEE COMMENT.  Receptor Status: ER(+), PR (+), Her2-neu (+)  Did patient present with symptoms (if so, please note symptoms) or was this found on screening mammography?:   Past/Anticipated interventions by surgeon, if any: recent biopsy; 2010 simple mastectomy, TRAM flap for left DCIS- no further therapy at that time  Past/Anticipated interventions by medical oncology, if any: Chemotherapy Dr Gudena: recomendation is for neoadjuvant chemotherapy with taxotere, carboplatin, herceptin, perjeta every 3 weeks for 6 cycles followed by surgeryand adjuvant herceptin with antiestrogen therapy with tamoxifen for 10 years.  Lymphedema issues, if any: no  Pain issues, if any: pain in upper right chest that sometimes shoots down her right arm since having port placed on 09/23/14  SAFETY ISSUES:  Prior radiation? No  Pacemaker/ICD? No  Possible current pregnancy? no  Is the patient on methotrexate: No  Current Complaints / other details: Married, 1 daughter in first grade.  Family history of breast cancer in maternal aunt, 2 maternal first cousins NKDA Allergies to corn containing products No smoking history  

## 2014-09-29 ENCOUNTER — Encounter (HOSPITAL_COMMUNITY)
Admission: RE | Admit: 2014-09-29 | Discharge: 2014-09-29 | Disposition: A | Discharge status: Home / Self Care | Payer: BC Managed Care – PPO | Source: Ambulatory Visit | Attending: Radiation Oncology | Admitting: Radiation Oncology

## 2014-09-29 ENCOUNTER — Other Ambulatory Visit: Payer: Self-pay | Admitting: Radiation Oncology

## 2014-09-29 ENCOUNTER — Other Ambulatory Visit: Payer: BC Managed Care – PPO

## 2014-09-29 ENCOUNTER — Other Ambulatory Visit (HOSPITAL_COMMUNITY): Payer: BC Managed Care – PPO

## 2014-09-29 ENCOUNTER — Encounter: Payer: Self-pay | Admitting: Hematology and Oncology

## 2014-09-29 DIAGNOSIS — C50412 Malignant neoplasm of upper-outer quadrant of left female breast: Secondary | ICD-10-CM

## 2014-09-29 LAB — GLUCOSE, CAPILLARY: Glucose-Capillary: 100 mg/dL — ABNORMAL HIGH (ref 70–99)

## 2014-09-29 MED ORDER — FLUDEOXYGLUCOSE F - 18 (FDG) INJECTION
9.2000 | Freq: Once | INTRAVENOUS | Status: AC | PRN
  Administered 2014-09-29: 9.2 via INTRAVENOUS

## 2014-09-29 NOTE — Progress Notes (Signed)
Put husband's fmla form in registration desk.

## 2014-09-30 ENCOUNTER — Other Ambulatory Visit: Payer: Self-pay | Admitting: Radiation Oncology

## 2014-09-30 ENCOUNTER — Telehealth: Payer: Self-pay | Admitting: *Deleted

## 2014-09-30 DIAGNOSIS — N83202 Unspecified ovarian cyst, left side: Secondary | ICD-10-CM

## 2014-09-30 NOTE — Telephone Encounter (Signed)
CALLED PATIENT TO INFORM OF TEST FOR 10-06-14, ARRIVAL TIME - 8:15 AM, SPOKE WITH PATIENT AND SHE IS AWARE OF THIS TEST

## 2014-10-03 ENCOUNTER — Telehealth: Payer: Self-pay

## 2014-10-03 ENCOUNTER — Ambulatory Visit (HOSPITAL_COMMUNITY)
Admission: RE | Admit: 2014-10-03 | Discharge: 2014-10-03 | Disposition: A | Discharge status: Home / Self Care | Payer: BC Managed Care – PPO | Source: Ambulatory Visit | Attending: Internal Medicine | Admitting: Internal Medicine

## 2014-10-03 ENCOUNTER — Ambulatory Visit (HOSPITAL_COMMUNITY)
Admission: RE | Admit: 2014-10-03 | Discharge: 2014-10-03 | Disposition: A | Discharge status: Home / Self Care | Payer: BC Managed Care – PPO | Source: Ambulatory Visit | Attending: Hematology and Oncology | Admitting: Hematology and Oncology

## 2014-10-03 ENCOUNTER — Other Ambulatory Visit: Payer: BC Managed Care – PPO

## 2014-10-03 DIAGNOSIS — C50412 Malignant neoplasm of upper-outer quadrant of left female breast: Secondary | ICD-10-CM | POA: Insufficient documentation

## 2014-10-03 DIAGNOSIS — Z853 Personal history of malignant neoplasm of breast: Secondary | ICD-10-CM | POA: Insufficient documentation

## 2014-10-03 DIAGNOSIS — I519 Heart disease, unspecified: Secondary | ICD-10-CM

## 2014-10-03 DIAGNOSIS — R0600 Dyspnea, unspecified: Secondary | ICD-10-CM | POA: Diagnosis present

## 2014-10-03 DIAGNOSIS — R05 Cough: Secondary | ICD-10-CM | POA: Diagnosis not present

## 2014-10-03 DIAGNOSIS — I5189 Other ill-defined heart diseases: Secondary | ICD-10-CM | POA: Diagnosis not present

## 2014-10-03 MED ORDER — ALBUTEROL SULFATE (2.5 MG/3ML) 0.083% IN NEBU
2.5000 mg | INHALATION_SOLUTION | Freq: Once | RESPIRATORY_TRACT | Status: AC
  Administered 2014-10-03: 2.5 mg via RESPIRATORY_TRACT

## 2014-10-03 MED ORDER — AZITHROMYCIN 250 MG PO TABS
ORAL_TABLET | ORAL | Status: DC
Start: 2014-10-03 — End: 2015-01-17

## 2014-10-03 MED ORDER — ALBUTEROL SULFATE (2.5 MG/3ML) 0.083% IN NEBU
INHALATION_SOLUTION | RESPIRATORY_TRACT | Status: AC
  Filled 2014-10-03: qty 3

## 2014-10-03 NOTE — Progress Notes (Signed)
Patient attended chemo education today.  Al Corpus came to RN desk reporting patient c/o shortness of breath, symptoms starting the day after port placed.  Abigail Butts called IR and they will evaluate between 3 and 4 today.  Order placed.  Dr Lindi Adie notified.   Per Dr Lindi Adie, CXR needed.  Order placed and pt sent to radiology.

## 2014-10-03 NOTE — Telephone Encounter (Signed)
[facility] - [facility] at [facility].

## 2014-10-03 NOTE — Progress Notes (Signed)
Patient ID: Aimee Poole, female   DOB: [birth year], 43 y.o.   MRN: [id number] Asked by oncology to see pt regarding dyspnea/cough following recent right IJ PAC placement (performed 09/23/14). Pt states she has had some soreness at port site along with occ cough/dyspnea since port placed. Denies fevers but does have occ chills. Vitals as follows: BP 133/75  HR 103  TEMP 98.8  O2 SATS 95% RA. CXR today reveals mild haziness to LLL,most likely atelectasis per Dr. Laurence Ferrari. Port site appears clean and dry with minimal tenderness. No erythema, fluctuance or drainage. No wheezes on chest exam, sl dim BS bases. Pt seen by Dr. Laurence Ferrari and decision made to administer albuterol nebulizer treatment today. If pt develops fever or worsening dyspnea recommend antibiotic therapy. Port a cath is ok to use. Dr. Geralyn Flash nurse notified.   Agree with note above.  Signed,  Criselda Peaches, MD

## 2014-10-03 NOTE — Progress Notes (Signed)
  Echocardiogram 2D Echocardiogram has been performed.  Darlina Sicilian M 10/03/2014, 10:48 AM

## 2014-10-03 NOTE — Telephone Encounter (Signed)
LMOVM - IR eval and xray reviewed by Dr Lindi Adie.  Port is fined to be used, will have chemo tomorrow.  [facility] called in to [facility] [place].

## 2014-10-04 ENCOUNTER — Other Ambulatory Visit (HOSPITAL_BASED_OUTPATIENT_CLINIC_OR_DEPARTMENT_OTHER): Payer: BC Managed Care – PPO

## 2014-10-04 ENCOUNTER — Ambulatory Visit (HOSPITAL_BASED_OUTPATIENT_CLINIC_OR_DEPARTMENT_OTHER): Payer: BC Managed Care – PPO | Admitting: Hematology and Oncology

## 2014-10-04 ENCOUNTER — Encounter: Payer: Self-pay | Admitting: *Deleted

## 2014-10-04 ENCOUNTER — Ambulatory Visit (HOSPITAL_BASED_OUTPATIENT_CLINIC_OR_DEPARTMENT_OTHER): Payer: BC Managed Care – PPO

## 2014-10-04 ENCOUNTER — Other Ambulatory Visit: Payer: Self-pay | Admitting: *Deleted

## 2014-10-04 ENCOUNTER — Telehealth: Payer: Self-pay | Admitting: Hematology and Oncology

## 2014-10-04 VITALS — BP 149/74 | HR 101 | Temp 98.7°F | Resp 18 | Ht 61.0 in | Wt 182.3 lb

## 2014-10-04 DIAGNOSIS — C50412 Malignant neoplasm of upper-outer quadrant of left female breast: Secondary | ICD-10-CM

## 2014-10-04 DIAGNOSIS — Z5111 Encounter for antineoplastic chemotherapy: Secondary | ICD-10-CM

## 2014-10-04 DIAGNOSIS — R0602 Shortness of breath: Secondary | ICD-10-CM

## 2014-10-04 LAB — CBC WITH DIFFERENTIAL/PLATELET
BASO%: 0 % (ref 0.0–2.0)
Basophils Absolute: 0 10*3/uL (ref 0.0–0.1)
EOS%: 0 % (ref 0.0–7.0)
Eosinophils Absolute: 0 10*3/uL (ref 0.0–0.5)
HCT: 36.9 % (ref 34.8–46.6)
HGB: 12.7 g/dL (ref 11.6–15.9)
LYMPH#: 0.7 10*3/uL — AB (ref 0.9–3.3)
LYMPH%: 5.8 % — ABNORMAL LOW (ref 14.0–49.7)
MCH: 28.9 pg (ref 25.1–34.0)
MCHC: 34.4 g/dL (ref 31.5–36.0)
MCV: 84.1 fL (ref 79.5–101.0)
MONO#: 0.2 10*3/uL (ref 0.1–0.9)
MONO%: 1.4 % (ref 0.0–14.0)
NEUT#: 10.6 10*3/uL — ABNORMAL HIGH (ref 1.5–6.5)
NEUT%: 92.8 % — ABNORMAL HIGH (ref 38.4–76.8)
Platelets: 379 10*3/uL (ref 145–400)
RBC: 4.39 10*6/uL (ref 3.70–5.45)
RDW: 12.4 % (ref 11.2–14.5)
WBC: 11.5 10*3/uL — ABNORMAL HIGH (ref 3.9–10.3)

## 2014-10-04 LAB — COMPREHENSIVE METABOLIC PANEL (CC13)
ALT: 55 U/L (ref 0–55)
ANION GAP: 7 meq/L (ref 3–11)
AST: 37 U/L — ABNORMAL HIGH (ref 5–34)
Albumin: 3.8 g/dL (ref 3.5–5.0)
Alkaline Phosphatase: 126 U/L (ref 40–150)
BILIRUBIN TOTAL: 0.38 mg/dL (ref 0.20–1.20)
BUN: 10.1 mg/dL (ref 7.0–26.0)
CO2: 20 meq/L — AB (ref 22–29)
CREATININE: 0.9 mg/dL (ref 0.6–1.1)
Calcium: 11.4 mg/dL — ABNORMAL HIGH (ref 8.4–10.4)
Chloride: 110 mEq/L — ABNORMAL HIGH (ref 98–109)
Glucose: 129 mg/dl (ref 70–140)
POTASSIUM: 4.1 meq/L (ref 3.5–5.1)
SODIUM: 138 meq/L (ref 136–145)
TOTAL PROTEIN: 8.2 g/dL (ref 6.4–8.3)

## 2014-10-04 MED ORDER — LORATADINE 10 MG PO TABS: 10.0000 mg | ORAL_TABLET | Freq: Every day | ORAL | Status: DC

## 2014-10-04 MED ORDER — SODIUM CHLORIDE 0.9 % IV SOLN: Freq: Once | INTRAVENOUS | Status: DC

## 2014-10-04 MED ORDER — ONDANSETRON 16 MG/50ML IVPB (CHCC)
16.0000 mg | Freq: Once | INTRAVENOUS | Status: AC
  Administered 2014-10-04: 16 mg via INTRAVENOUS

## 2014-10-04 MED ORDER — SODIUM CHLORIDE 0.9 % IV SOLN
8.0000 mg/kg | Freq: Once | INTRAVENOUS | Status: AC
  Administered 2014-10-04: 672 mg via INTRAVENOUS
  Filled 2014-10-04: qty 32

## 2014-10-04 MED ORDER — DEXAMETHASONE SODIUM PHOSPHATE 20 MG/5ML IJ SOLN
INTRAMUSCULAR | Status: AC
  Filled 2014-10-04: qty 5

## 2014-10-04 MED ORDER — ACETAMINOPHEN 325 MG PO TABS
ORAL_TABLET | ORAL | Status: AC
  Filled 2014-10-04: qty 2

## 2014-10-04 MED ORDER — SODIUM CHLORIDE 0.9 % IV SOLN
Freq: Once | INTRAVENOUS | Status: AC
  Administered 2014-10-04: 10:00:00 via INTRAVENOUS

## 2014-10-04 MED ORDER — ONDANSETRON 16 MG/50ML IVPB (CHCC)
INTRAVENOUS | Status: AC
  Filled 2014-10-04: qty 16

## 2014-10-04 MED ORDER — CARBOPLATIN CHEMO INJECTION 600 MG/60ML
750.0000 mg | Freq: Once | INTRAVENOUS | Status: AC
  Administered 2014-10-04: 750 mg via INTRAVENOUS
  Filled 2014-10-04: qty 75

## 2014-10-04 MED ORDER — ACETAMINOPHEN 325 MG PO TABS
650.0000 mg | ORAL_TABLET | Freq: Once | ORAL | Status: AC
  Administered 2014-10-04: 650 mg via ORAL

## 2014-10-04 MED ORDER — ACETAMINOPHEN 325 MG PO TABS: 650.0000 mg | ORAL_TABLET | Freq: Four times a day (QID) | ORAL | Status: DC | PRN

## 2014-10-04 MED ORDER — SODIUM CHLORIDE 0.9 % IJ SOLN
10.0000 mL | INTRAMUSCULAR | Status: DC | PRN
  Filled 2014-10-04: qty 10

## 2014-10-04 MED ORDER — DOCETAXEL CHEMO INJECTION 160 MG/16ML
75.0000 mg/m2 | Freq: Once | INTRAVENOUS | Status: AC
  Administered 2014-10-04: 140 mg via INTRAVENOUS
  Filled 2014-10-04: qty 14

## 2014-10-04 MED ORDER — SODIUM CHLORIDE 0.9 % IV SOLN
840.0000 mg | Freq: Once | INTRAVENOUS | Status: AC
  Administered 2014-10-04: 840 mg via INTRAVENOUS
  Filled 2014-10-04: qty 28

## 2014-10-04 MED ORDER — UNABLE TO FIND: Status: DC

## 2014-10-04 MED ORDER — DEXAMETHASONE SODIUM PHOSPHATE 20 MG/5ML IJ SOLN
20.0000 mg | Freq: Once | INTRAMUSCULAR | Status: AC
  Administered 2014-10-04: 20 mg via INTRAVENOUS

## 2014-10-04 MED ORDER — DIPHENHYDRAMINE HCL 25 MG PO CAPS
50.0000 mg | ORAL_CAPSULE | Freq: Once | ORAL | Status: AC
  Administered 2014-10-04: 50 mg via ORAL

## 2014-10-04 MED ORDER — DIPHENHYDRAMINE HCL 25 MG PO CAPS
ORAL_CAPSULE | ORAL | Status: AC
  Filled 2014-10-04: qty 2

## 2014-10-04 NOTE — Telephone Encounter (Signed)
, °

## 2014-10-04 NOTE — Progress Notes (Signed)
[place] Work  Clinical Social Work was referred by Pension scheme manager for assessment of psychosocial needs.  Clinical Social Worker met with patient in the infusion room at [facility] to offer support and assess for needs.  Patient stated she was feeling a little overwhelmed since this was her first day of treatment, but overall was doing "ok".  Patient was initially diagnosed 5 years ago, and is now in treatment for a recurrence.  CSW and patient discussed the importance of emotional support during treatment, and CSw informed patient of the support team and support services at [facility].  Patient has a 77 year old daughter and 82 year old nephew living with her.  CSW and patient also discussed support services for children and family.  CSW provided patient with a Journey Book and encouraged her to call with any questions or concerns.  CSW will continue to follow and offer support as needed.           Clinical Social Work interventions: Resource education Emotional Support    Johnnye Lana, MSW, LCSW, OSW-C Clinical Social Worker [place] [phone]

## 2014-10-04 NOTE — Patient Instructions (Addendum)
[facility] Discharge Instructions for Patients Receiving Chemotherapy  Today you received the following chemotherapy agents taxotere, carboplatin, herceptin, perjeta  To help prevent nausea and vomiting after your treatment, we encourage you to take your nausea medication as ordered.     If you develop nausea and vomiting that is not controlled by your nausea medication, call the clinic.   BELOW ARE SYMPTOMS THAT SHOULD BE REPORTED IMMEDIATELY:  *FEVER GREATER THAN 100.5 F  *CHILLS WITH OR WITHOUT FEVER  NAUSEA AND VOMITING THAT IS NOT CONTROLLED WITH YOUR NAUSEA MEDICATION  *UNUSUAL SHORTNESS OF BREATH  *UNUSUAL BRUISING OR BLEEDING  TENDERNESS IN MOUTH AND THROAT WITH OR WITHOUT PRESENCE OF ULCERS  *URINARY PROBLEMS  *BOWEL PROBLEMS  UNUSUAL RASH Items with * indicate a potential emergency and should be followed up as soon as possible.  Feel free to call the clinic you have any questions or concerns. The clinic phone number is [phone].   Trastuzumab injection for infusion (Herceptin) What is this medicine? TRASTUZUMAB (tras TOO zoo mab) is a monoclonal antibody. It targets a protein called HER2. This protein is found in some stomach and breast cancers. This medicine can stop cancer cell growth. This medicine may be used with other cancer treatments. This medicine may be used for other purposes; ask your health care provider or pharmacist if you have questions. COMMON BRAND NAME(S): Herceptin What should I tell my health care provider before I take this medicine? They need to know if you have any of these conditions: -heart disease -heart failure -infection (especially a virus infection such as chickenpox, cold sores, or herpes) -lung or breathing disease, like asthma -recent or ongoing radiation therapy -an unusual or allergic reaction to trastuzumab, benzyl alcohol, or other medications, foods, dyes, or preservatives -pregnant or trying to get  pregnant -breast-feeding How should I use this medicine? This drug is given as an infusion into a vein. It is administered in a hospital or clinic by a specially trained health care professional. Talk to your pediatrician regarding the use of this medicine in children. This medicine is not approved for use in children. Overdosage: If you think you have taken too much of this medicine contact a poison control center or emergency room at once. NOTE: This medicine is only for you. Do not share this medicine with others. What if I miss a dose? It is important not to miss a dose. Call your doctor or health care professional if you are unable to keep an appointment. What may interact with this medicine? -cyclophosphamide -doxorubicin -warfarin This list may not describe all possible interactions. Give your health care provider a list of all the medicines, herbs, non-prescription drugs, or dietary supplements you use. Also tell them if you smoke, drink alcohol, or use illegal drugs. Some items may interact with your medicine. What should I watch for while using this medicine? Visit your doctor for checks on your progress. Report any side effects. Continue your course of treatment even though you feel ill unless your doctor tells you to stop. Call your doctor or health care professional for advice if you get a fever, chills or sore throat, or other symptoms of a cold or flu. Do not treat yourself. Try to avoid being around people who are sick. You may experience fever, chills and shaking during your first infusion. These effects are usually mild and can be treated with other medicines. Report any side effects during the infusion to your health care professional. Fever and  chills usually do not happen with later infusions. What side effects may I notice from receiving this medicine? Side effects that you should report to your doctor or other health care professional as soon as possible: -breathing  difficulties -chest pain or palpitations -cough -dizziness or fainting -fever or chills, sore throat -skin rash, itching or hives -swelling of the legs or ankles -unusually weak or tired Side effects that usually do not require medical attention (report to your doctor or other health care professional if they continue or are bothersome): -loss of appetite -headache -muscle aches -nausea This list may not describe all possible side effects. Call your doctor for medical advice about side effects. You may report side effects to FDA at 1-800-FDA-1088. Where should I keep my medicine? This drug is given in a hospital or clinic and will not be stored at home. NOTE: This sheet is a summary. It may not cover all possible information. If you have questions about this medicine, talk to your doctor, pharmacist, or health care provider.  2015, Elsevier/Gold Standard. (2009-09-15 13:43:15)   Pertuzumab injection (Perjeta)  What is this medicine? PERTUZUMAB (per TOOZ ue mab) is a monoclonal antibody that targets a protein called HER2. HER2 is found in some breast cancers. This medicine can stop cancer cell growth. This medicine is used with other cancer treatments. This medicine may be used for other purposes; ask your health care provider or pharmacist if you have questions. COMMON BRAND NAME(S): PERJETA What should I tell my health care provider before I take this medicine? They need to know if you have any of these conditions: -heart disease -heart failure -high blood pressure -history of irregular heart beat -recent or ongoing radiation therapy -an unusual or allergic reaction to pertuzumab, other medicines, foods, dyes, or preservatives -pregnant or trying to get pregnant -breast-feeding How should I use this medicine? This medicine is for infusion into a vein. It is given by a health care professional in a hospital or clinic setting. Talk to your pediatrician regarding the use of this  medicine in children. Special care may be needed. Overdosage: If you think you've taken too much of this medicine contact a poison control center or emergency room at once. Overdosage: If you think you have taken too much of this medicine contact a poison control center or emergency room at once. NOTE: This medicine is only for you. Do not share this medicine with others. What if I miss a dose? It is important not to miss your dose. Call your doctor or health care professional if you are unable to keep an appointment. What may interact with this medicine? Interactions are not expected. Give your health care provider a list of all the medicines, herbs, non-prescription drugs, or dietary supplements you use. Also tell them if you smoke, drink alcohol, or use illegal drugs. Some items may interact with your medicine. This list may not describe all possible interactions. Give your health care provider a list of all the medicines, herbs, non-prescription drugs, or dietary supplements you use. Also tell them if you smoke, drink alcohol, or use illegal drugs. Some items may interact with your medicine. What should I watch for while using this medicine? Your condition will be monitored carefully while you are receiving this medicine. Report any side effects. Continue your course of treatment even though you feel ill unless your doctor tells you to stop. Do not become pregnant while taking this medicine. Women should inform their doctor if they wish to become pregnant  or think they might be pregnant. There is a potential for serious side effects to an unborn child. Talk to your health care professional or pharmacist for more information. Do not breast-feed an infant while taking this medicine. Call your doctor or health care professional for advice if you get a fever, chills or sore throat, or other symptoms of a cold or flu. Do not treat yourself. Try to avoid being around people who are sick. You may  experience fever, chills, and headache during the infusion. Report any side effects during the infusion to your health care professional. What side effects may I notice from receiving this medicine? Side effects that you should report to your doctor or health care professional as soon as possible: -breathing problems -chest pain or palpitations -dizziness -feeling faint or lightheaded -fever or chills -skin rash, itching or hives -sore throat -swelling of the face, lips, or tongue -swelling of the legs or ankles -unusually weak or tired Side effects that usually do not require medical attention (Report these to your doctor or health care professional if they continue or are bothersome.): -diarrhea -hair loss -nausea, vomiting -tiredness This list may not describe all possible side effects. Call your doctor for medical advice about side effects. You may report side effects to FDA at 1-800-FDA-1088. Where should I keep my medicine? This drug is given in a hospital or clinic and will not be stored at home. NOTE: This sheet is a summary. It may not cover all possible information. If you have questions about this medicine, talk to your doctor, pharmacist, or health care provider.  2015, Elsevier/Gold Standard. (2012-09-09 16:54:15)   Docetaxel injection (Taxotere) What is this medicine? DOCETAXEL (doe se TAX el) is a chemotherapy drug. It targets fast dividing cells, like cancer cells, and causes these cells to die. This medicine is used to treat many types of cancers like breast cancer, certain stomach cancers, head and neck cancer, lung cancer, and prostate cancer. This medicine may be used for other purposes; ask your health care provider or pharmacist if you have questions. COMMON BRAND NAME(S): Docefrez, Taxotere What should I tell my health care provider before I take this medicine? They need to know if you have any of these conditions: -infection (especially a virus infection such  as chickenpox, cold sores, or herpes) -liver disease -low blood counts, like low white cell, platelet, or red cell counts -an unusual or allergic reaction to docetaxel, polysorbate 80, other chemotherapy agents, other medicines, foods, dyes, or preservatives -pregnant or trying to get pregnant -breast-feeding How should I use this medicine? This drug is given as an infusion into a vein. It is administered in a hospital or clinic by a specially trained health care professional. Talk to your pediatrician regarding the use of this medicine in children. Special care may be needed. Overdosage: If you think you have taken too much of this medicine contact a poison control center or emergency room at once. NOTE: This medicine is only for you. Do not share this medicine with others. What if I miss a dose? It is important not to miss your dose. Call your doctor or health care professional if you are unable to keep an appointment. What may interact with this medicine? -cyclosporine -erythromycin -ketoconazole -medicines to increase blood counts like filgrastim, pegfilgrastim, sargramostim -vaccines Talk to your doctor or health care professional before taking any of these medicines: -acetaminophen -aspirin -ibuprofen -ketoprofen -naproxen This list may not describe all possible interactions. Give your health care provider  a list of all the medicines, herbs, non-prescription drugs, or dietary supplements you use. Also tell them if you smoke, drink alcohol, or use illegal drugs. Some items may interact with your medicine. What should I watch for while using this medicine? Your condition will be monitored carefully while you are receiving this medicine. You will need important blood work done while you are taking this medicine. This drug may make you feel generally unwell. This is not uncommon, as chemotherapy can affect healthy cells as well as cancer cells. Report any side effects. Continue your  course of treatment even though you feel ill unless your doctor tells you to stop. In some cases, you may be given additional medicines to help with side effects. Follow all directions for their use. Call your doctor or health care professional for advice if you get a fever, chills or sore throat, or other symptoms of a cold or flu. Do not treat yourself. This drug decreases your body's ability to fight infections. Try to avoid being around people who are sick. This medicine may increase your risk to bruise or bleed. Call your doctor or health care professional if you notice any unusual bleeding. Be careful brushing and flossing your teeth or using a toothpick because you may get an infection or bleed more easily. If you have any dental work done, tell your dentist you are receiving this medicine. Avoid taking products that contain aspirin, acetaminophen, ibuprofen, naproxen, or ketoprofen unless instructed by your doctor. These medicines may hide a fever. This medicine contains an alcohol in the product. You may get drowsy or dizzy. Do not drive, use machinery, or do anything that needs mental alertness until you know how this medicine affects you. Do not stand or sit up quickly, especially if you are an older patient. This reduces the risk of dizzy or fainting spells. Avoid alcoholic drinks Do not become pregnant while taking this medicine. Women should inform their doctor if they wish to become pregnant or think they might be pregnant. There is a potential for serious side effects to an unborn child. Talk to your health care professional or pharmacist for more information. Do not breast-feed an infant while taking this medicine. What side effects may I notice from receiving this medicine? Side effects that you should report to your doctor or health care professional as soon as possible: -allergic reactions like skin rash, itching or hives, swelling of the face, lips, or tongue -low blood counts - This  drug may decrease the number of white blood cells, red blood cells and platelets. You may be at increased risk for infections and bleeding. -signs of infection - fever or chills, cough, sore throat, pain or difficulty passing urine -signs of decreased platelets or bleeding - bruising, pinpoint red spots on the skin, black, tarry stools, nosebleeds -signs of decreased red blood cells - unusually weak or tired, fainting spells, lightheadedness -breathing problems -fast or irregular heartbeat -low blood pressure -mouth sores -nausea and vomiting -pain, swelling, redness or irritation at the injection site -pain, tingling, numbness in the hands or feet -swelling of the ankle, feet, hands -weight gain Side effects that usually do not require medical attention (report to your prescriber or health care professional if they continue or are bothersome): -bone pain -complete hair loss including hair on your head, underarms, pubic hair, eyebrows, and eyelashes -diarrhea -excessive tearing -changes in the color of fingernails -loosening of the fingernails -nausea -muscle pain -red flush to skin -sweating -weak or tired This  list may not describe all possible side effects. Call your doctor for medical advice about side effects. You may report side effects to FDA at 1-800-FDA-1088. Where should I keep my medicine? This drug is given in a hospital or clinic and will not be stored at home. NOTE: This sheet is a summary. It may not cover all possible information. If you have questions about this medicine, talk to your doctor, pharmacist, or health care provider.  2015, Elsevier/Gold Standard. (2013-10-07 22:21:02)   Carboplatin injection What is this medicine? CARBOPLATIN (KAR boe pla tin) is a chemotherapy drug. It targets fast dividing cells, like cancer cells, and causes these cells to die. This medicine is used to treat ovarian cancer and many other cancers. This medicine may be used for other  purposes; ask your health care provider or pharmacist if you have questions. COMMON BRAND NAME(S): Paraplatin What should I tell my health care provider before I take this medicine? They need to know if you have any of these conditions: -blood disorders -hearing problems -kidney disease -recent or ongoing radiation therapy -an unusual or allergic reaction to carboplatin, cisplatin, other chemotherapy, other medicines, foods, dyes, or preservatives -pregnant or trying to get pregnant -breast-feeding How should I use this medicine? This drug is usually given as an infusion into a vein. It is administered in a hospital or clinic by a specially trained health care professional. Talk to your pediatrician regarding the use of this medicine in children. Special care may be needed. Overdosage: If you think you have taken too much of this medicine contact a poison control center or emergency room at once. NOTE: This medicine is only for you. Do not share this medicine with others. What if I miss a dose? It is important not to miss a dose. Call your doctor or health care professional if you are unable to keep an appointment. What may interact with this medicine? -medicines for seizures -medicines to increase blood counts like filgrastim, pegfilgrastim, sargramostim -some antibiotics like amikacin, gentamicin, neomycin, streptomycin, tobramycin -vaccines Talk to your doctor or health care professional before taking any of these medicines: -acetaminophen -aspirin -ibuprofen -ketoprofen -naproxen This list may not describe all possible interactions. Give your health care provider a list of all the medicines, herbs, non-prescription drugs, or dietary supplements you use. Also tell them if you smoke, drink alcohol, or use illegal drugs. Some items may interact with your medicine. What should I watch for while using this medicine? Your condition will be monitored carefully while you are receiving this  medicine. You will need important blood work done while you are taking this medicine. This drug may make you feel generally unwell. This is not uncommon, as chemotherapy can affect healthy cells as well as cancer cells. Report any side effects. Continue your course of treatment even though you feel ill unless your doctor tells you to stop. In some cases, you may be given additional medicines to help with side effects. Follow all directions for their use. Call your doctor or health care professional for advice if you get a fever, chills or sore throat, or other symptoms of a cold or flu. Do not treat yourself. This drug decreases your body's ability to fight infections. Try to avoid being around people who are sick. This medicine may increase your risk to bruise or bleed. Call your doctor or health care professional if you notice any unusual bleeding. Be careful brushing and flossing your teeth or using a toothpick because you may get an  infection or bleed more easily. If you have any dental work done, tell your dentist you are receiving this medicine. Avoid taking products that contain aspirin, acetaminophen, ibuprofen, naproxen, or ketoprofen unless instructed by your doctor. These medicines may hide a fever. Do not become pregnant while taking this medicine. Women should inform their doctor if they wish to become pregnant or think they might be pregnant. There is a potential for serious side effects to an unborn child. Talk to your health care professional or pharmacist for more information. Do not breast-feed an infant while taking this medicine. What side effects may I notice from receiving this medicine? Side effects that you should report to your doctor or health care professional as soon as possible: -allergic reactions like skin rash, itching or hives, swelling of the face, lips, or tongue -signs of infection - fever or chills, cough, sore throat, pain or difficulty passing urine -signs of  decreased platelets or bleeding - bruising, pinpoint red spots on the skin, black, tarry stools, nosebleeds -signs of decreased red blood cells - unusually weak or tired, fainting spells, lightheadedness -breathing problems -changes in hearing -changes in vision -chest pain -high blood pressure -low blood counts - This drug may decrease the number of white blood cells, red blood cells and platelets. You may be at increased risk for infections and bleeding. -nausea and vomiting -pain, swelling, redness or irritation at the injection site -pain, tingling, numbness in the hands or feet -problems with balance, talking, walking -trouble passing urine or change in the amount of urine Side effects that usually do not require medical attention (report to your doctor or health care professional if they continue or are bothersome): -hair loss -loss of appetite -metallic taste in the mouth or changes in taste This list may not describe all possible side effects. Call your doctor for medical advice about side effects. You may report side effects to FDA at 1-800-FDA-1088. Where should I keep my medicine? This drug is given in a hospital or clinic and will not be stored at home. NOTE: This sheet is a summary. It may not cover all possible information. If you have questions about this medicine, talk to your doctor, pharmacist, or health care provider.  2015, Elsevier/Gold Standard. (2008-02-16 14:38:05)

## 2014-10-04 NOTE — Assessment & Plan Note (Addendum)
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 clinical stage ER 7% PR 0% HER-2 positive ratio 3.84 in the reconstructed left breast. Chemotherapy consent: The risks and benefits of chemotherapy and going through chemotherapy education class, patient is here to start chemotherapy and she signed the consent form today.  Reviewed the PET scan with her which did not show any evidence of distant metastatic disease but she had diffuse bone marrow activity which is not concerning for me as I suspect that this may be the result of bili rapidly proliferating bone marrow activity rather than malignancy.  Antiemetics regimen: I discussed antiemetics with her in great detail Shortness of breath up for port insertion: Chest x-ray obtained yesterday was reviewed which did not show any evidence of pneumothorax. There was immediate opacification suggestive of early infiltrate. He started on azithromycin since she was starting chemotherapy today. Echocardiogram: Ejection fraction 60-65% on 10/03/2014 Return to clinic in one week for toxicity check.

## 2014-10-04 NOTE — Progress Notes (Signed)
No care team member to display  DIAGNOSIS: Breast cancer of upper-outer quadrant of left female breast   Staging form: Breast, AJCC 7th Edition     Clinical: Stage IIA (T2, N0, cM0) - Signed by Rulon Eisenmenger, MD on 09/20/2014     Pathologic: Stage 0 (Tis, N0, cM0) - Signed by Rolm Bookbinder, MD on 03/13/2012   SUMMARY OF ONCOLOGIC HISTORY: Oncology History   DCIS left breast     Breast cancer of upper-outer quadrant of left female breast   05/10/2009 Initial Diagnosis DCIS left breast Treated with left mastectomy 19.5 cm DCIS ER 0% PR 0%   09/09/2014 Relapse/Recurrence Ultrasound left breast: hypoechoic mass measuring 1.6 x 4.4 x 3.3 cm  MRI breast: 4 cm mass in the left breast close to the pectoralis, no lymphadenopathy   09/12/2014 Initial Biopsy Invasive ductal carcinoma ER 7%, PR 0%, HER-2 amplified ratio 3.84 average in copy #7.3   10/04/2014 -  Chemotherapy Neoadjuvant chemotherapy with Taxotere, carboplatin, Herceptin, Perjeta x6 cycles    CHIEF COMPLIANT: today is cycle 1 day 1 of neoadjuvant chemotherapy with TCHP  INTERVAL HISTORY: Aimee Poole is a 42 year old white lady with above-mentioned history of recurrent breast cancer that was HER-2 positive. She is here today for initiation of cycle 1 of neoadjuvant chemotherapy. She had 2 education port placement. She had discomfort in the chest and came in and get a chest x-ray yesterday which showed some mild infiltrate in the left lung. Today she feels a lot better pains have improved significantly. She denies any fevers or chills. She had started dexamethasone last night.  REVIEW OF SYSTEMS:   Constitutional: Denies fevers, chills or abnormal weight loss Eyes: Denies blurriness of vision Ears, nose, mouth, throat, and face: Denies mucositis or sore throat Respiratory: Denies cough, dyspnea or wheezes Cardiovascular: Denies palpitation, chest discomfort or lower extremity swelling Gastrointestinal:  Denies nausea, heartburn or  change in bowel habits Skin: Denies abnormal skin rashes Lymphatics: Denies new lymphadenopathy or easy bruising Neurological:Denies numbness, tingling or new weaknesses Behavioral/Psych: Mood is stable, no new changes  All other systems were reviewed with the patient and are negative.  I have reviewed the past medical history, past surgical history, social history and family history with the patient and they are unchanged from previous note.  ALLERGIES:  is allergic to corn-containing products.  MEDICATIONS:  Current Outpatient Prescriptions  Medication Sig Dispense Refill  . azithromycin (ZITHROMAX Z-PAK) 250 MG tablet Take by mouth, two tables (500 mg) on the first day and one tablet (250) daily until completed. 6 each 0  . dexamethasone (DECADRON) 4 MG tablet Take 2 tablets (8 mg total) by mouth 2 (two) times daily. Start the day before Taxotere. Then again the day after chemo for 3 days. 30 tablet 1  . ibuprofen (ADVIL,MOTRIN) 100 MG chewable tablet Chew by mouth every 8 (eight) hours as needed.    . lidocaine-prilocaine (EMLA) cream Apply to affected area once 30 g 3  . lidocaine-prilocaine (EMLA) cream Apply to affected area once 30 g 3  . LORazepam (ATIVAN) 0.5 MG tablet Take 1 tablet (0.5 mg total) by mouth every 6 (six) hours as needed (Nausea or vomiting). 30 tablet 0  . ondansetron (ZOFRAN) 8 MG tablet Take 1 tablet (8 mg total) by mouth 2 (two) times daily. Start the day after chemo for 3 days. Then take as needed for nausea or vomiting. 30 tablet 1  . prochlorperazine (COMPAZINE) 10 MG tablet Take 1 tablet (10  mg total) by mouth every 6 (six) hours as needed (Nausea or vomiting). 30 tablet 1  . 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    PHYSICAL EXAMINATION: ECOG PERFORMANCE STATUS: 1 - Symptomatic but completely ambulatory  Filed  Vitals:   10/04/14 0816  BP: 149/74  Pulse: 101  Temp: 98.7 F (37.1 C)  Resp: 18   Filed Weights   10/04/14 0816  Weight: 182 lb 4.8 oz (82.691 kg)    GENERAL:alert, no distress and comfortable SKIN: skin color, texture, turgor are normal, no rashes or significant lesions EYES: normal, Conjunctiva are pink and non-injected, sclera clear OROPHARYNX:no exudate, no erythema and lips, buccal mucosa, and tongue normal  NECK: supple, thyroid normal size, non-tender, without nodularity LYMPH:  no palpable lymphadenopathy in the cervical, axillary or inguinal LUNGS: clear to auscultation and percussion with normal breathing effort HEART: regular rate & rhythm and no murmurs and no lower extremity edema ABDOMEN:abdomen soft, non-tender and normal bowel sounds Musculoskeletal:no cyanosis of digits and no clubbing  NEURO: alert & oriented x 3 with fluent speech, no focal motor/sensory deficits  LABORATORY DATA:  I have reviewed the data as listed   Chemistry      Component Value Date/Time   NA 136* 09/23/2014 1140   K 4.4 09/23/2014 1140   CL 102 09/23/2014 1140   CO2 22 09/23/2014 1140   BUN 8 09/23/2014 1140   CREATININE 0.63 09/23/2014 1140      Component Value Date/Time   CALCIUM 10.3 09/23/2014 1140   ALKPHOS 91 11/01/2009 1212   AST 22 11/01/2009 1212   ALT 22 11/01/2009 1212   BILITOT 0.6 11/01/2009 1212       Lab Results  Component Value Date   WBC 4.6 09/23/2014   HGB 12.9 09/23/2014   HCT 38.0 09/23/2014   MCV 83.9 09/23/2014   PLT 333 09/23/2014   NEUTROABS 2.9 11/01/2009     RADIOGRAPHIC STUDIES: I have personally reviewed the radiology reports and agreed with their findings. Dg Chest 2 View  10/03/2014   CLINICAL DATA:  Cough with shortness of breath. History of breast cancer.  EXAM: CHEST  2 VIEW  COMPARISON:  November 01, 2009  FINDINGS: The heart size and mediastinal contours are within normal limits. There is mild patchy opacity of lateral left lung  base. There is a minimal right pleural effusion. There is no pulmonary edema. Right central venous line is noted with distal tip in the superior vena cava. The visualized skeletal structures are unremarkable.  IMPRESSION: Mild hazy opacity in the lateral left lung base, early developing pneumonia is not excluded.   Electronically Signed   By: Abelardo Diesel M.D.   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 clinical stage ER 7% PR 0% HER-2 positive ratio 3.84 in the reconstructed left breast. Chemotherapy consent: The risks and benefits of chemotherapy and going through chemotherapy education class, patient is here to start chemotherapy and she signed the consent form today.  Reviewed the PET scan with her which did not show any evidence of distant metastatic disease but she had diffuse bone marrow activity which is not concerning for me as I suspect that this may be the result of bili  rapidly proliferating bone marrow activity rather than malignancy.  Antiemetics regimen: I discussed antiemetics with her in great detail Shortness of breath up for port insertion: Chest x-ray obtained yesterday was reviewed which did not show any evidence of pneumothorax. There was immediate opacification suggestive of early infiltrate. He started on azithromycin since she was starting chemotherapy today. Echocardiogram: Ejection fraction 60-65% on 10/03/2014 Return to clinic in one week for toxicity check.   Orders Placed This Encounter  Procedures  . CBC with Differential    Standing Status: Future     Number of Occurrences:      Standing Expiration Date: 10/04/2015  . Comprehensive metabolic panel (Cmet) - [facility]    Standing Status: Future     Number of Occurrences:      Standing Expiration Date: 10/04/2015   The patient has a good understanding of the overall plan. she agrees  with it. She will call with any problems that may develop before her next visit here.  I spent 20 minutes counseling the patient face to face. The total time spent in the appointment was 25 minutes and more than 50% was on counseling and review of test results    Rulon Eisenmenger, MD 10/04/2014 8:51 AM

## 2014-10-05 ENCOUNTER — Ambulatory Visit (HOSPITAL_BASED_OUTPATIENT_CLINIC_OR_DEPARTMENT_OTHER): Payer: BC Managed Care – PPO

## 2014-10-05 ENCOUNTER — Telehealth: Payer: Self-pay

## 2014-10-05 DIAGNOSIS — Z5189 Encounter for other specified aftercare: Secondary | ICD-10-CM

## 2014-10-05 DIAGNOSIS — C50412 Malignant neoplasm of upper-outer quadrant of left female breast: Secondary | ICD-10-CM

## 2014-10-05 MED ORDER — PEGFILGRASTIM INJECTION 6 MG/0.6ML ~~LOC~~
6.0000 mg | PREFILLED_SYRINGE | Freq: Once | SUBCUTANEOUS | Status: AC
  Administered 2014-10-05: 6 mg via SUBCUTANEOUS
  Filled 2014-10-05: qty 0.6

## 2014-10-05 NOTE — Patient Instructions (Signed)
Pegfilgrastim injection What is this medicine? PEGFILGRASTIM (peg fil GRA stim) is a long-acting granulocyte colony-stimulating factor that stimulates the growth of neutrophils, a type of white blood cell important in the body's fight against infection. It is used to reduce the incidence of fever and infection in patients with certain types of cancer who are receiving chemotherapy that affects the bone marrow. This medicine may be used for other purposes; ask your health care provider or pharmacist if you have questions. COMMON BRAND NAME(S): Neulasta What should I tell my health care provider before I take this medicine? They need to know if you have any of these conditions: -latex allergy -ongoing radiation therapy -sickle cell disease -skin reactions to acrylic adhesives (On-Body Injector only) -an unusual or allergic reaction to pegfilgrastim, filgrastim, other medicines, foods, dyes, or preservatives -pregnant or trying to get pregnant -breast-feeding How should I use this medicine? This medicine is for injection under the skin. If you get this medicine at home, you will be taught how to prepare and give the pre-filled syringe or how to use the On-body Injector. Refer to the patient Instructions for Use for detailed instructions. Use exactly as directed. Take your medicine at regular intervals. Do not take your medicine more often than directed. It is important that you put your used needles and syringes in a special sharps container. Do not put them in a trash can. If you do not have a sharps container, call your pharmacist or healthcare provider to get one. Talk to your pediatrician regarding the use of this medicine in children. Special care may be needed. Overdosage: If you think you have taken too much of this medicine contact a poison control center or emergency room at once. NOTE: This medicine is only for you. Do not share this medicine with others. What if I miss a dose? It is  important not to miss your dose. Call your doctor or health care professional if you miss your dose. If you miss a dose due to an On-body Injector failure or leakage, a new dose should be administered as soon as possible using a single prefilled syringe for manual use. What may interact with this medicine? Interactions have not been studied. Give your health care provider a list of all the medicines, herbs, non-prescription drugs, or dietary supplements you use. Also tell them if you smoke, drink alcohol, or use illegal drugs. Some items may interact with your medicine. This list may not describe all possible interactions. Give your health care provider a list of all the medicines, herbs, non-prescription drugs, or dietary supplements you use. Also tell them if you smoke, drink alcohol, or use illegal drugs. Some items may interact with your medicine. What should I watch for while using this medicine? You may need blood work done while you are taking this medicine. If you are going to need a MRI, CT scan, or other procedure, tell your doctor that you are using this medicine (On-Body Injector only). What side effects may I notice from receiving this medicine? Side effects that you should report to your doctor or health care professional as soon as possible: -allergic reactions like skin rash, itching or hives, swelling of the face, lips, or tongue -dizziness -fever -pain, redness, or irritation at site where injected -pinpoint red spots on the skin -shortness of breath or breathing problems -stomach or side pain, or pain at the shoulder -swelling -tiredness -trouble passing urine Side effects that usually do not require medical attention (report to your doctor   or health care professional if they continue or are bothersome): -bone pain -muscle pain This list may not describe all possible side effects. Call your doctor for medical advice about side effects. You may report side effects to FDA at  1-800-FDA-1088. Where should I keep my medicine? Keep out of the reach of children. Store pre-filled syringes in a refrigerator between 2 and 8 degrees C (36 and 46 degrees F). Do not freeze. Keep in carton to protect from light. Throw away this medicine if it is left out of the refrigerator for more than 48 hours. Throw away any unused medicine after the expiration date. NOTE: This sheet is a summary. It may not cover all possible information. If you have questions about this medicine, talk to your doctor, pharmacist, or health care provider.  2015, [employer]vier/Gold Standard. (2014-02-10 16:14:05)  

## 2014-10-05 NOTE — Telephone Encounter (Signed)
Patient here for neulasta after first chemo treatment yesterday.  Patient c/o some nausea, no vomiting.  Patient is able to eat and drink.  States she has not taken any nausea medicine but will take a zofran when she gets home.  Also complains of diarrhea x4 but states she just started a z-pak 2 days ago and believes it is coming from that.  Denies mouth sores, pain or fever.  Will call back if diarrhea persists or fever is >100.5.

## 2014-10-06 ENCOUNTER — Ambulatory Visit (HOSPITAL_COMMUNITY)
Admission: RE | Admit: 2014-10-06 | Discharge: 2014-10-06 | Disposition: A | Discharge status: Home / Self Care | Payer: BC Managed Care – PPO | Source: Ambulatory Visit | Attending: Radiation Oncology | Admitting: Radiation Oncology

## 2014-10-06 ENCOUNTER — Other Ambulatory Visit: Payer: Self-pay | Admitting: Radiation Oncology

## 2014-10-06 ENCOUNTER — Telehealth: Payer: Self-pay

## 2014-10-06 ENCOUNTER — Telehealth: Payer: Self-pay | Admitting: *Deleted

## 2014-10-06 ENCOUNTER — Encounter: Payer: Self-pay | Admitting: Radiation Oncology

## 2014-10-06 DIAGNOSIS — Z853 Personal history of malignant neoplasm of breast: Secondary | ICD-10-CM | POA: Diagnosis present

## 2014-10-06 DIAGNOSIS — Z90721 Acquired absence of ovaries, unilateral: Secondary | ICD-10-CM | POA: Diagnosis present

## 2014-10-06 DIAGNOSIS — N83202 Unspecified ovarian cyst, left side: Secondary | ICD-10-CM

## 2014-10-06 DIAGNOSIS — N8329 Other ovarian cysts: Secondary | ICD-10-CM | POA: Insufficient documentation

## 2014-10-06 NOTE — Telephone Encounter (Signed)
Patient notified of ultra-sound.  Results negative for cancer.Informed of 2 cysts over left ovary.

## 2014-10-06 NOTE — Telephone Encounter (Signed)
Patient called about FMLA papers, sent inbasket to [place].

## 2014-10-10 ENCOUNTER — Encounter: Payer: Self-pay | Admitting: Hematology and Oncology

## 2014-10-10 NOTE — Progress Notes (Signed)
Faxed fmla form to [facility] @ [phone]

## 2014-10-11 ENCOUNTER — Ambulatory Visit (HOSPITAL_BASED_OUTPATIENT_CLINIC_OR_DEPARTMENT_OTHER): Payer: BC Managed Care – PPO | Admitting: Nurse Practitioner

## 2014-10-11 ENCOUNTER — Ambulatory Visit (HOSPITAL_BASED_OUTPATIENT_CLINIC_OR_DEPARTMENT_OTHER): Payer: BC Managed Care – PPO

## 2014-10-11 ENCOUNTER — Encounter: Payer: Self-pay | Admitting: Hematology and Oncology

## 2014-10-11 ENCOUNTER — Other Ambulatory Visit (HOSPITAL_BASED_OUTPATIENT_CLINIC_OR_DEPARTMENT_OTHER): Payer: BC Managed Care – PPO

## 2014-10-11 ENCOUNTER — Telehealth: Payer: Self-pay | Admitting: Hematology and Oncology

## 2014-10-11 ENCOUNTER — Other Ambulatory Visit: Payer: Self-pay | Admitting: Nurse Practitioner

## 2014-10-11 ENCOUNTER — Ambulatory Visit (HOSPITAL_BASED_OUTPATIENT_CLINIC_OR_DEPARTMENT_OTHER): Payer: BC Managed Care – PPO | Admitting: Hematology and Oncology

## 2014-10-11 VITALS — BP 122/78 | HR 100 | Temp 98.8°F | Resp 18 | Ht 61.0 in | Wt 181.5 lb

## 2014-10-11 DIAGNOSIS — C50412 Malignant neoplasm of upper-outer quadrant of left female breast: Secondary | ICD-10-CM

## 2014-10-11 DIAGNOSIS — E860 Dehydration: Secondary | ICD-10-CM

## 2014-10-11 DIAGNOSIS — R197 Diarrhea, unspecified: Secondary | ICD-10-CM

## 2014-10-11 DIAGNOSIS — T829XXA Unspecified complication of cardiac and vascular prosthetic device, implant and graft, initial encounter: Secondary | ICD-10-CM

## 2014-10-11 DIAGNOSIS — R000 Tachycardia, unspecified: Secondary | ICD-10-CM

## 2014-10-11 DIAGNOSIS — K521 Toxic gastroenteritis and colitis: Secondary | ICD-10-CM

## 2014-10-11 LAB — COMPREHENSIVE METABOLIC PANEL (CC13)
ALK PHOS: 99 U/L (ref 40–150)
ALT: 23 U/L (ref 0–55)
AST: 18 U/L (ref 5–34)
Albumin: 3.5 g/dL (ref 3.5–5.0)
Anion Gap: 7 mEq/L (ref 3–11)
BILIRUBIN TOTAL: 0.24 mg/dL (ref 0.20–1.20)
BUN: 7.5 mg/dL (ref 7.0–26.0)
CO2: 21 meq/L — AB (ref 22–29)
Calcium: 9.9 mg/dL (ref 8.4–10.4)
Chloride: 108 mEq/L (ref 98–109)
Creatinine: 0.8 mg/dL (ref 0.6–1.1)
Glucose: 107 mg/dl (ref 70–140)
POTASSIUM: 4.5 meq/L (ref 3.5–5.1)
SODIUM: 135 meq/L — AB (ref 136–145)
TOTAL PROTEIN: 7 g/dL (ref 6.4–8.3)

## 2014-10-11 LAB — CBC WITH DIFFERENTIAL/PLATELET
BASO%: 0.7 % (ref 0.0–2.0)
Basophils Absolute: 0 10*3/uL (ref 0.0–0.1)
EOS ABS: 0 10*3/uL (ref 0.0–0.5)
EOS%: 0.2 % (ref 0.0–7.0)
HCT: 37.2 % (ref 34.8–46.6)
HGB: 12.6 g/dL (ref 11.6–15.9)
LYMPH%: 34.3 % (ref 14.0–49.7)
MCH: 28.7 pg (ref 25.1–34.0)
MCHC: 33.9 g/dL (ref 31.5–36.0)
MCV: 84.7 fL (ref 79.5–101.0)
MONO#: 0.4 10*3/uL (ref 0.1–0.9)
MONO%: 9.2 % (ref 0.0–14.0)
NEUT%: 55.6 % (ref 38.4–76.8)
NEUTROS ABS: 2.2 10*3/uL (ref 1.5–6.5)
Platelets: 324 10*3/uL (ref 145–400)
RBC: 4.39 10*6/uL (ref 3.70–5.45)
RDW: 12.3 % (ref 11.2–14.5)
WBC: 4 10*3/uL (ref 3.9–10.3)
lymph#: 1.4 10*3/uL (ref 0.9–3.3)

## 2014-10-11 MED ORDER — LOPERAMIDE HCL 2 MG PO CAPS: 2.0000 mg | ORAL_CAPSULE | ORAL | Status: DC | PRN

## 2014-10-11 MED ORDER — SODIUM CHLORIDE 0.9 % IJ SOLN
10.0000 mL | INTRAMUSCULAR | Status: DC | PRN
  Administered 2014-10-11: 10 mL via INTRAVENOUS
  Filled 2014-10-11: qty 10

## 2014-10-11 MED ORDER — HEPARIN SOD (PORK) LOCK FLUSH 100 UNIT/ML IV SOLN
500.0000 [IU] | Freq: Once | INTRAVENOUS | Status: AC
  Administered 2014-10-11: 500 [IU] via INTRAVENOUS
  Filled 2014-10-11: qty 5

## 2014-10-11 MED ORDER — LIDOCAINE-PRILOCAINE 2.5-2.5 % EX CREA
TOPICAL_CREAM | CUTANEOUS | Status: AC
  Filled 2014-10-11: qty 5

## 2014-10-11 MED ORDER — SODIUM CHLORIDE 0.9 % IV SOLN
INTRAVENOUS | Status: DC
  Administered 2014-10-11: 14:00:00 via INTRAVENOUS

## 2014-10-11 NOTE — Assessment & Plan Note (Addendum)
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 clinical stage ER 7% PR 0% HER-2 positive ratio 3.84 in the reconstructed left breast.  Chemotherapy toxicity evaluation: patient had profound diarrhea starting Sunday multiple times throughout the day up until now. She did not take any Imodium. She feels that her bottom is very sore from frequent bowel movements. She is also getting very tight. She tried her best to keep up her hydration. Today she is slightly tachycardic. Awaiting the results of her electrolytes and kidney function. OtherwisePatient tolerated chemotherapy fairly well. She is here for a cycle 1 day  toxicity check.   I reviewed her blood work and discussed the results with the patient. Patient is a very difficult stick for blood work.  Diarrhea due to chemotherapy: Instructed her to take Imodium.  Return to clinic in 2 weeks for cycle #2 of TCHP

## 2014-10-11 NOTE — Progress Notes (Signed)
No care team member to display  DIAGNOSIS: Breast cancer of upper-outer quadrant of left female breast   Staging form: Breast, AJCC 7th Edition     Clinical: Stage IIA (T2, N0, cM0) - Signed by Rulon Eisenmenger, MD on 09/20/2014     Pathologic: Stage 0 (Tis, N0, cM0) - Signed by Rolm Bookbinder, MD on 03/13/2012   SUMMARY OF ONCOLOGIC HISTORY: Oncology History   DCIS left breast     Breast cancer of upper-outer quadrant of left female breast   05/10/2009 Initial Diagnosis DCIS left breast Treated with left mastectomy 19.5 cm DCIS ER 0% PR 0%   09/09/2014 Relapse/Recurrence Ultrasound left breast: hypoechoic mass measuring 1.6 x 4.4 x 3.3 cm  MRI breast: 4 cm mass in the left breast close to the pectoralis, no lymphadenopathy   09/12/2014 Initial Biopsy Invasive ductal carcinoma ER 7%, PR 0%, HER-2 amplified ratio 3.84 average in copy #7.3   10/04/2014 -  Chemotherapy Neoadjuvant chemotherapy with Taxotere, carboplatin, Herceptin, Perjeta x6 cycles    CHIEF COMPLIANT: Diarrhea due to chemotherapy. 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 She is here for a one-week followup and toxicity evaluation. She had profound diarrhea starting Sunday. She did not call us with this complaint and has not taken any Imodium. She reports that she is very sore on the bottom from having frequent bowel movements. She is also tired and fatigued. Has been drinking a lot of water to keep up with hydration. She noted that her case in the mouth is changed. doughnuts do not taste the same as before.  REVIEW OF SYSTEMS:   Constitutional: Denies fevers, chills or abnormal weight loss Eyes: Denies blurriness of vision Ears, nose, mouth, throat, and face: Denies mucositis or sore  throat Respiratory: Denies cough, dyspnea or wheezes Cardiovascular: Denies palpitation, chest discomfort or lower extremity swelling Gastrointestinal: diarrhea Skin: Denies abnormal skin rashes Lymphatics: Denies new lymphadenopathy or easy bruising Neurological:Denies numbness, tingling or new weaknesses Behavioral/Psych: Mood is stable, no new changes  All other systems were reviewed with the patient and are negative.  I have reviewed the past medical history, past surgical history, social history and family history with the patient and they are unchanged from previous note.  ALLERGIES:  is allergic to corn-containing products.  MEDICATIONS:  Current Outpatient Prescriptions  Medication Sig Dispense Refill  . acetaminophen (TYLENOL) 325 MG tablet Take 2 tablets (650 mg total) by mouth every 6 (six) hours as needed. 60 tablet 1  . azithromycin (ZITHROMAX Z-PAK) 250 MG tablet Take by mouth, two tables (500 mg) on the first day and one tablet (250) daily until completed. 6 each 0  . dexamethasone (DECADRON) 4 MG tablet Take 2 tablets (8 mg total) by mouth 2 (two) times daily. Start the day before Taxotere. Then again the day after chemo for 3 days. 30 tablet 1  . ibuprofen (ADVIL,MOTRIN) 100 MG chewable tablet Chew by mouth every 8 (eight) hours as needed.    . lidocaine-prilocaine (EMLA) cream Apply to affected area once 30 g 3  . lidocaine-prilocaine (EMLA) cream Apply to affected area once 30 g 3  . loratadine (CLARITIN) 10 MG tablet Take 1 tablet (10 mg total) by mouth daily. 30 tablet 1  . LORazepam (ATIVAN) 0.5 MG tablet Take  1 tablet (0.5 mg total) by mouth every 6 (six) hours as needed (Nausea or vomiting). 30 tablet 0  . ondansetron (ZOFRAN) 8 MG tablet Take 1 tablet (8 mg total) by mouth 2 (two) times daily. Start the day after chemo for 3 days. Then take as needed for nausea or vomiting. 30 tablet 1  . prochlorperazine (COMPAZINE) 10 MG tablet Take 1 tablet (10 mg total) by mouth  every 6 (six) hours as needed (Nausea or vomiting). 30 tablet 1  . 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 loperamide (IMODIUM) 2 MG capsule Take 1 capsule (2 mg total) by mouth as needed for diarrhea or loose stools. 30 capsule 3   No current facility-administered medications for this visit.    PHYSICAL EXAMINATION: ECOG PERFORMANCE STATUS: 1 - Symptomatic but completely ambulatory  Filed Vitals:   10/11/14 0953  BP: 122/78  Pulse: 100  Temp: 98.8 F (37.1 C)  Resp: 18   Filed Weights   10/11/14 0953  Weight: 181 lb 8 oz (82.328 kg)    GENERAL:alert, no distress and comfortable SKIN: skin color, texture, turgor are normal, no rashes or significant lesions EYES: normal, Conjunctiva are pink and non-injected, sclera clear OROPHARYNX:no exudate, no erythema and lips, buccal mucosa, and tongue normal  NECK: supple, thyroid normal size, non-tender, without nodularity LYMPH:  no palpable lymphadenopathy in the cervical, axillary or inguinal LUNGS: clear to auscultation and percussion with normal breathing effort HEART: regular rate & rhythm and no murmurs and no lower extremity edema ABDOMEN:abdomen soft, non-tender and normal bowel sounds Musculoskeletal:no cyanosis of digits and no clubbing  NEURO: alert & oriented x 3 with fluent speech, no focal motor/sensory deficits  LABORATORY DATA:  I have reviewed the data as listed   Chemistry      Component Value Date/Time   NA 138 10/04/2014 0905   NA 136* 09/23/2014 1140   K 4.1 10/04/2014 0905   K 4.4 09/23/2014 1140   CL 102 09/23/2014 1140   CO2 20* 10/04/2014 0905   CO2 22 09/23/2014 1140   BUN 10.1 10/04/2014 0905   BUN 8 09/23/2014 1140   CREATININE 0.9 10/04/2014 0905   CREATININE 0.63 09/23/2014 1140      Component Value Date/Time   CALCIUM 11.4* 10/04/2014 0905   CALCIUM 10.3 09/23/2014 1140   ALKPHOS 126 10/04/2014 0905   ALKPHOS 91  11/01/2009 1212   AST 37* 10/04/2014 0905   AST 22 11/01/2009 1212   ALT 55 10/04/2014 0905   ALT 22 11/01/2009 1212   BILITOT 0.38 10/04/2014 0905   BILITOT 0.6 11/01/2009 1212       Lab Results  Component Value Date   WBC 11.5* 10/04/2014   HGB 12.7 10/04/2014   HCT 36.9 10/04/2014   MCV 84.1 10/04/2014   PLT 379 10/04/2014   NEUTROABS 10.6* 10/04/2014    ASSESSMENT & PLAN:  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 clinical stage ER 7% PR 0% HER-2 positive ratio 3.84 in the reconstructed left breast.  Chemotherapy toxicity evaluation: patient had profound diarrhea starting Sunday multiple times throughout the day up until now. She did not take any Imodium. She feels that her bottom is very sore from frequent bowel movements. She is also getting very tight. She tried her best to keep up her hydration. Today she  is slightly tachycardic. Awaiting the results of her electrolytes and kidney function. OtherwisePatient tolerated chemotherapy fairly well. She is here for a cycle 1 day  toxicity check.   I reviewed her blood work and discussed the results with the patient. CBC is within normal limits but are waiting for the CMP results to determine if she needs IV hydration. Patient is a very difficult stick for blood work.  Diarrhea due to chemotherapy: Instructed her to take Imodium. We will give her half a liter of normal saline with 10 of potassium chloride to hydrate her today  Return to clinic in 2 weeks for cycle #2 of TCHP   Orders Placed This Encounter  Procedures  . CBC with Differential    Standing Status: Future     Number of Occurrences: 1     Standing Expiration Date: 10/11/2015  . Comprehensive metabolic panel (Cmet) - [facility]    Standing Status: Future     Number of Occurrences: 1     Standing Expiration Date: 10/11/2015   The patient has a good understanding of the overall  plan. she agrees with it. She will call with any problems that may develop before her next visit here.   Rulon Eisenmenger, MD 10/11/2014 10:43 AM

## 2014-10-11 NOTE — Progress Notes (Signed)
11/10 - Discontinued patient port - liquid was coming from the insertion site.  This liquid was thin with some air bubbles, and the color of "[place] dressing".  Pressure applied and guaze/tape dressing put on.  Pt reports no pain at site, afebrile, no redness heat or edema at site.   11/11 - MD notified of discharge from patient's port site.

## 2014-10-11 NOTE — Telephone Encounter (Signed)
per pof to sch pt appt-gave pt copy of sch °

## 2014-10-11 NOTE — Patient Instructions (Signed)
Dehydration, Adult Dehydration is when you lose more fluids from the body than you take in. Vital organs like the kidneys, brain, and heart cannot function without a proper amount of fluids and salt. Any loss of fluids from the body can cause dehydration.  CAUSES   Vomiting.  Diarrhea.  Excessive sweating.  Excessive urine output.  Fever. SYMPTOMS  Mild dehydration  Thirst.  Dry lips.  Slightly dry mouth. Moderate dehydration  Very dry mouth.  Sunken eyes.  Skin does not bounce back quickly when lightly pinched and released.  Dark urine and decreased urine production.  Decreased tear production.  Headache. Severe dehydration  Very dry mouth.  Extreme thirst.  Rapid, weak pulse (more than 100 beats per minute at rest).  Cold hands and feet.  Not able to sweat in spite of heat and temperature.  Rapid breathing.  Blue lips.  Confusion and lethargy.  Difficulty being awakened.  Minimal urine production.  No tears. DIAGNOSIS  Your caregiver will diagnose dehydration based on your symptoms and your exam. Blood and urine tests will help confirm the diagnosis. The diagnostic evaluation should also identify the cause of dehydration. TREATMENT  Treatment of mild or moderate dehydration can often be done at home by increasing the amount of fluids that you drink. It is best to drink small amounts of fluid more often. Drinking too much at one time can make vomiting worse. Refer to the home care instructions below. Severe dehydration needs to be treated at the hospital where you will probably be given intravenous (IV) fluids that contain water and electrolytes. HOME CARE INSTRUCTIONS   Ask your caregiver about specific rehydration instructions.  Drink enough fluids to keep your urine clear or pale yellow.  Drink small amounts frequently if you have nausea and vomiting.  Eat as you normally do.  Avoid:  Foods or drinks high in sugar.  Carbonated  drinks.  Juice.  Extremely hot or cold fluids.  Drinks with caffeine.  Fatty, greasy foods.  Alcohol.  Tobacco.  Overeating.  Gelatin desserts.  Wash your hands well to avoid spreading bacteria and viruses.  Only take over-the-counter or prescription medicines for pain, discomfort, or fever as directed by your caregiver.  Ask your caregiver if you should continue all prescribed and over-the-counter medicines.  Keep all follow-up appointments with your caregiver. SEEK MEDICAL CARE IF:  You have abdominal pain and it increases or stays in one area (localizes).  You have a rash, stiff neck, or severe headache.  You are irritable, sleepy, or difficult to awaken.  You are weak, dizzy, or extremely thirsty. SEEK IMMEDIATE MEDICAL CARE IF:   You are unable to keep fluids down or you get worse despite treatment.  You have frequent episodes of vomiting or diarrhea.  You have blood or green matter (bile) in your vomit.  You have blood in your stool or your stool looks black and tarry.  You have not urinated in 6 to 8 hours, or you have only urinated a small amount of very dark urine.  You have a fever.  You faint. MAKE SURE YOU:   Understand these instructions.  Will watch your condition.  Will get help right away if you are not doing well or get worse. Document Released: 11/11/2005 Document Revised: 02/03/2012 Document Reviewed: 07/01/2011 Exit[employer] Patient Information 2015 [employer]. This information is not intended to replace advice given to you by your health care provider. Make sure you discuss any questions you have with your health care   provider.  

## 2014-10-11 NOTE — Addendum Note (Signed)
Addended by: Prentiss Bells on: 10/11/2014 01:54 PM   Modules accepted: Orders

## 2014-10-11 NOTE — Progress Notes (Signed)
Deaccessed POC and noted yellow/gold drainage from where needle was placed. Notified Retta Mac, NP for assessment. Wound culture taken per NP.

## 2014-10-12 ENCOUNTER — Encounter: Payer: Self-pay | Admitting: Genetic Counselor

## 2014-10-12 NOTE — Progress Notes (Signed)
GENETIC TEST RESULTS  Patient Name: Aimee Poole Patient Age: 43 y.o. Encounter Date: 10/12/2014  Referring Physician: Rolm Bookbinder, MD   Ms. Brunelle was called today to discuss genetic test results. Please see the Genetics note from her visit on 09/22/14 for a detailed discussion of her personal and family history.  GENETIC TESTING: At the time of Ms. Thedford's visit, we recommended she pursue genetic testing of multiple genes on the BreastNext gene panel. This test, which included sequencing and deletion/duplication analysis of 17 genes, was performed at [facility]. Testing was normal and did not reveal a mutation in these genes. The genes tested were ATM, BARD1, BRCA1, BRCA2, BRIP1, CDH1, CHEK2, MRE11A, MUTYH, NBN, NF1, PALB2, PTEN, RAD50, RAD51C, RAD51D, and TP53.  We discussed with Ms. Cedillo that since the current test is not perfect, it is possible there may be a gene mutation that current testing cannot detect, but that chance is small. We also discussed that it is possible that a different genetic factor, which was not part of this testing or has not yet been discovered, is responsible for the cancer diagnoses in the family. Given her family history, this is also of low likelihood. Should Ms. Petrovic wish to discuss or pursue this additional testing, we are happy to coordinate this at any time, but do not feel that she is at significant risk of harboring a mutation in a different gene.     CANCER SCREENING: This result suggests that Ms. Benzel's cancer was most likely not due to an inherited predisposition. Most cancers happen by chance and this negative test, along with details of her family history, suggests that her cancer falls into this category. We, therefore, recommended she continue to follow the cancer screening guidelines provided by her physician.   FAMILY MEMBERS:  Women in the family are at some increased risk of developing breast cancer, over the general population  risk, simply due to the family history. We recommended they have a yearly mammogram beginning 10 years younger than the youngest breast cancer in the family, a yearly clinical breast exam, and perform monthly breast self-exams. A gynecologic exam is recommended yearly. Colon cancer screening is recommended to begin by age 56.  Lastly, we discussed with Ms. Barrasso that cancer genetics is a rapidly advancing field and it is possible that new genetic tests will be appropriate for her in the future. We encouraged her to remain in contact with [place] on an annual basis so we can update her personal and family histories, and let her know of advances in cancer genetics that may benefit the family. Our contact number was provided. Ms. Vida questions were answered to her satisfaction today, and she knows she is welcome to call anytime with additional questions.    Steele Berg, MS, [facility] Certified Genetic Counselor phone: [phone] [email or web address]

## 2014-10-13 ENCOUNTER — Encounter: Payer: Self-pay | Admitting: Nurse Practitioner

## 2014-10-13 DIAGNOSIS — E860 Dehydration: Secondary | ICD-10-CM | POA: Insufficient documentation

## 2014-10-13 DIAGNOSIS — R197 Diarrhea, unspecified: Secondary | ICD-10-CM | POA: Insufficient documentation

## 2014-10-13 DIAGNOSIS — T829XXA Unspecified complication of cardiac and vascular prosthetic device, implant and graft, initial encounter: Secondary | ICD-10-CM | POA: Insufficient documentation

## 2014-10-13 NOTE — Assessment & Plan Note (Signed)
Patient has been experiencing significant diarrhea since receiving her for cycle of chemotherapy.  She will receive one maternal saline IV fluid rehydration while cancer Center today.

## 2014-10-13 NOTE — Progress Notes (Signed)
will   SYMPTOM MANAGEMENT CLINIC   HPI: Aimee Poole 43 y.o. female diagnosed with breast cancer.  Currently undergoing Taxotere/carboplatin/Herceptin/Perjeta chemotherapy regimen.  Patient received cycle one of her chemotherapy on 10/04/2014.  Since that time patient has been experiencing some significant diarrhea and subsequent dehydration.  Patient presented to the [facility] today for IV fluid rehydration only.  She has been taking Imodium for her diarrhea as directed.  Patient had completed all of her IV fluid rehydration; and was noted to have some thin, purulent drainage from her Port-A-Cath stick site when the cath was deaccessed.   There is no erythema, edema, warmth, or tenderness to site.  Patient is also afebrile on exam.  HPI  CURRENT THERAPY: Upcoming Treatment Dates - BREAST TCH q21d (order Herceptin separately) Days with orders from any treatment category:  10/25/2014      SCHEDULING COMMUNICATION      ondansetron (ZOFRAN) IVPB 16 mg      Dexamethasone Sodium Phosphate (DECADRON) injection 20 mg      DOCEtaxel (TAXOTERE) 140 mg in dextrose 5 % 250 mL chemo infusion      CARBOplatin (PARAPLATIN) 750 mg in sodium chloride 0.9 % 250 mL chemo infusion      sodium chloride 0.9 % injection 10 mL      heparin lock flush 100 unit/mL      heparin lock flush 100 unit/mL      alteplase (CATHFLO ACTIVASE) injection 2 mg      sodium chloride 0.9 % injection 3 mL      Cold Pack 1 packet      diphenhydrAMINE (BENADRYL) injection 25 mg      famotidine (PEPCID) IVPB 20 mg      0.9 %  sodium chloride infusion      methylPREDNISolone sodium succinate (SOLU-MEDROL) 125 mg/2 mL injection 125 mg      EPINEPHrine (ADRENALIN) 0.1 MG/ML injection 0.25 mg      EPINEPHrine (ADRENALIN) 0.1 MG/ML injection 0.25 mg      EPINEPHrine (ADRENALIN) injection 0.5 mg      EPINEPHrine (ADRENALIN) injection 0.5 mg      diphenhydrAMINE (BENADRYL) injection 50 mg      albuterol (PROVENTIL) (2.5  MG/3ML) 0.083% nebulizer solution 2.5 mg      0.9 %  sodium chloride infusion      TREATMENT CONDITIONS 10/26/2014      SCHEDULING COMMUNICATION INJECTION      pegfilgrastim (NEULASTA) injection 6 mg 11/15/2014      SCHEDULING COMMUNICATION      ondansetron (ZOFRAN) IVPB 16 mg      Dexamethasone Sodium Phosphate (DECADRON) injection 20 mg      DOCEtaxel (TAXOTERE) 140 mg in dextrose 5 % 250 mL chemo infusion      CARBOplatin (PARAPLATIN) 750 mg in sodium chloride 0.9 % 250 mL chemo infusion      sodium chloride 0.9 % injection 10 mL      heparin lock flush 100 unit/mL      heparin lock flush 100 unit/mL      alteplase (CATHFLO ACTIVASE) injection 2 mg      sodium chloride 0.9 % injection 3 mL      Cold Pack 1 packet      diphenhydrAMINE (BENADRYL) injection 25 mg      famotidine (PEPCID) IVPB 20 mg      0.9 %  sodium chloride infusion      methylPREDNISolone sodium succinate (SOLU-MEDROL) 125 mg/2 mL injection 125 mg  EPINEPHrine (ADRENALIN) 0.1 MG/ML injection 0.25 mg      EPINEPHrine (ADRENALIN) 0.1 MG/ML injection 0.25 mg      EPINEPHrine (ADRENALIN) injection 0.5 mg      EPINEPHrine (ADRENALIN) injection 0.5 mg      diphenhydrAMINE (BENADRYL) injection 50 mg      albuterol (PROVENTIL) (2.5 MG/3ML) 0.083% nebulizer solution 2.5 mg      0.9 %  sodium chloride infusion      TREATMENT CONDITIONS    ROS  Past Medical History  Diagnosis Date  . Cancer     left breast dcis  . Family history of breast cancer   . Breast cancer     left breast    Past Surgical History  Procedure Laterality Date  . Mastectomy Left 2010  . Breast reconstruction Left 2011  . Breast reduction surgery Right 2011  . Ectopic pregnancy surgery    . Cystectomy      ovary as well as scar tissue  . Ultrasound of left breast  09/09/2014    Hypoechoic mass  . Initial biopsy  09/12/2014    Invasive ductal ca  . Fibroid tumor removed      unknown date  . Cesarean section  2008    x 1    has  History of breast cancer in female; Breast cancer of upper-outer quadrant of left female breast; Family history of breast cancer; Dehydration; Central line complication; and Diarrhea on her problem list.     is allergic to corn-containing products.    Medication List       This list is accurate as of: 10/11/14 11:59 PM.  Always use your most recent med list.               acetaminophen 325 MG tablet  Commonly known as:  TYLENOL  Take 2 tablets (650 mg total) by mouth every 6 (six) hours as needed.     azithromycin 250 MG tablet  Commonly known as:  ZITHROMAX Z-PAK  Take by mouth, two tables (500 mg) on the first day and one tablet (250) daily until completed.     dexamethasone 4 MG tablet  Commonly known as:  DECADRON  Take 2 tablets (8 mg total) by mouth 2 (two) times daily. Start the day before Taxotere. Then again the day after chemo for 3 days.     ibuprofen 100 MG chewable tablet  Commonly known as:  ADVIL,MOTRIN  Chew by mouth every 8 (eight) hours as needed.     lidocaine-prilocaine cream  Commonly known as:  EMLA  Apply to affected area once     lidocaine-prilocaine cream  Commonly known as:  EMLA  Apply to affected area once     loperamide 2 MG capsule  Commonly known as:  IMODIUM  Take 1 capsule (2 mg total) by mouth as needed for diarrhea or loose stools.     loratadine 10 MG tablet  Commonly known as:  CLARITIN  Take 1 tablet (10 mg total) by mouth daily.     LORazepam 0.5 MG tablet  Commonly known as:  ATIVAN  Take 1 tablet (0.5 mg total) by mouth every 6 (six) hours as needed (Nausea or vomiting).     ondansetron 8 MG tablet  Commonly known as:  ZOFRAN  Take 1 tablet (8 mg total) by mouth 2 (two) times daily. Start the day after chemo for 3 days. Then take as needed for nausea or vomiting.     prochlorperazine 10 MG  tablet  Commonly known as:  COMPAZINE  Take 1 tablet (10 mg total) by mouth every 6 (six) hours as needed (Nausea or vomiting).      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  Vitals: BP 122/78, HR 100 temp 98.8  Physical Exam  Constitutional: She is oriented to person, place, and time and well-developed, well-nourished, and in no distress.  HENT:  Head: Normocephalic and atraumatic.  Eyes: Conjunctivae and EOM are normal. Pupils are equal, round, and reactive to light. No scleral icterus.  Neck: Normal range of motion. No JVD present.  Pulmonary/Chest: Effort normal. No respiratory distress.  Musculoskeletal: Normal range of motion. She exhibits no edema.  Neurological: She is alert and oriented to person, place, and time. Gait normal.  Skin: Skin is warm and dry. No rash noted. No erythema.  Right upper chest Port-A-Cath site with trace again, purulent drainage seeping from Port-A-Cath needle insertion site.  There is no erythema, edema, warmth, or red streaks.  There is also tenderness to site.  Psychiatric: Affect normal.  Nursing note and vitals reviewed.   LABORATORY DATA:. Appointment on 10/11/2014  Component Date Value Ref Range Status  . WBC 10/11/2014 4.0  3.9 - 10.3 10e3/uL Final  . NEUT# 10/11/2014 2.2  1.5 - 6.5 10e3/uL Final  . HGB 10/11/2014 12.6  11.6 - 15.9 g/dL Final  . HCT 10/11/2014 37.2  34.8 - 46.6 % Final  . Platelets 10/11/2014 324  145 - 400 10e3/uL Final  . MCV 10/11/2014 84.7  79.5 - 101.0 fL Final  . MCH 10/11/2014 28.7  25.1 - 34.0 pg Final  . MCHC 10/11/2014 33.9  31.5 - 36.0 g/dL Final  . RBC 10/11/2014 4.39  3.70 - 5.45 10e6/uL Final  . RDW 10/11/2014 12.3  11.2 - 14.5 % Final  . lymph# 10/11/2014 1.4  0.9 - 3.3 10e3/uL Final  . MONO# 10/11/2014 0.4  0.1 - 0.9 10e3/uL Final  . Eosinophils Absolute 10/11/2014 0.0  0.0 - 0.5 10e3/uL Final  . Basophils Absolute 10/11/2014 0.0  0.0 - 0.1 10e3/uL Final  . NEUT% 10/11/2014 55.6  38.4 - 76.8 % Final    . LYMPH% 10/11/2014 34.3  14.0 - 49.7 % Final  . MONO% 10/11/2014 9.2  0.0 - 14.0 % Final  . EOS% 10/11/2014 0.2  0.0 - 7.0 % Final  . BASO% 10/11/2014 0.7  0.0 - 2.0 % Final  . Sodium 10/11/2014 135* 136 - 145 mEq/L Final  . Potassium 10/11/2014 4.5  3.5 - 5.1 mEq/L Final  . Chloride 10/11/2014 108  98 - 109 mEq/L Final  . CO2 10/11/2014 21* 22 - 29 mEq/L Final  . Glucose 10/11/2014 107  70 - 140 mg/dl Final  . BUN 10/11/2014 7.5  7.0 - 26.0 mg/dL Final  . Creatinine 10/11/2014 0.8  0.6 - 1.1 mg/dL Final  . Total Bilirubin 10/11/2014 0.24  0.20 - 1.20 mg/dL Final  . Alkaline Phosphatase 10/11/2014 99  40 - 150 U/L Final  . AST 10/11/2014 18  5 - 34 U/L Final  . ALT 10/11/2014 23  0 - 55 U/L Final  . Total Protein 10/11/2014 7.0  6.4 - 8.3 g/dL Final  . Albumin 10/11/2014 3.5  3.5 - 5.0 g/dL Final  . Calcium 10/11/2014 9.9  8.4 - 10.4 mg/dL Final  . Anion Gap 10/11/2014 7  3 - 11 mEq/L Final     RADIOGRAPHIC STUDIES: No results found.  ASSESSMENT/PLAN:    Breast cancer of upper-outer quadrant of left female breast Patient received cycle one of her Taxotere/carboplatinum/Herceptin/Perjeta chemotherapy regimen on 10/04/2014.  She is scheduled for cycle 2 of the same regimen on 10/25/2014.  Central line complication Patient's right upper chest Port-A-Cath site noted to have trace amount of thin, purulent fluid draining once Port-A-Cath needle withdrawn.  There is no erythema, edema, or warmth at Port-A-Cath site.  There is also no tenderness at Port-A-Cath site.  Culture was obtained; and currently awaiting culture results.  Since patient is afebrile and has no other symptoms; will await culture results prior to making the decision regarding antibiotics.  Dehydration Patient has been experiencing significant diarrhea since receiving her for cycle of chemotherapy.  She will receive one maternal saline IV fluid rehydration while cancer Center today.  Diarrhea Patient has been  experiencing significant diarrhea since her first cycle of chemotherapy.  Patient was instructed to try Imodium over-the-counter as directed.   Patient stated understanding of all instructions; and was in agreement with this plan of care. The patient knows to call the clinic with any problems, questions or concerns.   Review/collaboration with Dr. Lindi Adie regarding all aspects of patient's visit today.   Total time spent with patient was 15 minutes;  with greater than 75 percent of that time spent in face to face counseling regarding her symptoms, and coordination of care and follow up.  Disclaimer: This note was dictated with voice recognition software. Similar sounding words can inadvertently be transcribed and may not be corrected upon review.   Drue Second, NP 10/13/2014

## 2014-10-13 NOTE — Assessment & Plan Note (Signed)
Patient has been experiencing significant diarrhea since her first cycle of chemotherapy.  Patient was instructed to try Imodium over-the-counter as directed.

## 2014-10-13 NOTE — Assessment & Plan Note (Signed)
Patient received cycle one of her Taxotere/carboplatinum/Herceptin/Perjeta chemotherapy regimen on 10/04/2014.  She is scheduled for cycle 2 of the same regimen on 10/25/2014.

## 2014-10-13 NOTE — Assessment & Plan Note (Signed)
Patient's right upper chest Port-A-Cath site noted to have trace amount of thin, purulent fluid draining once Port-A-Cath needle withdrawn.  There is no erythema, edema, or warmth at Port-A-Cath site.  There is also no tenderness at Port-A-Cath site.  Culture was obtained; and currently awaiting culture results.  Since patient is afebrile and has no other symptoms; will await culture results prior to making the decision regarding antibiotics.

## 2014-10-14 LAB — WOUND CULTURE

## 2014-10-17 ENCOUNTER — Telehealth: Payer: Self-pay | Admitting: Hematology and Oncology

## 2014-10-17 NOTE — Telephone Encounter (Signed)
email sent to MW to sch pt trmt

## 2014-10-18 ENCOUNTER — Telehealth: Payer: Self-pay | Admitting: Hematology and Oncology

## 2014-10-18 ENCOUNTER — Telehealth: Payer: Self-pay | Admitting: *Deleted

## 2014-10-18 NOTE — Telephone Encounter (Signed)
Per staff message and POF I have scheduled appts. Advised scheduler of appts. JMW  

## 2014-10-18 NOTE — Telephone Encounter (Signed)
sent MW email to sch trmt-no avai @ time to coordinate w/MD appt-VG gave new time-inf sch-cld & left pt message and gave new appt time & date

## 2014-10-24 ENCOUNTER — Other Ambulatory Visit: Payer: Self-pay

## 2014-10-24 DIAGNOSIS — C50412 Malignant neoplasm of upper-outer quadrant of left female breast: Secondary | ICD-10-CM

## 2014-10-25 ENCOUNTER — Other Ambulatory Visit: Payer: Self-pay | Admitting: Hematology and Oncology

## 2014-10-25 ENCOUNTER — Ambulatory Visit (HOSPITAL_BASED_OUTPATIENT_CLINIC_OR_DEPARTMENT_OTHER): Payer: BC Managed Care – PPO

## 2014-10-25 ENCOUNTER — Telehealth: Payer: Self-pay | Admitting: Hematology and Oncology

## 2014-10-25 ENCOUNTER — Other Ambulatory Visit: Payer: BC Managed Care – PPO

## 2014-10-25 ENCOUNTER — Ambulatory Visit: Payer: BC Managed Care – PPO | Admitting: Hematology and Oncology

## 2014-10-25 ENCOUNTER — Ambulatory Visit: Payer: BC Managed Care – PPO

## 2014-10-25 ENCOUNTER — Ambulatory Visit (HOSPITAL_BASED_OUTPATIENT_CLINIC_OR_DEPARTMENT_OTHER): Payer: BC Managed Care – PPO | Admitting: Hematology and Oncology

## 2014-10-25 ENCOUNTER — Other Ambulatory Visit (HOSPITAL_BASED_OUTPATIENT_CLINIC_OR_DEPARTMENT_OTHER): Payer: BC Managed Care – PPO

## 2014-10-25 DIAGNOSIS — C50412 Malignant neoplasm of upper-outer quadrant of left female breast: Secondary | ICD-10-CM

## 2014-10-25 DIAGNOSIS — Z5111 Encounter for antineoplastic chemotherapy: Secondary | ICD-10-CM

## 2014-10-25 DIAGNOSIS — R197 Diarrhea, unspecified: Secondary | ICD-10-CM

## 2014-10-25 DIAGNOSIS — Z5112 Encounter for antineoplastic immunotherapy: Secondary | ICD-10-CM

## 2014-10-25 LAB — CBC WITH DIFFERENTIAL/PLATELET
BASO%: 0 % (ref 0.0–2.0)
BASOS ABS: 0 10*3/uL (ref 0.0–0.1)
EOS%: 0 % (ref 0.0–7.0)
Eosinophils Absolute: 0 10*3/uL (ref 0.0–0.5)
HEMATOCRIT: 34.2 % — AB (ref 34.8–46.6)
HEMOGLOBIN: 11.8 g/dL (ref 11.6–15.9)
LYMPH%: 4.9 % — AB (ref 14.0–49.7)
MCH: 29.1 pg (ref 25.1–34.0)
MCHC: 34.5 g/dL (ref 31.5–36.0)
MCV: 84.2 fL (ref 79.5–101.0)
MONO#: 0.3 10*3/uL (ref 0.1–0.9)
MONO%: 1.6 % (ref 0.0–14.0)
NEUT#: 19.3 10*3/uL — ABNORMAL HIGH (ref 1.5–6.5)
NEUT%: 93.5 % — AB (ref 38.4–76.8)
Platelets: 244 10*3/uL (ref 145–400)
RBC: 4.06 10*6/uL (ref 3.70–5.45)
RDW: 12.8 % (ref 11.2–14.5)
WBC: 20.6 10*3/uL — ABNORMAL HIGH (ref 3.9–10.3)
lymph#: 1 10*3/uL (ref 0.9–3.3)

## 2014-10-25 LAB — COMPREHENSIVE METABOLIC PANEL (CC13)
ALK PHOS: 116 U/L (ref 40–150)
ALT: 33 U/L (ref 0–55)
AST: 25 U/L (ref 5–34)
Albumin: 3.8 g/dL (ref 3.5–5.0)
Anion Gap: 9 mEq/L (ref 3–11)
BILIRUBIN TOTAL: 0.23 mg/dL (ref 0.20–1.20)
BUN: 10.4 mg/dL (ref 7.0–26.0)
CALCIUM: 11.1 mg/dL — AB (ref 8.4–10.4)
CHLORIDE: 108 meq/L (ref 98–109)
CO2: 23 mEq/L (ref 22–29)
CREATININE: 0.7 mg/dL (ref 0.6–1.1)
Glucose: 129 mg/dl (ref 70–140)
Potassium: 4.2 mEq/L (ref 3.5–5.1)
Sodium: 139 mEq/L (ref 136–145)
Total Protein: 7.6 g/dL (ref 6.4–8.3)

## 2014-10-25 MED ORDER — SODIUM CHLORIDE 0.9 % IJ SOLN
10.0000 mL | INTRAMUSCULAR | Status: DC | PRN
  Administered 2014-10-25: 10 mL
  Filled 2014-10-25: qty 10

## 2014-10-25 MED ORDER — ACETAMINOPHEN 325 MG PO TABS
650.0000 mg | ORAL_TABLET | Freq: Once | ORAL | Status: AC
  Administered 2014-10-25: 650 mg via ORAL

## 2014-10-25 MED ORDER — SODIUM CHLORIDE 0.9 % IV SOLN
Freq: Once | INTRAVENOUS | Status: AC
  Administered 2014-10-25: 14:00:00 via INTRAVENOUS

## 2014-10-25 MED ORDER — ACETAMINOPHEN 325 MG PO TABS
ORAL_TABLET | ORAL | Status: AC
  Filled 2014-10-25: qty 2

## 2014-10-25 MED ORDER — DIPHENHYDRAMINE HCL 25 MG PO CAPS
ORAL_CAPSULE | ORAL | Status: AC
  Filled 2014-10-25: qty 1

## 2014-10-25 MED ORDER — ONDANSETRON 16 MG/50ML IVPB (CHCC)
INTRAVENOUS | Status: AC
  Filled 2014-10-25: qty 16

## 2014-10-25 MED ORDER — SODIUM CHLORIDE 0.9 % IV SOLN
750.0000 mg | Freq: Once | INTRAVENOUS | Status: AC
  Administered 2014-10-25: 750 mg via INTRAVENOUS
  Filled 2014-10-25: qty 75

## 2014-10-25 MED ORDER — HEPARIN SOD (PORK) LOCK FLUSH 100 UNIT/ML IV SOLN
500.0000 [IU] | Freq: Once | INTRAVENOUS | Status: AC | PRN
  Administered 2014-10-25: 500 [IU]
  Filled 2014-10-25: qty 5

## 2014-10-25 MED ORDER — DIPHENOXYLATE-ATROPINE 2.5-0.025 MG PO TABS: 1.0000 | ORAL_TABLET | Freq: Four times a day (QID) | ORAL | Status: DC | PRN

## 2014-10-25 MED ORDER — DEXAMETHASONE SODIUM PHOSPHATE 20 MG/5ML IJ SOLN
INTRAMUSCULAR | Status: AC
  Filled 2014-10-25: qty 5

## 2014-10-25 MED ORDER — DIPHENHYDRAMINE HCL 25 MG PO CAPS
50.0000 mg | ORAL_CAPSULE | Freq: Once | ORAL | Status: AC
  Administered 2014-10-25: 50 mg via ORAL

## 2014-10-25 MED ORDER — SODIUM CHLORIDE 0.9 % IV SOLN
420.0000 mg | Freq: Once | INTRAVENOUS | Status: AC
  Administered 2014-10-25: 420 mg via INTRAVENOUS
  Filled 2014-10-25: qty 14

## 2014-10-25 MED ORDER — ONDANSETRON 16 MG/50ML IVPB (CHCC)
16.0000 mg | Freq: Once | INTRAVENOUS | Status: AC
  Administered 2014-10-25: 16 mg via INTRAVENOUS

## 2014-10-25 MED ORDER — TRASTUZUMAB CHEMO INJECTION 440 MG
6.0000 mg/kg | Freq: Once | INTRAVENOUS | Status: AC
  Administered 2014-10-25: 504 mg via INTRAVENOUS
  Filled 2014-10-25: qty 24

## 2014-10-25 MED ORDER — DOCETAXEL CHEMO INJECTION 160 MG/16ML
75.0000 mg/m2 | Freq: Once | INTRAVENOUS | Status: AC
  Administered 2014-10-25: 140 mg via INTRAVENOUS
  Filled 2014-10-25: qty 14

## 2014-10-25 MED ORDER — DEXAMETHASONE SODIUM PHOSPHATE 20 MG/5ML IJ SOLN
20.0000 mg | Freq: Once | INTRAMUSCULAR | Status: AC
  Administered 2014-10-25: 20 mg via INTRAVENOUS

## 2014-10-25 NOTE — Assessment & Plan Note (Signed)
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 clinical stage ER 7% PR 0% HER-2 positive ratio 3.84 in the reconstructed left breast. Today is cycle 2 day 1 of TC HP  Diarrhea due to chemotherapy: I prescribed her Lomotil in addition to Imodium to help with his diarrhea. Return to clinic in one week for toxicity check and followup.  Abnormal discharge from the port: The wound culture was negative for infection. She is afebrile and the port feels normal. Hence we will continue to use support and monitor closely.  Monitoring closely for toxicities

## 2014-10-25 NOTE — Progress Notes (Signed)
No care team member to display  DIAGNOSIS: Breast cancer of upper-outer quadrant of left female breast   Staging form: Breast, AJCC 7th Edition     Clinical: Stage IIA (T2, N0, cM0) - Signed by Rulon Eisenmenger, MD on 09/20/2014     Pathologic: Stage 0 (Tis, N0, cM0) - Signed by Rolm Bookbinder, MD on 03/13/2012   SUMMARY OF ONCOLOGIC HISTORY: Oncology History   DCIS left breast     Breast cancer of upper-outer quadrant of left female breast   05/10/2009 Initial Diagnosis DCIS left breast Treated with left mastectomy 19.5 cm DCIS ER 0% PR 0%   09/09/2014 Relapse/Recurrence Ultrasound left breast: hypoechoic mass measuring 1.6 x 4.4 x 3.3 cm  MRI breast: 4 cm mass in the left breast close to the pectoralis, no lymphadenopathy   09/12/2014 Initial Biopsy Invasive ductal carcinoma ER 7%, PR 0%, HER-2 amplified ratio 3.84 average in copy #7.3   10/04/2014 -  Chemotherapy Neoadjuvant chemotherapy with Taxotere, carboplatin, Herceptin, Perjeta x6 cycles    CHIEF COMPLIANT: Followup of breast cancer, today is cycle 2 of neoadjuvant TCH B.  INTERVAL HISTORY: Aimee Poole is a [age]F mitral lady with above-mentioned history of recurrent breast cancer involving the left breast. She was given neoadjuvant chemotherapy with Taxotere, carboplatin, Herceptin and Perjeta. After cycle one she had profound diarrhea which did improve with Imodium but still persisted for several weeks and she even has loose stools every day. She has been started taking Imodium prophylactically. This seems to be helping. She denies any fevers or chills. Denies any problems with a port in terms of pain or discomfort.  REVIEW OF SYSTEMS:   Constitutional: Denies fevers, chills or abnormal weight loss; alopecia Eyes: Denies blurriness of vision Ears, nose, mouth, throat, and face: Denies mucositis or sore throat Respiratory: Denies cough, dyspnea or wheezes Cardiovascular: Denies palpitation, chest discomfort or lower extremity  swelling Gastrointestinal:  Denies nausea, heartburn or change in bowel habits Skin: Denies abnormal skin rashes Lymphatics: Denies new lymphadenopathy or easy bruising Neurological:Denies numbness, tingling or new weaknesses Behavioral/Psych: Mood is stable, no new changes  All other systems were reviewed with the patient and are negative.  I have reviewed the past medical history, past surgical history, social history and family history with the patient and they are unchanged from previous note.  ALLERGIES:  is allergic to corn-containing products.  MEDICATIONS:  Current Outpatient Prescriptions  Medication Sig Dispense Refill  . acetaminophen (TYLENOL) 325 MG tablet Take 2 tablets (650 mg total) by mouth every 6 (six) hours as needed. 60 tablet 1  . azithromycin (ZITHROMAX Z-PAK) 250 MG tablet Take by mouth, two tables (500 mg) on the first day and one tablet (250) daily until completed. 6 each 0  . dexamethasone (DECADRON) 4 MG tablet Take 2 tablets (8 mg total) by mouth 2 (two) times daily. Start the day before Taxotere. Then again the day after chemo for 3 days. 30 tablet 1  . diphenoxylate-atropine (LOMOTIL) 2.5-0.025 MG per tablet Take 1 tablet by mouth 4 (four) times daily as needed for diarrhea or loose stools. 30 tablet 0  . ibuprofen (ADVIL,MOTRIN) 100 MG chewable tablet Chew by mouth every 8 (eight) hours as needed.    . lidocaine-prilocaine (EMLA) cream Apply to affected area once 30 g 3  . lidocaine-prilocaine (EMLA) cream Apply to affected area once 30 g 3  . loperamide (IMODIUM) 2 MG capsule Take 1 capsule (2 mg total) by mouth as needed for diarrhea  or loose stools. 30 capsule 3  . loratadine (CLARITIN) 10 MG tablet Take 1 tablet (10 mg total) by mouth daily. 30 tablet 1  . LORazepam (ATIVAN) 0.5 MG tablet Take 1 tablet (0.5 mg total) by mouth every 6 (six) hours as needed (Nausea or vomiting). 30 tablet 0  . ondansetron (ZOFRAN) 8 MG tablet Take 1 tablet (8 mg total) by  mouth 2 (two) times daily. Start the day after chemo for 3 days. Then take as needed for nausea or vomiting. 30 tablet 1  . prochlorperazine (COMPAZINE) 10 MG tablet Take 1 tablet (10 mg total) by mouth every 6 (six) hours as needed (Nausea or vomiting). 30 tablet 1  . 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    PHYSICAL EXAMINATION: ECOG PERFORMANCE STATUS: 1 - Symptomatic but completely ambulatory  Filed Vitals:   10/25/14 1237  BP: 139/79  Pulse: 87  Temp: 98.3 F (36.8 C)  Resp: 18   Filed Weights   10/25/14 1237  Weight: 185 lb 1.6 oz (83.961 kg)    GENERAL:alert, no distress and comfortable SKIN: skin color, texture, turgor are normal, no rashes or significant lesions EYES: normal, Conjunctiva are pink and non-injected, sclera clear OROPHARYNX:no exudate, no erythema and lips, buccal mucosa, and tongue normal  NECK: supple, thyroid normal size, non-tender, without nodularity LYMPH:  no palpable lymphadenopathy in the cervical, axillary or inguinal LUNGS: clear to auscultation and percussion with normal breathing effort HEART: regular rate & rhythm and no murmurs and no lower extremity edema ABDOMEN:abdomen soft, non-tender and normal bowel sounds Musculoskeletal:no cyanosis of digits and no clubbing  NEURO: alert & oriented x 3 with fluent speech, no focal motor/sensory deficits  LABORATORY DATA:  I have reviewed the data as listed   Chemistry      Component Value Date/Time   NA 135* 10/11/2014 1040   NA 136* 09/23/2014 1140   K 4.5 10/11/2014 1040   K 4.4 09/23/2014 1140   CL 102 09/23/2014 1140   CO2 21* 10/11/2014 1040   CO2 22 09/23/2014 1140   BUN 7.5 10/11/2014 1040   BUN 8 09/23/2014 1140   CREATININE 0.8 10/11/2014 1040   CREATININE 0.63 09/23/2014 1140      Component Value Date/Time   CALCIUM 9.9 10/11/2014 1040    CALCIUM 10.3 09/23/2014 1140   ALKPHOS 99 10/11/2014 1040   ALKPHOS 91 11/01/2009 1212   AST 18 10/11/2014 1040   AST 22 11/01/2009 1212   ALT 23 10/11/2014 1040   ALT 22 11/01/2009 1212   BILITOT 0.24 10/11/2014 1040   BILITOT 0.6 11/01/2009 1212       Lab Results  Component Value Date   WBC 20.6* 10/25/2014   HGB 11.8 10/25/2014   HCT 34.2* 10/25/2014   MCV 84.2 10/25/2014   PLT 244 10/25/2014   NEUTROABS 19.3* 10/25/2014   ASSESSMENT & PLAN:  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 clinical stage ER 7% PR 0% HER-2 positive ratio 3.84 in the reconstructed left breast. Today is cycle 2 day 1 of TC HP  Diarrhea due to chemotherapy: I prescribed her Lomotil in addition to Imodium to help with his diarrhea. Return to clinic in one week for toxicity check and followup.  Abnormal discharge from the port: The wound  culture was negative for infection. She is afebrile and the port feels normal. Hence we will continue to use support and monitor closely.  Monitoring closely for toxicities  No orders of the defined types were placed in this encounter.   The patient has a good understanding of the overall plan. she agrees with it. She will call with any problems that may develop before her next visit here.   Rulon Eisenmenger, MD 10/25/2014 12:57 PM

## 2014-10-25 NOTE — Telephone Encounter (Signed)
, °

## 2014-10-25 NOTE — Addendum Note (Signed)
Addended by: Prentiss Bells on: 10/25/2014 01:45 PM   Modules accepted: Medications

## 2014-10-25 NOTE — Patient Instructions (Signed)
[facility] Discharge Instructions for Patients Receiving Chemotherapy  Today you received the following chemotherapy agents Herceptin/Perjeta/Taxotere/Carboplatin  To help prevent nausea and vomiting after your treatment, we encourage you to take your nausea medication    If you develop nausea and vomiting that is not controlled by your nausea medication, call the clinic.   BELOW ARE SYMPTOMS THAT SHOULD BE REPORTED IMMEDIATELY:  *FEVER GREATER THAN 100.5 F  *CHILLS WITH OR WITHOUT FEVER  NAUSEA AND VOMITING THAT IS NOT CONTROLLED WITH YOUR NAUSEA MEDICATION  *UNUSUAL SHORTNESS OF BREATH  *UNUSUAL BRUISING OR BLEEDING  TENDERNESS IN MOUTH AND THROAT WITH OR WITHOUT PRESENCE OF ULCERS  *URINARY PROBLEMS  *BOWEL PROBLEMS  UNUSUAL RASH Items with * indicate a potential emergency and should be followed up as soon as possible.  Feel free to call the clinic you have any questions or concerns. The clinic phone number is [phone].    

## 2014-10-26 ENCOUNTER — Ambulatory Visit (HOSPITAL_BASED_OUTPATIENT_CLINIC_OR_DEPARTMENT_OTHER): Payer: BC Managed Care – PPO

## 2014-10-26 DIAGNOSIS — C50412 Malignant neoplasm of upper-outer quadrant of left female breast: Secondary | ICD-10-CM

## 2014-10-26 DIAGNOSIS — Z5189 Encounter for other specified aftercare: Secondary | ICD-10-CM

## 2014-10-26 MED ORDER — PEGFILGRASTIM INJECTION 6 MG/0.6ML ~~LOC~~
6.0000 mg | PREFILLED_SYRINGE | Freq: Once | SUBCUTANEOUS | Status: AC
  Administered 2014-10-26: 6 mg via SUBCUTANEOUS
  Filled 2014-10-26: qty 0.6

## 2014-10-26 NOTE — Patient Instructions (Signed)
Pegfilgrastim injection What is this medicine? PEGFILGRASTIM (peg fil GRA stim) is a long-acting granulocyte colony-stimulating factor that stimulates the growth of neutrophils, a type of white blood cell important in the body's fight against infection. It is used to reduce the incidence of fever and infection in patients with certain types of cancer who are receiving chemotherapy that affects the bone marrow. This medicine may be used for other purposes; ask your health care provider or pharmacist if you have questions. COMMON BRAND NAME(S): Neulasta What should I tell my health care provider before I take this medicine? They need to know if you have any of these conditions: -latex allergy -ongoing radiation therapy -sickle cell disease -skin reactions to acrylic adhesives (On-Body Injector only) -an unusual or allergic reaction to pegfilgrastim, filgrastim, other medicines, foods, dyes, or preservatives -pregnant or trying to get pregnant -breast-feeding How should I use this medicine? This medicine is for injection under the skin. If you get this medicine at home, you will be taught how to prepare and give the pre-filled syringe or how to use the On-body Injector. Refer to the patient Instructions for Use for detailed instructions. Use exactly as directed. Take your medicine at regular intervals. Do not take your medicine more often than directed. It is important that you put your used needles and syringes in a special sharps container. Do not put them in a trash can. If you do not have a sharps container, call your pharmacist or healthcare provider to get one. Talk to your pediatrician regarding the use of this medicine in children. Special care may be needed. Overdosage: If you think you have taken too much of this medicine contact a poison control center or emergency room at once. NOTE: This medicine is only for you. Do not share this medicine with others. What if I miss a dose? It is  important not to miss your dose. Call your doctor or health care professional if you miss your dose. If you miss a dose due to an On-body Injector failure or leakage, a new dose should be administered as soon as possible using a single prefilled syringe for manual use. What may interact with this medicine? Interactions have not been studied. Give your health care provider a list of all the medicines, herbs, non-prescription drugs, or dietary supplements you use. Also tell them if you smoke, drink alcohol, or use illegal drugs. Some items may interact with your medicine. This list may not describe all possible interactions. Give your health care provider a list of all the medicines, herbs, non-prescription drugs, or dietary supplements you use. Also tell them if you smoke, drink alcohol, or use illegal drugs. Some items may interact with your medicine. What should I watch for while using this medicine? You may need blood work done while you are taking this medicine. If you are going to need a MRI, CT scan, or other procedure, tell your doctor that you are using this medicine (On-Body Injector only). What side effects may I notice from receiving this medicine? Side effects that you should report to your doctor or health care professional as soon as possible: -allergic reactions like skin rash, itching or hives, swelling of the face, lips, or tongue -dizziness -fever -pain, redness, or irritation at site where injected -pinpoint red spots on the skin -shortness of breath or breathing problems -stomach or side pain, or pain at the shoulder -swelling -tiredness -trouble passing urine Side effects that usually do not require medical attention (report to your doctor   or health care professional if they continue or are bothersome): -bone pain -muscle pain This list may not describe all possible side effects. Call your doctor for medical advice about side effects. You may report side effects to FDA at  1-800-FDA-1088. Where should I keep my medicine? Keep out of the reach of children. Store pre-filled syringes in a refrigerator between 2 and 8 degrees C (36 and 46 degrees F). Do not freeze. Keep in carton to protect from light. Throw away this medicine if it is left out of the refrigerator for more than 48 hours. Throw away any unused medicine after the expiration date. NOTE: This sheet is a summary. It may not cover all possible information. If you have questions about this medicine, talk to your doctor, pharmacist, or health care provider.  2015, [employer]vier/Gold Standard. (2014-02-10 16:14:05)  

## 2014-10-31 ENCOUNTER — Other Ambulatory Visit: Payer: Self-pay | Admitting: *Deleted

## 2014-10-31 DIAGNOSIS — C50412 Malignant neoplasm of upper-outer quadrant of left female breast: Secondary | ICD-10-CM

## 2014-11-01 ENCOUNTER — Telehealth: Payer: Self-pay | Admitting: *Deleted

## 2014-11-01 ENCOUNTER — Telehealth: Payer: Self-pay | Admitting: Hematology and Oncology

## 2014-11-01 ENCOUNTER — Ambulatory Visit: Payer: BC Managed Care – PPO | Admitting: Hematology and Oncology

## 2014-11-01 ENCOUNTER — Ambulatory Visit (HOSPITAL_BASED_OUTPATIENT_CLINIC_OR_DEPARTMENT_OTHER): Payer: BC Managed Care – PPO | Admitting: Hematology and Oncology

## 2014-11-01 ENCOUNTER — Other Ambulatory Visit (HOSPITAL_BASED_OUTPATIENT_CLINIC_OR_DEPARTMENT_OTHER): Payer: BC Managed Care – PPO

## 2014-11-01 VITALS — BP 121/67 | HR 96 | Temp 98.2°F | Resp 19 | Ht 61.0 in | Wt 182.2 lb

## 2014-11-01 DIAGNOSIS — C50412 Malignant neoplasm of upper-outer quadrant of left female breast: Secondary | ICD-10-CM

## 2014-11-01 DIAGNOSIS — D702 Other drug-induced agranulocytosis: Secondary | ICD-10-CM

## 2014-11-01 DIAGNOSIS — R197 Diarrhea, unspecified: Secondary | ICD-10-CM

## 2014-11-01 LAB — CBC WITH DIFFERENTIAL/PLATELET
BASO%: 0.3 % (ref 0.0–2.0)
BASOS ABS: 0 10*3/uL (ref 0.0–0.1)
EOS%: 0.3 % (ref 0.0–7.0)
Eosinophils Absolute: 0 10*3/uL (ref 0.0–0.5)
HEMATOCRIT: 35.3 % (ref 34.8–46.6)
HEMOGLOBIN: 11.8 g/dL (ref 11.6–15.9)
LYMPH%: 39.4 % (ref 14.0–49.7)
MCH: 28.6 pg (ref 25.1–34.0)
MCHC: 33.4 g/dL (ref 31.5–36.0)
MCV: 85.7 fL (ref 79.5–101.0)
MONO#: 0.5 10*3/uL (ref 0.1–0.9)
MONO%: 12.8 % (ref 0.0–14.0)
NEUT%: 47.2 % (ref 38.4–76.8)
NEUTROS ABS: 1.7 10*3/uL (ref 1.5–6.5)
Platelets: 181 10*3/uL (ref 145–400)
RBC: 4.12 10*6/uL (ref 3.70–5.45)
RDW: 13 % (ref 11.2–14.5)
WBC: 3.6 10*3/uL — ABNORMAL LOW (ref 3.9–10.3)
lymph#: 1.4 10*3/uL (ref 0.9–3.3)

## 2014-11-01 LAB — COMPREHENSIVE METABOLIC PANEL (CC13)
ALBUMIN: 3.7 g/dL (ref 3.5–5.0)
ALK PHOS: 99 U/L (ref 40–150)
ALT: 20 U/L (ref 0–55)
AST: 18 U/L (ref 5–34)
Anion Gap: 7 mEq/L (ref 3–11)
BUN: 7.4 mg/dL (ref 7.0–26.0)
CALCIUM: 10 mg/dL (ref 8.4–10.4)
CHLORIDE: 106 meq/L (ref 98–109)
CO2: 24 mEq/L (ref 22–29)
Creatinine: 0.7 mg/dL (ref 0.6–1.1)
GLUCOSE: 98 mg/dL (ref 70–140)
Potassium: 4.2 mEq/L (ref 3.5–5.1)
Sodium: 137 mEq/L (ref 136–145)
Total Bilirubin: 0.46 mg/dL (ref 0.20–1.20)
Total Protein: 7 g/dL (ref 6.4–8.3)

## 2014-11-01 NOTE — Telephone Encounter (Signed)
mailed sch

## 2014-11-01 NOTE — Progress Notes (Signed)
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 The genes tested were ATM, BARD1, BRCA1, BRCA2, BRIP1, CDH1, CHEK2, MRE11A, MUTYH, NBN, NF1, PALB2, PTEN, RAD50, RAD51C, RAD51D, and TP53.    CHIEF COMPLIANT: cycle 2 day 8 of TCHP  INTERVAL HISTORY: Aimee Poole is a 63 paragraph [place] with above-mentioned history of invasive ductal carcinoma of the left breast being treated with neoadjuvant chemotherapy with Taxotere, carboplatin, Herceptin and Perjeta. Today is cycle 2 day 8 and is here for toxicity check. After this cycle of chemotherapy diarrhea was not too severe. She was taking Imodium which is preventing her diarrhea. Her family is also noted that she has a lot of emotional outbursts/irritability. Mild nausea is present but no vomiting. For especially meats do not taste well.  REVIEW OF SYSTEMS:   Constitutional: Denies fevers, chills or abnormal weight loss Eyes: Denies blurriness of  vision Ears, nose, mouth, throat, and face: Denies mucositis or sore throat Respiratory: Denies cough, dyspnea or wheezes Cardiovascular: Denies palpitation, chest discomfort or lower extremity swelling Gastrointestinal:  Denies nausea, heartburn or change in bowel habits Skin: Denies abnormal skin rashes Lymphatics: Denies new lymphadenopathy or easy bruising Neurological:Denies numbness, tingling or new weaknesses Behavioral/Psych: Mood is stable, no new changes  All other systems were reviewed with the patient and are negative.  I have reviewed the past medical history, past surgical history, social history and family history with the patient and they are unchanged from previous note.  ALLERGIES:  is allergic to corn-containing products.  MEDICATIONS:  Current Outpatient Prescriptions  Medication Sig Dispense Refill  . acetaminophen (TYLENOL) 325 MG tablet Take 2 tablets (650 mg total) by mouth every 6 (six) hours as needed. 60 tablet 1  . azithromycin (ZITHROMAX Z-PAK) 250 MG tablet Take by mouth, two tables (500 mg) on the first day and one tablet (250) daily until completed. 6 each 0  . dexamethasone (DECADRON) 4 MG tablet Take 2 tablets (8 mg total) by mouth 2 (two) times daily. Start the day before Taxotere. Then again the day after chemo for 3 days. 30 tablet 1  . diphenoxylate-atropine (LOMOTIL) 2.5-0.025 MG per tablet Take 1 tablet by mouth 4 (four) times daily as needed for diarrhea or loose stools. 30 tablet 0  . ibuprofen (ADVIL,MOTRIN) 100 MG chewable tablet Chew by mouth every 8 (eight) hours as needed.    . lidocaine-prilocaine (EMLA) cream Apply to affected area once 30 g  3  . lidocaine-prilocaine (EMLA) cream Apply to affected area once 30 g 3  . loperamide (IMODIUM) 2 MG capsule Take 1 capsule (2 mg total) by mouth as needed for diarrhea or loose stools. 30 capsule 3  . loratadine (CLARITIN) 10 MG tablet Take 1 tablet (10 mg total) by mouth daily. 30 tablet 1  .  LORazepam (ATIVAN) 0.5 MG tablet Take 1 tablet (0.5 mg total) by mouth every 6 (six) hours as needed (Nausea or vomiting). 30 tablet 0  . ondansetron (ZOFRAN) 8 MG tablet Take 1 tablet (8 mg total) by mouth 2 (two) times daily. Start the day after chemo for 3 days. Then take as needed for nausea or vomiting. 30 tablet 1  . prochlorperazine (COMPAZINE) 10 MG tablet Take 1 tablet (10 mg total) by mouth every 6 (six) hours as needed (Nausea or vomiting). 30 tablet 1  . 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    PHYSICAL EXAMINATION: ECOG PERFORMANCE STATUS: 1 - Symptomatic but completely ambulatory  Filed Vitals:   11/01/14 0824  BP: 121/67  Pulse: 96  Temp: 98.2 F (36.8 C)  Resp: 19   Filed Weights   11/01/14 0824  Weight: 182 lb 3.2 oz (82.645 kg)    GENERAL:alert, no distress and comfortable SKIN: skin color, texture, turgor are normal, no rashes or significant lesions EYES: normal, Conjunctiva are pink and non-injected, sclera clear OROPHARYNX:no exudate, no erythema and lips, buccal mucosa, and tongue normal  LUNGS: clear to auscultation and percussion with normal breathing effort HEART: regular rate & rhythm and no murmurs and no lower extremity edema ABDOMEN:abdomen soft, non-tender and normal bowel sounds Musculoskeletal:no cyanosis of digits and no clubbing  NEURO: alert & oriented x 3 with fluent speech, no focal motor/sensory deficits  LABORATORY DATA:  I have reviewed the data as listed   Chemistry      Component Value Date/Time   NA 139 10/25/2014 1200   NA 136* 09/23/2014 1140   K 4.2 10/25/2014 1200   K 4.4 09/23/2014 1140   CL 102 09/23/2014 1140   CO2 23 10/25/2014 1200   CO2 22 09/23/2014 1140   BUN 10.4 10/25/2014 1200   BUN 8 09/23/2014 1140   CREATININE 0.7 10/25/2014 1200   CREATININE 0.63 09/23/2014 1140       Component Value Date/Time   CALCIUM 11.1* 10/25/2014 1200   CALCIUM 10.3 09/23/2014 1140   ALKPHOS 116 10/25/2014 1200   ALKPHOS 91 11/01/2009 1212   AST 25 10/25/2014 1200   AST 22 11/01/2009 1212   ALT 33 10/25/2014 1200   ALT 22 11/01/2009 1212   BILITOT 0.23 10/25/2014 1200   BILITOT 0.6 11/01/2009 1212       Lab Results  Component Value Date   WBC 3.6* 11/01/2014   HGB 11.8 11/01/2014   HCT 35.3 11/01/2014   MCV 85.7 11/01/2014   PLT 181 11/01/2014   NEUTROABS 1.7 11/01/2014   ASSESSMENT & PLAN:  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 clinical stage ER 7% PR 0% HER-2 positive ratio 3.84 in the reconstructed left breast. Today is cycle 2 day 8 of TC HP  Toxicities to Chemo 1. Diarrhea: with the addition of Lomotil to Imodium the diarrhea is much improved 2. Abnormal discharge from the  port: no longer a problem previously cultures were negative 3. Alopecia 4. Fatigue due to chemotherapy 5. Neutropenia related to chemotherapy 6. Nausea without vomiting 7. Emotional outbursts: I encouraged her to decrease the dexamethasone dosage to one pill twice a day with chemotherapy.  Monitoring closely for toxicities that chemotherapy. Patient would like to see Dr. Donne Hazel after third cycle of chemotherapy to discuss surgical options. Return to clinic in 2 weeks for cycle 3.says she has done so well with chemotherapy, we may not need to see her for nadir count checks with subsequent treatments.   Orders Placed This Encounter  Procedures  . CBC with Differential    Standing Status: Future     Number of Occurrences:      Standing Expiration Date: 11/01/2015  . Comprehensive metabolic panel (Cmet) - [facility]    Standing Status: Future     Number of Occurrences:      Standing Expiration Date: 11/01/2015   The patient has a good understanding of the overall plan. she agrees with it. She  will call with any problems that may develop before her next visit here.   Rulon Eisenmenger, MD 11/01/2014 8:47 AM

## 2014-11-01 NOTE — Assessment & Plan Note (Addendum)
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 clinical stage ER 7% PR 0% HER-2 positive ratio 3.84 in the reconstructed left breast. Today is cycle 2 day 8 of TC HP  Toxicities to Chemo 1. Diarrhea: with the addition of Lomotil to Imodium the diarrhea is much improved 2. Abnormal discharge from the port: no longer a problem previously cultures were negative 3. Alopecia 4. Fatigue due to chemotherapy 5. Neutropenia related to chemotherapy 6. Nausea without vomiting 7. Emotional outbursts: I encouraged her to decrease the dexamethasone dosage to one pill twice a day with chemotherapy.  Monitoring closely for toxicities that chemotherapy. Patient would like to see Dr. Donne Hazel after third cycle of chemotherapy to discuss surgical options. Return to clinic in 2 weeks for cycle 3.says she has done so well with chemotherapy, we may not need to see her for nadir count checks with subsequent treatments.

## 2014-11-01 NOTE — Telephone Encounter (Signed)
per pof to sch pt appt-sent MW email to sch pt trmt-pt req to mail copy of sch-she has next appt time

## 2014-11-01 NOTE — Telephone Encounter (Signed)
Per staff message and POF I have scheduled appts. Advised scheduler of appts. JMW  

## 2014-11-08 ENCOUNTER — Other Ambulatory Visit: Payer: Self-pay

## 2014-11-08 DIAGNOSIS — C50412 Malignant neoplasm of upper-outer quadrant of left female breast: Secondary | ICD-10-CM

## 2014-11-08 MED ORDER — DIPHENOXYLATE-ATROPINE 2.5-0.025 MG PO TABS: 1.0000 | ORAL_TABLET | Freq: Four times a day (QID) | ORAL | Status: DC | PRN

## 2014-11-08 NOTE — Telephone Encounter (Signed)
Lomotil prescription called in to [facility].  LMOVM for pt that scrip is at pharmacy.  Pt to call clinic if she has any questions.

## 2014-11-15 ENCOUNTER — Ambulatory Visit (HOSPITAL_BASED_OUTPATIENT_CLINIC_OR_DEPARTMENT_OTHER): Payer: BC Managed Care – PPO | Admitting: Lab

## 2014-11-15 ENCOUNTER — Ambulatory Visit (HOSPITAL_BASED_OUTPATIENT_CLINIC_OR_DEPARTMENT_OTHER): Payer: BC Managed Care – PPO

## 2014-11-15 ENCOUNTER — Ambulatory Visit (HOSPITAL_BASED_OUTPATIENT_CLINIC_OR_DEPARTMENT_OTHER): Payer: BC Managed Care – PPO | Admitting: Hematology and Oncology

## 2014-11-15 ENCOUNTER — Other Ambulatory Visit: Payer: BC Managed Care – PPO

## 2014-11-15 ENCOUNTER — Telehealth: Payer: Self-pay | Admitting: Hematology and Oncology

## 2014-11-15 ENCOUNTER — Ambulatory Visit: Payer: BC Managed Care – PPO | Admitting: Hematology and Oncology

## 2014-11-15 VITALS — BP 136/79 | HR 90 | Temp 98.7°F | Resp 18 | Ht 61.0 in | Wt 182.6 lb

## 2014-11-15 DIAGNOSIS — Z5111 Encounter for antineoplastic chemotherapy: Secondary | ICD-10-CM

## 2014-11-15 DIAGNOSIS — C50412 Malignant neoplasm of upper-outer quadrant of left female breast: Secondary | ICD-10-CM

## 2014-11-15 DIAGNOSIS — R197 Diarrhea, unspecified: Secondary | ICD-10-CM

## 2014-11-15 DIAGNOSIS — D702 Other drug-induced agranulocytosis: Secondary | ICD-10-CM

## 2014-11-15 DIAGNOSIS — Z5112 Encounter for antineoplastic immunotherapy: Secondary | ICD-10-CM

## 2014-11-15 LAB — COMPREHENSIVE METABOLIC PANEL (CC13)
ALBUMIN: 4 g/dL (ref 3.5–5.0)
ALT: 31 U/L (ref 0–55)
ANION GAP: 9 meq/L (ref 3–11)
AST: 23 U/L (ref 5–34)
Alkaline Phosphatase: 107 U/L (ref 40–150)
BUN: 7.8 mg/dL (ref 7.0–26.0)
CALCIUM: 10.8 mg/dL — AB (ref 8.4–10.4)
CO2: 22 mEq/L (ref 22–29)
Chloride: 109 mEq/L (ref 98–109)
Creatinine: 0.9 mg/dL (ref 0.6–1.1)
GLUCOSE: 122 mg/dL (ref 70–140)
Potassium: 4.4 mEq/L (ref 3.5–5.1)
SODIUM: 140 meq/L (ref 136–145)
TOTAL PROTEIN: 7.4 g/dL (ref 6.4–8.3)
Total Bilirubin: 0.21 mg/dL (ref 0.20–1.20)

## 2014-11-15 LAB — CBC WITH DIFFERENTIAL/PLATELET
BASO%: 0.2 % (ref 0.0–2.0)
Basophils Absolute: 0 10*3/uL (ref 0.0–0.1)
EOS ABS: 0 10*3/uL (ref 0.0–0.5)
EOS%: 0 % (ref 0.0–7.0)
HCT: 35.9 % (ref 34.8–46.6)
HGB: 11.8 g/dL (ref 11.6–15.9)
LYMPH%: 3.6 % — ABNORMAL LOW (ref 14.0–49.7)
MCH: 28.6 pg (ref 25.1–34.0)
MCHC: 32.8 g/dL (ref 31.5–36.0)
MCV: 86.9 fL (ref 79.5–101.0)
MONO#: 0.3 10*3/uL (ref 0.1–0.9)
MONO%: 1.3 % (ref 0.0–14.0)
NEUT#: 18.5 10*3/uL — ABNORMAL HIGH (ref 1.5–6.5)
NEUT%: 94.9 % — AB (ref 38.4–76.8)
Platelets: 335 10*3/uL (ref 145–400)
RBC: 4.13 10*6/uL (ref 3.70–5.45)
RDW: 14.7 % — ABNORMAL HIGH (ref 11.2–14.5)
WBC: 19.6 10*3/uL — AB (ref 3.9–10.3)
lymph#: 0.7 10*3/uL — ABNORMAL LOW (ref 0.9–3.3)

## 2014-11-15 MED ORDER — HEPARIN SOD (PORK) LOCK FLUSH 100 UNIT/ML IV SOLN
500.0000 [IU] | Freq: Once | INTRAVENOUS | Status: AC | PRN
  Administered 2014-11-15: 500 [IU]
  Filled 2014-11-15: qty 5

## 2014-11-15 MED ORDER — DEXAMETHASONE SODIUM PHOSPHATE 20 MG/5ML IJ SOLN
20.0000 mg | Freq: Once | INTRAMUSCULAR | Status: AC
  Administered 2014-11-15: 20 mg via INTRAVENOUS

## 2014-11-15 MED ORDER — CARBOPLATIN CHEMO INJECTION 600 MG/60ML
750.0000 mg | Freq: Once | INTRAVENOUS | Status: AC
  Administered 2014-11-15: 750 mg via INTRAVENOUS
  Filled 2014-11-15: qty 75

## 2014-11-15 MED ORDER — DIPHENHYDRAMINE HCL 25 MG PO CAPS
50.0000 mg | ORAL_CAPSULE | Freq: Once | ORAL | Status: AC
  Administered 2014-11-15: 50 mg via ORAL

## 2014-11-15 MED ORDER — TRASTUZUMAB CHEMO INJECTION 440 MG
6.0000 mg/kg | Freq: Once | INTRAVENOUS | Status: AC
  Administered 2014-11-15: 504 mg via INTRAVENOUS
  Filled 2014-11-15: qty 24

## 2014-11-15 MED ORDER — ACETAMINOPHEN 325 MG PO TABS
ORAL_TABLET | ORAL | Status: AC
  Filled 2014-11-15: qty 2

## 2014-11-15 MED ORDER — ONDANSETRON 16 MG/50ML IVPB (CHCC)
16.0000 mg | Freq: Once | INTRAVENOUS | Status: AC
  Administered 2014-11-15: 16 mg via INTRAVENOUS

## 2014-11-15 MED ORDER — SODIUM CHLORIDE 0.9 % IV SOLN
Freq: Once | INTRAVENOUS | Status: AC
  Administered 2014-11-15: 11:00:00 via INTRAVENOUS

## 2014-11-15 MED ORDER — SODIUM CHLORIDE 0.9 % IV SOLN
420.0000 mg | Freq: Once | INTRAVENOUS | Status: AC
  Administered 2014-11-15: 420 mg via INTRAVENOUS
  Filled 2014-11-15: qty 14

## 2014-11-15 MED ORDER — DIPHENHYDRAMINE HCL 25 MG PO CAPS
ORAL_CAPSULE | ORAL | Status: AC
  Filled 2014-11-15: qty 2

## 2014-11-15 MED ORDER — ACETAMINOPHEN 325 MG PO TABS
650.0000 mg | ORAL_TABLET | Freq: Once | ORAL | Status: AC
  Administered 2014-11-15: 650 mg via ORAL

## 2014-11-15 MED ORDER — DOCETAXEL CHEMO INJECTION 160 MG/16ML
75.0000 mg/m2 | Freq: Once | INTRAVENOUS | Status: AC
  Administered 2014-11-15: 140 mg via INTRAVENOUS
  Filled 2014-11-15: qty 14

## 2014-11-15 MED ORDER — SODIUM CHLORIDE 0.9 % IJ SOLN
10.0000 mL | INTRAMUSCULAR | Status: DC | PRN
  Administered 2014-11-15: 10 mL
  Filled 2014-11-15: qty 10

## 2014-11-15 MED ORDER — ONDANSETRON 16 MG/50ML IVPB (CHCC)
INTRAVENOUS | Status: AC
  Filled 2014-11-15: qty 16

## 2014-11-15 MED ORDER — DEXAMETHASONE SODIUM PHOSPHATE 20 MG/5ML IJ SOLN
INTRAMUSCULAR | Status: AC
  Filled 2014-11-15: qty 5

## 2014-11-15 NOTE — Telephone Encounter (Signed)
, °

## 2014-11-15 NOTE — Progress Notes (Signed)
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 The genes tested were ATM, BARD1, BRCA1, BRCA2, BRIP1, CDH1, CHEK2, MRE11A, MUTYH, NBN, NF1, PALB2, PTEN, RAD50, RAD51C, RAD51D, and TP53.    CHIEF COMPLIANT: Cycle 3 day 1 of chemotherapy with TCH Perjeta  INTERVAL HISTORY: Aimee Poole is a 43 year old F Aimee Poole with above-mentioned history of left-sided breast cancer being treated with neoadjuvant chemotherapy with Taxotere carboplatin Herceptin and Perjeta. She tolerated cycle 2 much better he denies any vomiting does feel occasionally nauseated. Denies any neuropathy symptoms. Has good appetite and taste. Continues to have emotional outbursts once in a while. She reports that she has not had any menstrual cycles since starting chemotherapy.  REVIEW OF SYSTEMS:   Constitutional: Denies fevers, chills or abnormal weight loss Eyes: Denies blurriness of vision Ears, nose, mouth, throat, and face:  Denies mucositis or sore throat Respiratory: Denies cough, dyspnea or wheezes Cardiovascular: Denies palpitation, chest discomfort or lower extremity swelling Gastrointestinal:  Denies nausea, heartburn or change in bowel habits Skin: Denies abnormal skin rashes Lymphatics: Denies new lymphadenopathy or easy bruising Neurological:Denies numbness, tingling or new weaknesses Behavioral/Psych: Mood is stable, no new changes  Breast:  denies any pain or lumps or nodules in either breasts, patient cannot tell if the cancer is smaller All other systems were reviewed with the patient and are negative.  I have reviewed the past medical history, past surgical history, social history and family history with the patient and they are unchanged from previous note.  ALLERGIES:  is allergic to corn-containing products.  MEDICATIONS:  Current Outpatient Prescriptions  Medication Sig Dispense Refill  . acetaminophen (TYLENOL) 325 MG tablet Take 2 tablets (650 mg total) by mouth every 6 (six) hours as needed. 60 tablet 1  . azithromycin (ZITHROMAX Z-PAK) 250 MG tablet Take by mouth, two tables (500 mg) on the first day and one tablet (250) daily until completed. 6 each 0  . dexamethasone (DECADRON) 4 MG tablet Take 2 tablets (8 mg total) by mouth 2 (two) times daily. Start the day before Taxotere. Then again the day after chemo for 3 days. 30 tablet 1  . diphenoxylate-atropine (LOMOTIL) 2.5-0.025 MG per tablet Take 1 tablet by mouth 4 (four) times daily as needed for diarrhea or loose stools. 30 tablet 0  . ibuprofen (ADVIL,MOTRIN) 100 MG chewable tablet Chew by mouth every 8 (eight) hours as needed.    . lidocaine-prilocaine (EMLA) cream Apply  to affected area once 30 g 3  . lidocaine-prilocaine (EMLA) cream Apply to affected area once 30 g 3  . loperamide (IMODIUM) 2 MG capsule Take 1 capsule (2 mg total) by mouth as needed for diarrhea or loose stools. 30 capsule 3  . loratadine (CLARITIN) 10 MG tablet  Take 1 tablet (10 mg total) by mouth daily. 30 tablet 1  . LORazepam (ATIVAN) 0.5 MG tablet Take 1 tablet (0.5 mg total) by mouth every 6 (six) hours as needed (Nausea or vomiting). 30 tablet 0  . ondansetron (ZOFRAN) 8 MG tablet Take 1 tablet (8 mg total) by mouth 2 (two) times daily. Start the day after chemo for 3 days. Then take as needed for nausea or vomiting. 30 tablet 1  . prochlorperazine (COMPAZINE) 10 MG tablet Take 1 tablet (10 mg total) by mouth every 6 (six) hours as needed (Nausea or vomiting). 30 tablet 1  . 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    PHYSICAL EXAMINATION: ECOG PERFORMANCE STATUS: 1 - Symptomatic but completely ambulatory  Filed Vitals:   11/15/14 0951  BP: 136/79  Pulse: 90  Temp: 98.7 F (37.1 C)  Resp: 18   Filed Weights   11/15/14 0951  Weight: 182 lb 9.6 oz (82.827 kg)    GENERAL:alert, no distress and comfortable SKIN: skin color, texture, turgor are normal, no rashes or significant lesions EYES: normal, Conjunctiva are pink and non-injected, sclera clear OROPHARYNX:no exudate, no erythema and lips, buccal mucosa, and tongue normal  NECK: supple, thyroid normal size, non-tender, without nodularity LYMPH:  no palpable lymphadenopathy in the cervical, axillary or inguinal LUNGS: clear to auscultation and percussion with normal breathing effort HEART: regular rate & rhythm and no murmurs and no lower extremity edema ABDOMEN:abdomen soft, non-tender and normal bowel sounds Musculoskeletal:no cyanosis of digits and no clubbing  NEURO: alert & oriented x 3 with fluent speech, no focal motor/sensory deficits   LABORATORY DATA:  I have reviewed the data as listed   Chemistry      Component Value Date/Time   NA 140 11/15/2014 0931   NA 136* 09/23/2014 1140   K 4.4 11/15/2014 0931   K 4.4 09/23/2014 1140    CL 102 09/23/2014 1140   CO2 22 11/15/2014 0931   CO2 22 09/23/2014 1140   BUN 7.8 11/15/2014 0931   BUN 8 09/23/2014 1140   CREATININE 0.9 11/15/2014 0931   CREATININE 0.63 09/23/2014 1140      Component Value Date/Time   CALCIUM 10.8* 11/15/2014 0931   CALCIUM 10.3 09/23/2014 1140   ALKPHOS 107 11/15/2014 0931   ALKPHOS 91 11/01/2009 1212   AST 23 11/15/2014 0931   AST 22 11/01/2009 1212   ALT 31 11/15/2014 0931   ALT 22 11/01/2009 1212   BILITOT 0.21 11/15/2014 0931   BILITOT 0.6 11/01/2009 1212       Lab Results  Component Value Date   WBC 19.6* 11/15/2014   HGB 11.8 11/15/2014   HCT 35.9 11/15/2014   MCV 86.9 11/15/2014   PLT 335 11/15/2014   NEUTROABS 18.5* 11/15/2014   ASSESSMENT & PLAN:  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 clinical stage ER 7% PR 0% HER-2 positive ratio 3.84 in the reconstructed left breast. Today is cycle 3 day 1  of TCHP  Toxicities to Chemo 1. Diarrhea: with the addition of Lomotil to Imodium the diarrhea is much improved 2. Abnormal discharge from the port: no longer a problem previously cultures were negative 3. Alopecia 4. Fatigue due to chemotherapy 5. Neutropenia related to chemotherapy: Monitored closely 6. Nausea without vomiting: Improved with antiemetic regimen adjustments 7. Emotional outbursts: Decreased the dexamethasone dosage to one pill twice a day with chemotherapy.  Monitoring closely for toxicities that chemotherapy. Patient would like to see Dr. Donne Hazel after third cycle of chemotherapy to discuss surgical options. Return to clinic in 3 weeks for cycle 4.since she has done so well with chemotherapy, we may not need to see her for nadir count checks with subsequent treatments.   Return to clinic in 3 weeks for cycle #4   Orders Placed This Encounter  Procedures  . CBC with Differential    Standing Status: Future      Number of Occurrences:      Standing Expiration Date: 11/15/2015  . Comprehensive metabolic panel (Cmet) - [facility]    Standing Status: Future     Number of Occurrences:      Standing Expiration Date: 11/15/2015   The patient has a good understanding of the overall plan. she agrees with it. She will call with any problems that may develop before her next visit here.   Rulon Eisenmenger, MD 11/15/2014 10:26 AM

## 2014-11-15 NOTE — Patient Instructions (Signed)
[facility] Discharge Instructions for Patients Receiving Chemotherapy  Today you received the following chemotherapy agents Herceptin/Perjeta/Taxotere/Carboplatin  To help prevent nausea and vomiting after your treatment, we encourage you to take your nausea medication    If you develop nausea and vomiting that is not controlled by your nausea medication, call the clinic.   BELOW ARE SYMPTOMS THAT SHOULD BE REPORTED IMMEDIATELY:  *FEVER GREATER THAN 100.5 F  *CHILLS WITH OR WITHOUT FEVER  NAUSEA AND VOMITING THAT IS NOT CONTROLLED WITH YOUR NAUSEA MEDICATION  *UNUSUAL SHORTNESS OF BREATH  *UNUSUAL BRUISING OR BLEEDING  TENDERNESS IN MOUTH AND THROAT WITH OR WITHOUT PRESENCE OF ULCERS  *URINARY PROBLEMS  *BOWEL PROBLEMS  UNUSUAL RASH Items with * indicate a potential emergency and should be followed up as soon as possible.  Feel free to call the clinic you have any questions or concerns. The clinic phone number is [phone].

## 2014-11-15 NOTE — Assessment & Plan Note (Signed)
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 clinical stage ER 7% PR 0% HER-2 positive ratio 3.84 in the reconstructed left breast. Today is cycle 3 day 1 of TCHP  Toxicities to Chemo 1. Diarrhea: with the addition of Lomotil to Imodium the diarrhea is much improved 2. Abnormal discharge from the port: no longer a problem previously cultures were negative 3. Alopecia 4. Fatigue due to chemotherapy 5. Neutropenia related to chemotherapy: Monitored closely 6. Nausea without vomiting: Improved with antiemetic regimen adjustments 7. Emotional outbursts: Decreased the dexamethasone dosage to one pill twice a day with chemotherapy.  Monitoring closely for toxicities that chemotherapy. Patient would like to see Dr. Donne Hazel after third cycle of chemotherapy to discuss surgical options. Return to clinic in 3 weeks for cycle 4.since she has done so well with chemotherapy, we may not need to see her for nadir count checks with subsequent treatments.   Return to clinic in 3 weeks for cycle #4

## 2014-11-16 ENCOUNTER — Ambulatory Visit (HOSPITAL_BASED_OUTPATIENT_CLINIC_OR_DEPARTMENT_OTHER): Payer: BC Managed Care – PPO

## 2014-11-16 DIAGNOSIS — Z5189 Encounter for other specified aftercare: Secondary | ICD-10-CM

## 2014-11-16 DIAGNOSIS — C50412 Malignant neoplasm of upper-outer quadrant of left female breast: Secondary | ICD-10-CM

## 2014-11-16 MED ORDER — PEGFILGRASTIM INJECTION 6 MG/0.6ML ~~LOC~~
6.0000 mg | PREFILLED_SYRINGE | Freq: Once | SUBCUTANEOUS | Status: AC
  Administered 2014-11-16: 6 mg via SUBCUTANEOUS
  Filled 2014-11-16: qty 0.6

## 2014-11-16 NOTE — Patient Instructions (Signed)
Pegfilgrastim injection What is this medicine? PEGFILGRASTIM (peg fil GRA stim) is a long-acting granulocyte colony-stimulating factor that stimulates the growth of neutrophils, a type of white blood cell important in the body's fight against infection. It is used to reduce the incidence of fever and infection in patients with certain types of cancer who are receiving chemotherapy that affects the bone marrow. This medicine may be used for other purposes; ask your health care provider or pharmacist if you have questions. COMMON BRAND NAME(S): Neulasta What should I tell my health care provider before I take this medicine? They need to know if you have any of these conditions: -latex allergy -ongoing radiation therapy -sickle cell disease -skin reactions to acrylic adhesives (On-Body Injector only) -an unusual or allergic reaction to pegfilgrastim, filgrastim, other medicines, foods, dyes, or preservatives -pregnant or trying to get pregnant -breast-feeding How should I use this medicine? This medicine is for injection under the skin. If you get this medicine at home, you will be taught how to prepare and give the pre-filled syringe or how to use the On-body Injector. Refer to the patient Instructions for Use for detailed instructions. Use exactly as directed. Take your medicine at regular intervals. Do not take your medicine more often than directed. It is important that you put your used needles and syringes in a special sharps container. Do not put them in a trash can. If you do not have a sharps container, call your pharmacist or healthcare provider to get one. Talk to your pediatrician regarding the use of this medicine in children. Special care may be needed. Overdosage: If you think you have taken too much of this medicine contact a poison control center or emergency room at once. NOTE: This medicine is only for you. Do not share this medicine with others. What if I miss a dose? It is  important not to miss your dose. Call your doctor or health care professional if you miss your dose. If you miss a dose due to an On-body Injector failure or leakage, a new dose should be administered as soon as possible using a single prefilled syringe for manual use. What may interact with this medicine? Interactions have not been studied. Give your health care provider a list of all the medicines, herbs, non-prescription drugs, or dietary supplements you use. Also tell them if you smoke, drink alcohol, or use illegal drugs. Some items may interact with your medicine. This list may not describe all possible interactions. Give your health care provider a list of all the medicines, herbs, non-prescription drugs, or dietary supplements you use. Also tell them if you smoke, drink alcohol, or use illegal drugs. Some items may interact with your medicine. What should I watch for while using this medicine? You may need blood work done while you are taking this medicine. If you are going to need a MRI, CT scan, or other procedure, tell your doctor that you are using this medicine (On-Body Injector only). What side effects may I notice from receiving this medicine? Side effects that you should report to your doctor or health care professional as soon as possible: -allergic reactions like skin rash, itching or hives, swelling of the face, lips, or tongue -dizziness -fever -pain, redness, or irritation at site where injected -pinpoint red spots on the skin -shortness of breath or breathing problems -stomach or side pain, or pain at the shoulder -swelling -tiredness -trouble passing urine Side effects that usually do not require medical attention (report to your doctor   or health care professional if they continue or are bothersome): -bone pain -muscle pain This list may not describe all possible side effects. Call your doctor for medical advice about side effects. You may report side effects to FDA at  1-800-FDA-1088. Where should I keep my medicine? Keep out of the reach of children. Store pre-filled syringes in a refrigerator between 2 and 8 degrees C (36 and 46 degrees F). Do not freeze. Keep in carton to protect from light. Throw away this medicine if it is left out of the refrigerator for more than 48 hours. Throw away any unused medicine after the expiration date. NOTE: This sheet is a summary. It may not cover all possible information. If you have questions about this medicine, talk to your doctor, pharmacist, or health care provider.  2015, [employer]vier/Gold Standard. (2014-02-10 16:14:05)  

## 2014-11-21 ENCOUNTER — Other Ambulatory Visit: Payer: Self-pay

## 2014-11-21 DIAGNOSIS — C50412 Malignant neoplasm of upper-outer quadrant of left female breast: Secondary | ICD-10-CM

## 2014-11-21 MED ORDER — LORAZEPAM 0.5 MG PO TABS
0.5000 mg | ORAL_TABLET | Freq: Four times a day (QID) | ORAL | Status: DC | PRN
Start: 2014-11-21 — End: 2015-01-16

## 2014-11-21 NOTE — Telephone Encounter (Signed)
lmovm - ativan refilled to [facility].  Pt to call clinic if she has any questions.

## 2014-11-25 HISTORY — PX: BREAST LUMPECTOMY: SHX2

## 2014-12-04 ENCOUNTER — Other Ambulatory Visit: Payer: Self-pay | Admitting: Hematology and Oncology

## 2014-12-06 ENCOUNTER — Telehealth: Payer: Self-pay | Admitting: Hematology and Oncology

## 2014-12-06 ENCOUNTER — Other Ambulatory Visit (HOSPITAL_BASED_OUTPATIENT_CLINIC_OR_DEPARTMENT_OTHER): Payer: BLUE CROSS/BLUE SHIELD

## 2014-12-06 ENCOUNTER — Telehealth: Payer: Self-pay | Admitting: *Deleted

## 2014-12-06 ENCOUNTER — Ambulatory Visit (HOSPITAL_BASED_OUTPATIENT_CLINIC_OR_DEPARTMENT_OTHER): Payer: BLUE CROSS/BLUE SHIELD | Admitting: Hematology and Oncology

## 2014-12-06 ENCOUNTER — Ambulatory Visit (HOSPITAL_BASED_OUTPATIENT_CLINIC_OR_DEPARTMENT_OTHER): Payer: BLUE CROSS/BLUE SHIELD

## 2014-12-06 ENCOUNTER — Other Ambulatory Visit: Payer: BC Managed Care – PPO

## 2014-12-06 ENCOUNTER — Other Ambulatory Visit: Payer: Self-pay | Admitting: Hematology and Oncology

## 2014-12-06 VITALS — BP 155/90 | HR 90 | Temp 99.3°F | Wt 180.9 lb

## 2014-12-06 DIAGNOSIS — C50412 Malignant neoplasm of upper-outer quadrant of left female breast: Secondary | ICD-10-CM

## 2014-12-06 DIAGNOSIS — Z170 Estrogen receptor positive status [ER+]: Secondary | ICD-10-CM

## 2014-12-06 DIAGNOSIS — R197 Diarrhea, unspecified: Secondary | ICD-10-CM

## 2014-12-06 DIAGNOSIS — R21 Rash and other nonspecific skin eruption: Secondary | ICD-10-CM

## 2014-12-06 DIAGNOSIS — Z5111 Encounter for antineoplastic chemotherapy: Secondary | ICD-10-CM

## 2014-12-06 DIAGNOSIS — Z5112 Encounter for antineoplastic immunotherapy: Secondary | ICD-10-CM

## 2014-12-06 DIAGNOSIS — D701 Agranulocytosis secondary to cancer chemotherapy: Secondary | ICD-10-CM

## 2014-12-06 DIAGNOSIS — R530 Neoplastic (malignant) related fatigue: Secondary | ICD-10-CM

## 2014-12-06 DIAGNOSIS — R112 Nausea with vomiting, unspecified: Secondary | ICD-10-CM

## 2014-12-06 LAB — CBC WITH DIFFERENTIAL/PLATELET
BASO%: 0.4 % (ref 0.0–2.0)
Basophils Absolute: 0.1 10*3/uL (ref 0.0–0.1)
EOS%: 0 % (ref 0.0–7.0)
Eosinophils Absolute: 0 10*3/uL (ref 0.0–0.5)
HCT: 33.8 % — ABNORMAL LOW (ref 34.8–46.6)
HGB: 11.3 g/dL — ABNORMAL LOW (ref 11.6–15.9)
LYMPH%: 3.2 % — AB (ref 14.0–49.7)
MCH: 29.5 pg (ref 25.1–34.0)
MCHC: 33.4 g/dL (ref 31.5–36.0)
MCV: 88.3 fL (ref 79.5–101.0)
MONO#: 0.1 10*3/uL (ref 0.1–0.9)
MONO%: 0.9 % (ref 0.0–14.0)
NEUT#: 12.9 10*3/uL — ABNORMAL HIGH (ref 1.5–6.5)
NEUT%: 95.5 % — ABNORMAL HIGH (ref 38.4–76.8)
PLATELETS: 272 10*3/uL (ref 145–400)
RBC: 3.82 10*6/uL (ref 3.70–5.45)
RDW: 16.4 % — ABNORMAL HIGH (ref 11.2–14.5)
WBC: 13.5 10*3/uL — ABNORMAL HIGH (ref 3.9–10.3)
lymph#: 0.4 10*3/uL — ABNORMAL LOW (ref 0.9–3.3)

## 2014-12-06 LAB — COMPREHENSIVE METABOLIC PANEL (CC13)
ALBUMIN: 3.9 g/dL (ref 3.5–5.0)
ALT: 22 U/L (ref 0–55)
AST: 21 U/L (ref 5–34)
Alkaline Phosphatase: 99 U/L (ref 40–150)
Anion Gap: 7 mEq/L (ref 3–11)
BUN: 7.8 mg/dL (ref 7.0–26.0)
CO2: 23 mEq/L (ref 22–29)
Calcium: 10.6 mg/dL — ABNORMAL HIGH (ref 8.4–10.4)
Chloride: 108 mEq/L (ref 98–109)
Creatinine: 0.8 mg/dL (ref 0.6–1.1)
EGFR: >90 mL/min/{1.73_m2} (ref >90)
Glucose: 144 mg/dl — ABNORMAL HIGH (ref 70–140)
POTASSIUM: 4 meq/L (ref 3.5–5.1)
Sodium: 139 mEq/L (ref 136–145)
Total Bilirubin: 0.24 mg/dL (ref 0.20–1.20)
Total Protein: 7.2 g/dL (ref 6.4–8.3)

## 2014-12-06 MED ORDER — SODIUM CHLORIDE 0.9 % IV SOLN
420.0000 mg | Freq: Once | INTRAVENOUS | Status: AC
  Administered 2014-12-06: 420 mg via INTRAVENOUS
  Filled 2014-12-06: qty 14

## 2014-12-06 MED ORDER — HEPARIN SOD (PORK) LOCK FLUSH 100 UNIT/ML IV SOLN
500.0000 [IU] | Freq: Once | INTRAVENOUS | Status: AC | PRN
  Administered 2014-12-06: 500 [IU]
  Filled 2014-12-06: qty 5

## 2014-12-06 MED ORDER — ACETAMINOPHEN 325 MG PO TABS
ORAL_TABLET | ORAL | Status: AC
  Filled 2014-12-06: qty 2

## 2014-12-06 MED ORDER — ONDANSETRON 16 MG/50ML IVPB (CHCC)
16.0000 mg | Freq: Once | INTRAVENOUS | Status: AC
Start: 2014-12-06 — End: 2014-12-06
  Administered 2014-12-06: 16 mg via INTRAVENOUS

## 2014-12-06 MED ORDER — TRASTUZUMAB CHEMO INJECTION 440 MG
6.0000 mg/kg | Freq: Once | INTRAVENOUS | Status: AC
  Administered 2014-12-06: 504 mg via INTRAVENOUS
  Filled 2014-12-06: qty 24

## 2014-12-06 MED ORDER — SODIUM CHLORIDE 0.9 % IV SOLN
750.0000 mg | Freq: Once | INTRAVENOUS | Status: AC
  Administered 2014-12-06: 750 mg via INTRAVENOUS
  Filled 2014-12-06: qty 75

## 2014-12-06 MED ORDER — ACETAMINOPHEN 325 MG PO TABS
650.0000 mg | ORAL_TABLET | Freq: Once | ORAL | Status: AC
  Administered 2014-12-06: 650 mg via ORAL

## 2014-12-06 MED ORDER — SODIUM CHLORIDE 0.9 % IV SOLN
Freq: Once | INTRAVENOUS | Status: AC
  Administered 2014-12-06: 11:00:00 via INTRAVENOUS

## 2014-12-06 MED ORDER — DEXAMETHASONE SODIUM PHOSPHATE 20 MG/5ML IJ SOLN
INTRAMUSCULAR | Status: AC
  Filled 2014-12-06: qty 5

## 2014-12-06 MED ORDER — DIPHENHYDRAMINE HCL 25 MG PO CAPS
ORAL_CAPSULE | ORAL | Status: AC
  Filled 2014-12-06: qty 2

## 2014-12-06 MED ORDER — DIPHENHYDRAMINE HCL 25 MG PO CAPS
50.0000 mg | ORAL_CAPSULE | Freq: Once | ORAL | Status: AC
  Administered 2014-12-06: 50 mg via ORAL

## 2014-12-06 MED ORDER — DOCETAXEL CHEMO INJECTION 160 MG/16ML
75.0000 mg/m2 | Freq: Once | INTRAVENOUS | Status: AC
  Administered 2014-12-06: 140 mg via INTRAVENOUS
  Filled 2014-12-06: qty 14

## 2014-12-06 MED ORDER — ONDANSETRON 16 MG/50ML IVPB (CHCC)
INTRAVENOUS | Status: AC
  Filled 2014-12-06: qty 16

## 2014-12-06 MED ORDER — DEXAMETHASONE SODIUM PHOSPHATE 20 MG/5ML IJ SOLN
20.0000 mg | Freq: Once | INTRAMUSCULAR | Status: AC
  Administered 2014-12-06: 20 mg via INTRAVENOUS

## 2014-12-06 MED ORDER — SODIUM CHLORIDE 0.9 % IJ SOLN
10.0000 mL | INTRAMUSCULAR | Status: DC | PRN
  Administered 2014-12-06: 10 mL
  Filled 2014-12-06: qty 10

## 2014-12-06 NOTE — Telephone Encounter (Signed)
Called pt with change in Neulasta appt time. She understands to come in at 3:15 for injection.

## 2014-12-06 NOTE — Progress Notes (Signed)
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 The genes tested were ATM, BARD1, BRCA1, BRCA2, BRIP1, CDH1, CHEK2, MRE11A, MUTYH, NBN, NF1, PALB2, PTEN, RAD50, RAD51C, RAD51D, and TP53.    CHIEF COMPLIANT: Cycle 4 TCH Perjeta  INTERVAL HISTORY: Aimee Poole is a 44 year old lady with above-mentioned history of left-sided breast cancer currently on neoadjuvant chemotherapy with TCH Perjeta. Today is cycle 4 of treatment. Since cycle 3 she had 3 episodes of vomiting. She does get loose stools once or twice a day. But when she takes Lomotil or Imodium for her diarrhea subsides. She endorses rash on her face as well as discoloration of the nails.  REVIEW OF SYSTEMS:   Constitutional: Denies fevers, chills or abnormal weight loss Eyes: Denies blurriness of vision Ears, nose, mouth, throat, and face: Denies mucositis or sore throat Respiratory: Denies cough, dyspnea or wheezes Cardiovascular: Denies  palpitation, chest discomfort or lower extremity swelling Gastrointestinal:  Denies nausea, heartburn or change in bowel habits Skin: Denies abnormal skin rashes Lymphatics: Denies new lymphadenopathy or easy bruising Neurological:Denies numbness, tingling or new weaknesses Behavioral/Psych: Mood is stable, no new changes  Breast: Pain in the breasts is resolved All other systems were reviewed with the patient and are negative.  I have reviewed the past medical history, past surgical history, social history and family history with the patient and they are unchanged from previous note.  ALLERGIES:  is allergic to corn-containing products.  MEDICATIONS:  Current Outpatient Prescriptions  Medication Sig Dispense Refill  . acetaminophen (TYLENOL) 325 MG tablet Take 2 tablets (650 mg total) by mouth every 6 (six) hours as needed. 60 tablet 1  . azithromycin (ZITHROMAX Z-PAK) 250 MG tablet Take by mouth, two tables (500 mg) on the first day and one tablet (250) daily until completed. 6 each 0  . dexamethasone (DECADRON) 4 MG tablet Take 2 tablets (8 mg total) by mouth 2 (two) times daily. Start the day before Taxotere. Then again the day after chemo for 3 days. 30 tablet 1  . diphenoxylate-atropine (LOMOTIL) 2.5-0.025 MG per tablet Take 1 tablet by mouth 4 (four) times daily as needed for diarrhea or loose stools. 30 tablet 0  . ibuprofen (ADVIL,MOTRIN) 100 MG chewable tablet Chew by mouth every 8 (eight) hours as needed.    . lidocaine-prilocaine (EMLA) cream Apply to affected area once 30 g 3  . lidocaine-prilocaine (EMLA) cream Apply to affected area once 30 g  3  . loperamide (IMODIUM) 2 MG capsule Take 1 capsule (2 mg total) by mouth as needed for diarrhea or loose stools. 30 capsule 3  . loratadine (CLARITIN) 10 MG tablet TAKE 1 TABLET (10 MG TOTAL) BY MOUTH DAILY. 30 tablet 1  . LORazepam (ATIVAN) 0.5 MG tablet Take 1 tablet (0.5 mg total) by mouth every 6 (six) hours as needed (Nausea or  vomiting). 30 tablet 2  . ondansetron (ZOFRAN) 8 MG tablet Take 1 tablet (8 mg total) by mouth 2 (two) times daily. Start the day after chemo for 3 days. Then take as needed for nausea or vomiting. 30 tablet 1  . prochlorperazine (COMPAZINE) 10 MG tablet Take 1 tablet (10 mg total) by mouth every 6 (six) hours as needed (Nausea or vomiting). 30 tablet 1  . 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    PHYSICAL EXAMINATION: ECOG PERFORMANCE STATUS: 1 - Symptomatic but completely ambulatory  Filed Vitals:   12/06/14 0952  BP: 155/90  Pulse: 90  Temp: 99.3 F (37.4 C)   Filed Weights   12/06/14 0952  Weight: 180 lb 14.4 oz (82.056 kg)    GENERAL:alert, no distress and comfortable SKIN: skin color, texture, turgor are normal, no rashes or significant lesions EYES: normal, Conjunctiva are pink and non-injected, sclera clear OROPHARYNX:no exudate, no erythema and lips, buccal mucosa, and tongue normal  NECK: supple, thyroid normal size, non-tender, without nodularity LYMPH:  no palpable lymphadenopathy in the cervical, axillary or inguinal LUNGS: clear to auscultation and percussion with normal breathing effort HEART: regular rate & rhythm and no murmurs and no lower extremity edema ABDOMEN:abdomen soft, non-tender and normal bowel sounds Musculoskeletal:no cyanosis of digits and no clubbing  NEURO: alert & oriented x 3 with fluent speech, no focal motor/sensory deficits  LABORATORY DATA:  I have reviewed the data as listed   Chemistry      Component Value Date/Time   NA 139 12/06/2014 0925   NA 136* 09/23/2014 1140   K 4.0 12/06/2014 0925   K 4.4 09/23/2014 1140   CL 102 09/23/2014 1140   CO2 23 12/06/2014 0925   CO2 22 09/23/2014 1140   BUN 7.8 12/06/2014 0925   BUN 8 09/23/2014 1140   CREATININE 0.8 12/06/2014 0925   CREATININE 0.63  09/23/2014 1140      Component Value Date/Time   CALCIUM 10.6* 12/06/2014 0925   CALCIUM 10.3 09/23/2014 1140   ALKPHOS 99 12/06/2014 0925   ALKPHOS 91 11/01/2009 1212   AST 21 12/06/2014 0925   AST 22 11/01/2009 1212   ALT 22 12/06/2014 0925   ALT 22 11/01/2009 1212   BILITOT 0.24 12/06/2014 0925   BILITOT 0.6 11/01/2009 1212       Lab Results  Component Value Date   WBC 13.5* 12/06/2014   HGB 11.3* 12/06/2014   HCT 33.8* 12/06/2014   MCV 88.3 12/06/2014   PLT 272 12/06/2014   NEUTROABS 12.9* 12/06/2014   ASSESSMENT & PLAN:  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 clinical stage ER 7% PR 0% HER-2 positive ratio 3.84 in the reconstructed left breast. Today is cycle 4 day 1 of TCHP  Toxicities to Chemo 1. Diarrhea: with the addition of Lomotil to Imodium the diarrhea is much improved 2. Abnormal discharge  from the port: no longer a problem previously cultures were negative 3. Alopecia 4. Fatigue due to chemotherapy 5. Neutropenia related to chemotherapy: Monitored closely 6. Nausea without vomiting: 3 episodes of vomiting. I discussed importance of taking antiemetics as prescribed 7. Emotional outbursts: Decreased the dexamethasone dosage to one pill twice a day with chemotherapy from cycle 3. 8. Nail discoloration 9. Dryness of hands and feet: I recommended thick moisturizing lotion 10. Rash on the face  Monitoring closely for toxicities that chemotherapy. Return to clinic in 3 weeks for cycle 5 at that time we will set her up for follow-up MRI.   Orders Placed This Encounter  Procedures  . CBC with Differential    Standing Status: Future     Number of Occurrences:      Standing Expiration Date: 12/06/2015  . Comprehensive metabolic panel (Cmet) - [facility]    Standing Status: Future     Number of Occurrences:      Standing Expiration Date: 12/06/2015   The patient has a good  understanding of the overall plan. she agrees with it. She will call with any problems that may develop before her next visit here.   Rulon Eisenmenger, MD 12/06/2014 10:22 AM

## 2014-12-06 NOTE — Patient Instructions (Signed)
[place] Discharge Instructions for Patients Receiving Chemotherapy  Today you received the following chemotherapy agents Herceptin, Perjeta, Taxotere and Carboplatin.  To help prevent nausea and vomiting after your treatment, we encourage you to take your nausea medication as prescribed.   If you develop nausea and vomiting that is not controlled by your nausea medication, call the clinic.   BELOW ARE SYMPTOMS THAT SHOULD BE REPORTED IMMEDIATELY:  *FEVER GREATER THAN 100.5 F  *CHILLS WITH OR WITHOUT FEVER  NAUSEA AND VOMITING THAT IS NOT CONTROLLED WITH YOUR NAUSEA MEDICATION  *UNUSUAL SHORTNESS OF BREATH  *UNUSUAL BRUISING OR BLEEDING  TENDERNESS IN MOUTH AND THROAT WITH OR WITHOUT PRESENCE OF ULCERS  *URINARY PROBLEMS  *BOWEL PROBLEMS  UNUSUAL RASH Items with * indicate a potential emergency and should be followed up as soon as possible.  Feel free to call the clinic you have any questions or concerns. The clinic phone number is [phone].

## 2014-12-06 NOTE — Assessment & Plan Note (Addendum)
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 clinical stage ER 7% PR 0% HER-2 positive ratio 3.84 in the reconstructed left breast. Today is cycle 4 day 1 of TCHP  Toxicities to Chemo 1. Diarrhea: with the addition of Lomotil to Imodium the diarrhea is much improved 2. Abnormal discharge from the port: no longer a problem previously cultures were negative 3. Alopecia 4. Fatigue due to chemotherapy 5. Neutropenia related to chemotherapy: Monitored closely 6. Nausea without vomiting: 3 episodes of vomiting. I discussed importance of taking antiemetics as prescribed 7. Emotional outbursts: Decreased the dexamethasone dosage to one pill twice a day with chemotherapy from cycle 3.  Monitoring closely for toxicities that chemotherapy. Return to clinic in 3 weeks for cycle 5 at that time we will set her up for follow-up MRI.

## 2014-12-06 NOTE — Telephone Encounter (Signed)
per pof tos ch pt appt-sent MW email to sch pt trmt-will call pt after reply °

## 2014-12-07 ENCOUNTER — Ambulatory Visit (HOSPITAL_BASED_OUTPATIENT_CLINIC_OR_DEPARTMENT_OTHER): Payer: BLUE CROSS/BLUE SHIELD

## 2014-12-07 DIAGNOSIS — C50412 Malignant neoplasm of upper-outer quadrant of left female breast: Secondary | ICD-10-CM

## 2014-12-07 DIAGNOSIS — Z5189 Encounter for other specified aftercare: Secondary | ICD-10-CM

## 2014-12-07 MED ORDER — PEGFILGRASTIM INJECTION 6 MG/0.6ML ~~LOC~~
6.0000 mg | PREFILLED_SYRINGE | Freq: Once | SUBCUTANEOUS | Status: AC
  Administered 2014-12-07: 6 mg via SUBCUTANEOUS
  Filled 2014-12-07: qty 0.6

## 2014-12-12 ENCOUNTER — Telehealth: Payer: Self-pay | Admitting: *Deleted

## 2014-12-12 NOTE — Telephone Encounter (Signed)
Patient called to state that she noticed blood when she had a bowel movement this am. Stated that that the blood was after the stool and she noticed "a few drops of bright red blood in toilet" as well as when she wiped. Advised patient that blood may be related to straining during bowel movement. Advised patient to increase fluids and fiber in diet, also may take stool softener. Advised patient to monitor bleeding and call if it increases. Patient verbalized understanding.

## 2014-12-20 ENCOUNTER — Other Ambulatory Visit (INDEPENDENT_AMBULATORY_CARE_PROVIDER_SITE_OTHER): Payer: Self-pay | Admitting: General Surgery

## 2014-12-20 ENCOUNTER — Other Ambulatory Visit (INDEPENDENT_AMBULATORY_CARE_PROVIDER_SITE_OTHER): Payer: Self-pay | Admitting: *Deleted

## 2014-12-20 DIAGNOSIS — C50412 Malignant neoplasm of upper-outer quadrant of left female breast: Secondary | ICD-10-CM

## 2014-12-21 ENCOUNTER — Encounter: Payer: Self-pay | Admitting: Hematology and Oncology

## 2014-12-27 ENCOUNTER — Ambulatory Visit: Payer: BC Managed Care – PPO

## 2014-12-27 ENCOUNTER — Other Ambulatory Visit: Payer: BC Managed Care – PPO

## 2014-12-27 ENCOUNTER — Ambulatory Visit: Payer: BLUE CROSS/BLUE SHIELD

## 2014-12-27 ENCOUNTER — Ambulatory Visit (HOSPITAL_BASED_OUTPATIENT_CLINIC_OR_DEPARTMENT_OTHER): Payer: BLUE CROSS/BLUE SHIELD

## 2014-12-27 ENCOUNTER — Ambulatory Visit (HOSPITAL_BASED_OUTPATIENT_CLINIC_OR_DEPARTMENT_OTHER): Payer: BLUE CROSS/BLUE SHIELD | Admitting: Hematology and Oncology

## 2014-12-27 ENCOUNTER — Telehealth: Payer: Self-pay | Admitting: Hematology and Oncology

## 2014-12-27 ENCOUNTER — Other Ambulatory Visit (HOSPITAL_BASED_OUTPATIENT_CLINIC_OR_DEPARTMENT_OTHER): Payer: BLUE CROSS/BLUE SHIELD

## 2014-12-27 VITALS — BP 133/71 | HR 85 | Temp 98.6°F | Resp 18 | Ht 61.0 in | Wt 181.8 lb

## 2014-12-27 DIAGNOSIS — Z5189 Encounter for other specified aftercare: Secondary | ICD-10-CM

## 2014-12-27 DIAGNOSIS — Z95828 Presence of other vascular implants and grafts: Secondary | ICD-10-CM

## 2014-12-27 DIAGNOSIS — R21 Rash and other nonspecific skin eruption: Secondary | ICD-10-CM

## 2014-12-27 DIAGNOSIS — D702 Other drug-induced agranulocytosis: Secondary | ICD-10-CM

## 2014-12-27 DIAGNOSIS — Z5111 Encounter for antineoplastic chemotherapy: Secondary | ICD-10-CM

## 2014-12-27 DIAGNOSIS — C50412 Malignant neoplasm of upper-outer quadrant of left female breast: Secondary | ICD-10-CM

## 2014-12-27 DIAGNOSIS — R530 Neoplastic (malignant) related fatigue: Secondary | ICD-10-CM

## 2014-12-27 LAB — COMPREHENSIVE METABOLIC PANEL (CC13)
ALBUMIN: 3.6 g/dL (ref 3.5–5.0)
ALT: 25 U/L (ref 0–55)
AST: 25 U/L (ref 5–34)
Alkaline Phosphatase: 91 U/L (ref 40–150)
Anion Gap: 7 mEq/L (ref 3–11)
BUN: 9.6 mg/dL (ref 7.0–26.0)
CHLORIDE: 111 meq/L — AB (ref 98–109)
CO2: 23 mEq/L (ref 22–29)
CREATININE: 0.7 mg/dL (ref 0.6–1.1)
Calcium: 10.2 mg/dL (ref 8.4–10.4)
EGFR: >90 mL/min/{1.73_m2} (ref >90)
Glucose: 103 mg/dl (ref 70–140)
POTASSIUM: 4 meq/L (ref 3.5–5.1)
SODIUM: 140 meq/L (ref 136–145)
Total Bilirubin: 0.28 mg/dL (ref 0.20–1.20)
Total Protein: 6.7 g/dL (ref 6.4–8.3)

## 2014-12-27 LAB — CBC WITH DIFFERENTIAL/PLATELET
BASO%: 0.4 % (ref 0.0–2.0)
Basophils Absolute: 0 10*3/uL (ref 0.0–0.1)
EOS ABS: 0 10*3/uL (ref 0.0–0.5)
EOS%: 0 % (ref 0.0–7.0)
HCT: 32.4 % — ABNORMAL LOW (ref 34.8–46.6)
HGB: 10.6 g/dL — ABNORMAL LOW (ref 11.6–15.9)
LYMPH#: 1 10*3/uL (ref 0.9–3.3)
LYMPH%: 10.5 % — ABNORMAL LOW (ref 14.0–49.7)
MCH: 30 pg (ref 25.1–34.0)
MCHC: 32.6 g/dL (ref 31.5–36.0)
MCV: 91.9 fL (ref 79.5–101.0)
MONO#: 0.6 10*3/uL (ref 0.1–0.9)
MONO%: 6.4 % (ref 0.0–14.0)
NEUT#: 8.1 10*3/uL — ABNORMAL HIGH (ref 1.5–6.5)
NEUT%: 82.7 % — ABNORMAL HIGH (ref 38.4–76.8)
Platelets: 192 10*3/uL (ref 145–400)
RBC: 3.52 10*6/uL — ABNORMAL LOW (ref 3.70–5.45)
RDW: 17.9 % — ABNORMAL HIGH (ref 11.2–14.5)
WBC: 9.8 10*3/uL (ref 3.9–10.3)

## 2014-12-27 MED ORDER — HEPARIN SOD (PORK) LOCK FLUSH 100 UNIT/ML IV SOLN
500.0000 [IU] | Freq: Once | INTRAVENOUS | Status: AC | PRN
  Administered 2014-12-27: 500 [IU]
  Filled 2014-12-27: qty 5

## 2014-12-27 MED ORDER — SODIUM CHLORIDE 0.9 % IV SOLN
750.0000 mg | Freq: Once | INTRAVENOUS | Status: AC
  Administered 2014-12-27: 750 mg via INTRAVENOUS
  Filled 2014-12-27: qty 75

## 2014-12-27 MED ORDER — ACETAMINOPHEN 325 MG PO TABS
ORAL_TABLET | ORAL | Status: AC
  Filled 2014-12-27: qty 2

## 2014-12-27 MED ORDER — ONDANSETRON 16 MG/50ML IVPB (CHCC)
16.0000 mg | Freq: Once | INTRAVENOUS | Status: AC
  Administered 2014-12-27: 16 mg via INTRAVENOUS

## 2014-12-27 MED ORDER — DOCETAXEL CHEMO INJECTION 160 MG/16ML
75.0000 mg/m2 | Freq: Once | INTRAVENOUS | Status: AC
  Administered 2014-12-27: 140 mg via INTRAVENOUS
  Filled 2014-12-27: qty 14

## 2014-12-27 MED ORDER — DEXAMETHASONE SODIUM PHOSPHATE 20 MG/5ML IJ SOLN
20.0000 mg | Freq: Once | INTRAMUSCULAR | Status: AC
  Administered 2014-12-27: 20 mg via INTRAVENOUS

## 2014-12-27 MED ORDER — SODIUM CHLORIDE 0.9 % IJ SOLN
10.0000 mL | INTRAMUSCULAR | Status: DC | PRN
  Administered 2014-12-27: 10 mL
  Filled 2014-12-27: qty 10

## 2014-12-27 MED ORDER — ONDANSETRON 16 MG/50ML IVPB (CHCC)
INTRAVENOUS | Status: AC
  Filled 2014-12-27: qty 16

## 2014-12-27 MED ORDER — DIPHENHYDRAMINE HCL 25 MG PO CAPS
ORAL_CAPSULE | ORAL | Status: AC
  Filled 2014-12-27: qty 2

## 2014-12-27 MED ORDER — SODIUM CHLORIDE 0.9 % IJ SOLN
10.0000 mL | INTRAMUSCULAR | Status: DC | PRN
  Administered 2014-12-27: 10 mL via INTRAVENOUS
  Filled 2014-12-27: qty 10

## 2014-12-27 MED ORDER — DEXAMETHASONE SODIUM PHOSPHATE 20 MG/5ML IJ SOLN
INTRAMUSCULAR | Status: AC
  Filled 2014-12-27: qty 5

## 2014-12-27 MED ORDER — PEGFILGRASTIM 6 MG/0.6ML ~~LOC~~ PSKT
6.0000 mg | PREFILLED_SYRINGE | Freq: Once | SUBCUTANEOUS | Status: AC
  Administered 2014-12-27: 6 mg via SUBCUTANEOUS
  Filled 2014-12-27: qty 0.6

## 2014-12-27 MED ORDER — SODIUM CHLORIDE 0.9 % IV SOLN
6.0000 mg/kg | Freq: Once | INTRAVENOUS | Status: AC
  Administered 2014-12-27: 504 mg via INTRAVENOUS
  Filled 2014-12-27: qty 24

## 2014-12-27 MED ORDER — PERTUZUMAB CHEMO INJECTION 420 MG/14ML
420.0000 mg | Freq: Once | INTRAVENOUS | Status: AC
  Administered 2014-12-27: 420 mg via INTRAVENOUS
  Filled 2014-12-27: qty 14

## 2014-12-27 MED ORDER — DIPHENHYDRAMINE HCL 25 MG PO CAPS
50.0000 mg | ORAL_CAPSULE | Freq: Once | ORAL | Status: AC
  Administered 2014-12-27: 50 mg via ORAL

## 2014-12-27 MED ORDER — SODIUM CHLORIDE 0.9 % IV SOLN
Freq: Once | INTRAVENOUS | Status: AC
  Administered 2014-12-27: 09:00:00 via INTRAVENOUS

## 2014-12-27 MED ORDER — ACETAMINOPHEN 325 MG PO TABS
650.0000 mg | ORAL_TABLET | Freq: Once | ORAL | Status: AC
  Administered 2014-12-27: 650 mg via ORAL

## 2014-12-27 NOTE — Patient Instructions (Signed)
[facility] [facility] Discharge Instructions for Patients Receiving Chemotherapy  Today you received the following chemotherapy agents: Herceptin, Perjeta, Taxotere, Carboplatin  To help prevent nausea and vomiting after your treatment, we encourage you to take your nausea medication as prescribed.   If you develop nausea and vomiting that is not controlled by your nausea medication, call the clinic.   BELOW ARE SYMPTOMS THAT SHOULD BE REPORTED IMMEDIATELY:  *FEVER GREATER THAN 100.5 F  *CHILLS WITH OR WITHOUT FEVER  NAUSEA AND VOMITING THAT IS NOT CONTROLLED WITH YOUR NAUSEA MEDICATION  *UNUSUAL SHORTNESS OF BREATH  *UNUSUAL BRUISING OR BLEEDING  TENDERNESS IN MOUTH AND THROAT WITH OR WITHOUT PRESENCE OF ULCERS  *URINARY PROBLEMS  *BOWEL PROBLEMS  UNUSUAL RASH Items with * indicate a potential emergency and should be followed up as soon as possible.  Feel free to call the clinic you have any questions or concerns. The clinic phone number is [phone].

## 2014-12-27 NOTE — Patient Instructions (Signed)

## 2014-12-27 NOTE — Progress Notes (Signed)
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 The genes tested were ATM, BARD1, BRCA1, BRCA2, BRIP1, CDH1, CHEK2, MRE11A, MUTYH, NBN, NF1, PALB2, PTEN, RAD50, RAD51C, RAD51D, and TP53.    CHIEF COMPLIANT: Cycle 5 TCH Perjeta  INTERVAL HISTORY: Aimee Poole is a 44 year old with above-mentioned history of left-sided invasive ductal carcinoma currently on neoadjuvant chemotherapy with [facility] Perjeta today is cycle #5. She has done very well from chemotherapy standpoint with very minimal side effects other than diarrhea and fatigue along with hair loss. She's got decent energy levels although towards the end of the day she does feel tired and sleepy. Denies any nausea vomiting. She has diarrhea on day 3 of chemotherapy. She takes Lomotil and that stops the diarrhea. She had seen Dr. Donne Hazel who set her up for a MRI of the breast on February 29. We will be  presenting her case in the tumor board on March 2.  REVIEW OF SYSTEMS:   Constitutional: Denies fevers, chills or abnormal weight loss Eyes: Denies blurriness of vision Ears, nose, mouth, throat, and face: Denies mucositis or sore throat Respiratory: Denies cough, dyspnea or wheezes Cardiovascular: Denies palpitation, chest discomfort or lower extremity swelling Gastrointestinal:  Denies nausea, heartburn or change in bowel habits Skin: Denies abnormal skin rashes Lymphatics: Denies new lymphadenopathy or easy bruising Neurological:Denies numbness, tingling or new weaknesses Behavioral/Psych: Mood is stable, no new changes  All other systems were reviewed with the patient and are negative.  I have reviewed the past medical history, past surgical history, social history and family history with the patient and they are unchanged from previous note.  ALLERGIES:  is allergic to corn-containing products.  MEDICATIONS:  Current Outpatient Prescriptions  Medication Sig Dispense Refill  . acetaminophen (TYLENOL) 325 MG tablet Take 2 tablets (650 mg total) by mouth every 6 (six) hours as needed. 60 tablet 1  . azithromycin (ZITHROMAX Z-PAK) 250 MG tablet Take by mouth, two tables (500 mg) on the first day and one tablet (250) daily until completed. 6 each 0  . dexamethasone (DECADRON) 4 MG tablet Take 2 tablets (8 mg total) by mouth 2 (two) times daily. Start the day before Taxotere. Then again the day after chemo for 3 days. 30 tablet 1  . diphenoxylate-atropine (LOMOTIL) 2.5-0.025 MG per tablet Take 1 tablet by mouth 4 (four) times daily as needed for diarrhea or loose stools. [place]wey  tablet 0  . ibuprofen (ADVIL,MOTRIN) 100 MG chewable tablet Chew by mouth every 8 (eight) hours as needed.    . lidocaine-prilocaine (EMLA) cream Apply to affected area once 30 g 3  . lidocaine-prilocaine (EMLA) cream Apply to affected area once 30 g 3  . loperamide (IMODIUM) 2 MG capsule Take 1 capsule (2 mg total) by  mouth as needed for diarrhea or loose stools. 30 capsule 3  . loratadine (CLARITIN) 10 MG tablet TAKE 1 TABLET (10 MG TOTAL) BY MOUTH DAILY. 30 tablet 1  . LORazepam (ATIVAN) 0.5 MG tablet Take 1 tablet (0.5 mg total) by mouth every 6 (six) hours as needed (Nausea or vomiting). 30 tablet 2  . ondansetron (ZOFRAN) 8 MG tablet Take 1 tablet (8 mg total) by mouth 2 (two) times daily. Start the day after chemo for 3 days. Then take as needed for nausea or vomiting. 30 tablet 1  . prochlorperazine (COMPAZINE) 10 MG tablet Take 1 tablet (10 mg total) by mouth every 6 (six) hours as needed (Nausea or vomiting). 30 tablet 1  . 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    PHYSICAL EXAMINATION: ECOG PERFORMANCE STATUS: 1 - Symptomatic but completely ambulatory  Filed Vitals:   12/27/14 0821  BP: 133/71  Pulse: 85  Temp: 98.6 F (37 C)  Resp: 18   Filed Weights   12/27/14 0821  Weight: 181 lb 12.8 oz (82.464 kg)    GENERAL:alert, no distress and comfortable SKIN: skin color, texture, turgor are normal, no rashes or significant lesions EYES: normal, Conjunctiva are pink and non-injected, sclera clear OROPHARYNX:no exudate, no erythema and lips, buccal mucosa, and tongue normal  NECK: supple, thyroid normal size, non-tender, without nodularity LYMPH:  no palpable lymphadenopathy in the cervical, axillary or inguinal LUNGS: clear to auscultation and percussion with normal breathing effort HEART: regular rate & rhythm and no murmurs and no lower extremity edema ABDOMEN:abdomen soft, non-tender and normal bowel sounds Musculoskeletal:no cyanosis of digits and no clubbing  NEURO: alert & oriented x 3 with fluent speech, no focal motor/sensory deficits  LABORATORY DATA:  I have reviewed the data as listed   Chemistry      Component Value Date/Time   NA 139  12/06/2014 0925   NA 136* 09/23/2014 1140   K 4.0 12/06/2014 0925   K 4.4 09/23/2014 1140   CL 102 09/23/2014 1140   CO2 23 12/06/2014 0925   CO2 22 09/23/2014 1140   BUN 7.8 12/06/2014 0925   BUN 8 09/23/2014 1140   CREATININE 0.8 12/06/2014 0925   CREATININE 0.63 09/23/2014 1140      Component Value Date/Time   CALCIUM 10.6* 12/06/2014 0925   CALCIUM 10.3 09/23/2014 1140   ALKPHOS 99 12/06/2014 0925   ALKPHOS 91 11/01/2009 1212   AST 21 12/06/2014 0925   AST 22 11/01/2009 1212   ALT 22 12/06/2014 0925   ALT 22 11/01/2009 1212   BILITOT 0.24 12/06/2014 0925   BILITOT 0.6 11/01/2009 1212       Lab Results  Component Value Date   WBC 9.8 12/27/2014   HGB 10.6* 12/27/2014   HCT 32.4* 12/27/2014   MCV 91.9 12/27/2014   PLT 192 12/27/2014   NEUTROABS 8.1* 12/27/2014   ASSESSMENT & PLAN:  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 clinical stage ER 7% PR 0% HER-2 positive ratio 3.84 in the reconstructed left breast. Today is cycle 5 day 1 of TCHP  Toxicities to Chemo 1. Diarrhea: with the addition of Lomotil to Imodium the diarrhea is much improved 2. Abnormal discharge from the port: no longer a problem previously cultures were negative 3. Alopecia 4. Fatigue due to chemotherapy 5. Neutropenia related to chemotherapy: Monitored closely 6. Nausea without vomiting: 3 episodes of vomiting. I discussed importance of taking antiemetics as prescribed 7. Emotional outbursts: Decreased the dexamethasone dosage to one pill twice a day with chemotherapy from cycle 3. 8. Nail discoloration 9. Dryness of hands and feet: I recommended thick moisturizing lotion 10. Rash on the face  Monitoring closely for toxicities that chemotherapy. Return to clinic in 3 weeks for cycle 6 after that, we will set her up for follow-up MRI.    No orders of the defined types were placed in this encounter.    The patient has a good understanding of the overall plan. she agrees with it. She will call with any problems that may develop before her next visit here.   Rulon Eisenmenger, MD

## 2014-12-27 NOTE — Assessment & Plan Note (Signed)
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 clinical stage ER 7% PR 0% HER-2 positive ratio 3.84 in the reconstructed left breast. Today is cycle 5 day 1 of TCHP  Toxicities to Chemo 1. Diarrhea: with the addition of Lomotil to Imodium the diarrhea is much improved 2. Abnormal discharge from the port: no longer a problem previously cultures were negative 3. Alopecia 4. Fatigue due to chemotherapy 5. Neutropenia related to chemotherapy: Monitored closely 6. Nausea without vomiting: 3 episodes of vomiting. I discussed importance of taking antiemetics as prescribed 7. Emotional outbursts: Decreased the dexamethasone dosage to one pill twice a day with chemotherapy from cycle 3. 8. Nail discoloration 9. Dryness of hands and feet: I recommended thick moisturizing lotion 10. Rash on the face  Monitoring closely for toxicities that chemotherapy. Return to clinic in 3 weeks for cycle 6 after that, we will set her up for follow-up MRI.

## 2014-12-27 NOTE — Progress Notes (Signed)
Patient watched on-body Neulasta injector patient video in infusion room before injector was applied.   Patient given patient information guide to take home and instructed to fill out postcard to get sharps container sent to her house. Answered all questions patient had regarding on-body injector and instructed patient to either call [facility] or the information number in the information guide if any questions arise. Told patient neulasta will infuse starting at approximently 4:15pm tomorrow and last for 45 minutes. Instructed patient to wait until at least 6pm to remove device.

## 2014-12-28 ENCOUNTER — Ambulatory Visit: Payer: BC Managed Care – PPO

## 2014-12-29 ENCOUNTER — Other Ambulatory Visit: Payer: Self-pay | Admitting: General Surgery

## 2014-12-29 DIAGNOSIS — C50912 Malignant neoplasm of unspecified site of left female breast: Secondary | ICD-10-CM

## 2014-12-30 ENCOUNTER — Other Ambulatory Visit: Payer: BLUE CROSS/BLUE SHIELD

## 2015-01-16 ENCOUNTER — Other Ambulatory Visit: Payer: Self-pay | Admitting: *Deleted

## 2015-01-16 ENCOUNTER — Other Ambulatory Visit: Payer: Self-pay

## 2015-01-16 ENCOUNTER — Other Ambulatory Visit: Payer: Self-pay | Admitting: Hematology and Oncology

## 2015-01-16 DIAGNOSIS — C50412 Malignant neoplasm of upper-outer quadrant of left female breast: Secondary | ICD-10-CM

## 2015-01-16 MED ORDER — LORAZEPAM 0.5 MG PO TABS: ORAL_TABLET | ORAL | Status: DC

## 2015-01-17 ENCOUNTER — Other Ambulatory Visit: Payer: BC Managed Care – PPO

## 2015-01-17 ENCOUNTER — Ambulatory Visit (HOSPITAL_BASED_OUTPATIENT_CLINIC_OR_DEPARTMENT_OTHER): Payer: BLUE CROSS/BLUE SHIELD | Admitting: Hematology and Oncology

## 2015-01-17 ENCOUNTER — Ambulatory Visit (HOSPITAL_BASED_OUTPATIENT_CLINIC_OR_DEPARTMENT_OTHER): Payer: BLUE CROSS/BLUE SHIELD

## 2015-01-17 ENCOUNTER — Ambulatory Visit: Payer: BC Managed Care – PPO

## 2015-01-17 ENCOUNTER — Other Ambulatory Visit (HOSPITAL_BASED_OUTPATIENT_CLINIC_OR_DEPARTMENT_OTHER): Payer: BLUE CROSS/BLUE SHIELD

## 2015-01-17 ENCOUNTER — Ambulatory Visit: Payer: BLUE CROSS/BLUE SHIELD

## 2015-01-17 ENCOUNTER — Telehealth: Payer: Self-pay | Admitting: Hematology and Oncology

## 2015-01-17 VITALS — BP 152/83 | HR 75 | Temp 98.4°F | Resp 18 | Ht 61.0 in | Wt 180.4 lb

## 2015-01-17 DIAGNOSIS — Z95828 Presence of other vascular implants and grafts: Secondary | ICD-10-CM

## 2015-01-17 DIAGNOSIS — D6481 Anemia due to antineoplastic chemotherapy: Secondary | ICD-10-CM

## 2015-01-17 DIAGNOSIS — C50412 Malignant neoplasm of upper-outer quadrant of left female breast: Secondary | ICD-10-CM

## 2015-01-17 DIAGNOSIS — D701 Agranulocytosis secondary to cancer chemotherapy: Secondary | ICD-10-CM

## 2015-01-17 DIAGNOSIS — Z170 Estrogen receptor positive status [ER+]: Secondary | ICD-10-CM

## 2015-01-17 DIAGNOSIS — Z5112 Encounter for antineoplastic immunotherapy: Secondary | ICD-10-CM

## 2015-01-17 DIAGNOSIS — Z5111 Encounter for antineoplastic chemotherapy: Secondary | ICD-10-CM

## 2015-01-17 LAB — CBC WITH DIFFERENTIAL/PLATELET
BASO%: 0.2 % (ref 0.0–2.0)
Basophils Absolute: 0 10*3/uL (ref 0.0–0.1)
EOS%: 0 % (ref 0.0–7.0)
Eosinophils Absolute: 0 10*3/uL (ref 0.0–0.5)
HEMATOCRIT: 30.7 % — AB (ref 34.8–46.6)
HEMOGLOBIN: 10.4 g/dL — AB (ref 11.6–15.9)
LYMPH#: 1.7 10*3/uL (ref 0.9–3.3)
LYMPH%: 15.4 % (ref 14.0–49.7)
MCH: 31.4 pg (ref 25.1–34.0)
MCHC: 33.9 g/dL (ref 31.5–36.0)
MCV: 92.7 fL (ref 79.5–101.0)
MONO#: 0.7 10*3/uL (ref 0.1–0.9)
MONO%: 6.7 % (ref 0.0–14.0)
NEUT%: 77.7 % — ABNORMAL HIGH (ref 38.4–76.8)
NEUTROS ABS: 8.4 10*3/uL — AB (ref 1.5–6.5)
PLATELETS: 214 10*3/uL (ref 145–400)
RBC: 3.31 10*6/uL — ABNORMAL LOW (ref 3.70–5.45)
RDW: 16.5 % — ABNORMAL HIGH (ref 11.2–14.5)
WBC: 10.9 10*3/uL — ABNORMAL HIGH (ref 3.9–10.3)
nRBC: 0 % (ref 0–0)

## 2015-01-17 LAB — COMPREHENSIVE METABOLIC PANEL (CC13)
ALT: 25 U/L (ref 0–55)
ANION GAP: 8 meq/L (ref 3–11)
AST: 26 U/L (ref 5–34)
Albumin: 3.6 g/dL (ref 3.5–5.0)
Alkaline Phosphatase: 101 U/L (ref 40–150)
BILIRUBIN TOTAL: 0.23 mg/dL (ref 0.20–1.20)
BUN: 7.4 mg/dL (ref 7.0–26.0)
CO2: 25 meq/L (ref 22–29)
CREATININE: 0.7 mg/dL (ref 0.6–1.1)
Calcium: 10.9 mg/dL — ABNORMAL HIGH (ref 8.4–10.4)
Chloride: 108 mEq/L (ref 98–109)
EGFR: >90 mL/min/{1.73_m2} (ref >90)
Glucose: 110 mg/dl (ref 70–140)
Potassium: 3.8 mEq/L (ref 3.5–5.1)
SODIUM: 140 meq/L (ref 136–145)
Total Protein: 6.7 g/dL (ref 6.4–8.3)

## 2015-01-17 MED ORDER — DEXAMETHASONE SODIUM PHOSPHATE 20 MG/5ML IJ SOLN
INTRAMUSCULAR | Status: AC
  Filled 2015-01-17: qty 5

## 2015-01-17 MED ORDER — ONDANSETRON 16 MG/50ML IVPB (CHCC)
16.0000 mg | Freq: Once | INTRAVENOUS | Status: AC
  Administered 2015-01-17: 16 mg via INTRAVENOUS

## 2015-01-17 MED ORDER — HEPARIN SOD (PORK) LOCK FLUSH 100 UNIT/ML IV SOLN
500.0000 [IU] | Freq: Once | INTRAVENOUS | Status: AC | PRN
  Administered 2015-01-17: 500 [IU]
  Filled 2015-01-17: qty 5

## 2015-01-17 MED ORDER — SODIUM CHLORIDE 0.9 % IV SOLN
Freq: Once | INTRAVENOUS | Status: AC
  Administered 2015-01-17: 11:00:00 via INTRAVENOUS

## 2015-01-17 MED ORDER — SODIUM CHLORIDE 0.9 % IV SOLN: Freq: Once | INTRAVENOUS | Status: DC

## 2015-01-17 MED ORDER — DIPHENHYDRAMINE HCL 25 MG PO CAPS
50.0000 mg | ORAL_CAPSULE | Freq: Once | ORAL | Status: AC
  Administered 2015-01-17: 50 mg via ORAL

## 2015-01-17 MED ORDER — SODIUM CHLORIDE 0.9 % IV SOLN
6.0000 mg/kg | Freq: Once | INTRAVENOUS | Status: AC
  Administered 2015-01-17: 504 mg via INTRAVENOUS
  Filled 2015-01-17: qty 24

## 2015-01-17 MED ORDER — PERTUZUMAB CHEMO INJECTION 420 MG/14ML
420.0000 mg | Freq: Once | INTRAVENOUS | Status: AC
  Administered 2015-01-17: 420 mg via INTRAVENOUS
  Filled 2015-01-17: qty 14

## 2015-01-17 MED ORDER — SODIUM CHLORIDE 0.9 % IV SOLN
750.0000 mg | Freq: Once | INTRAVENOUS | Status: AC
  Administered 2015-01-17: 750 mg via INTRAVENOUS
  Filled 2015-01-17: qty 75

## 2015-01-17 MED ORDER — SODIUM CHLORIDE 0.9 % IJ SOLN
10.0000 mL | INTRAMUSCULAR | Status: DC | PRN
  Administered 2015-01-17: 10 mL via INTRAVENOUS
  Filled 2015-01-17: qty 10

## 2015-01-17 MED ORDER — DOCETAXEL CHEMO INJECTION 160 MG/16ML
75.0000 mg/m2 | Freq: Once | INTRAVENOUS | Status: AC
  Administered 2015-01-17: 140 mg via INTRAVENOUS
  Filled 2015-01-17: qty 14

## 2015-01-17 MED ORDER — SODIUM CHLORIDE 0.9 % IJ SOLN
10.0000 mL | INTRAMUSCULAR | Status: DC | PRN
  Administered 2015-01-17: 10 mL
  Filled 2015-01-17: qty 10

## 2015-01-17 MED ORDER — ACETAMINOPHEN 325 MG PO TABS
ORAL_TABLET | ORAL | Status: AC
  Filled 2015-01-17: qty 2

## 2015-01-17 MED ORDER — DIPHENHYDRAMINE HCL 25 MG PO CAPS
ORAL_CAPSULE | ORAL | Status: AC
  Filled 2015-01-17: qty 2

## 2015-01-17 MED ORDER — PEGFILGRASTIM 6 MG/0.6ML ~~LOC~~ PSKT
6.0000 mg | PREFILLED_SYRINGE | Freq: Once | SUBCUTANEOUS | Status: AC
  Administered 2015-01-17: 6 mg via SUBCUTANEOUS
  Filled 2015-01-17: qty 0.6

## 2015-01-17 MED ORDER — ACETAMINOPHEN 325 MG PO TABS
650.0000 mg | ORAL_TABLET | Freq: Once | ORAL | Status: AC
  Administered 2015-01-17: 650 mg via ORAL

## 2015-01-17 MED ORDER — DEXAMETHASONE SODIUM PHOSPHATE 20 MG/5ML IJ SOLN
20.0000 mg | Freq: Once | INTRAMUSCULAR | Status: AC
  Administered 2015-01-17: 20 mg via INTRAVENOUS

## 2015-01-17 MED ORDER — ONDANSETRON 16 MG/50ML IVPB (CHCC)
INTRAVENOUS | Status: AC
  Filled 2015-01-17: qty 16

## 2015-01-17 NOTE — Addendum Note (Signed)
Addended by: Prentiss Bells on: 01/17/2015 01:25 PM   Modules accepted: Orders

## 2015-01-17 NOTE — Patient Instructions (Signed)
[facility] Discharge Instructions for Patients Receiving Chemotherapy  Today you received the following chemotherapy agents Taxotere, Carboplatin, Herceptin and Perjeta.  To help prevent nausea and vomiting after your treatment, we encourage you to take your nausea medication.   If you develop nausea and vomiting that is not controlled by your nausea medication, call the clinic.   BELOW ARE SYMPTOMS THAT SHOULD BE REPORTED IMMEDIATELY:  *FEVER GREATER THAN 100.5 F  *CHILLS WITH OR WITHOUT FEVER  NAUSEA AND VOMITING THAT IS NOT CONTROLLED WITH YOUR NAUSEA MEDICATION  *UNUSUAL SHORTNESS OF BREATH  *UNUSUAL BRUISING OR BLEEDING  TENDERNESS IN MOUTH AND THROAT WITH OR WITHOUT PRESENCE OF ULCERS  *URINARY PROBLEMS  *BOWEL PROBLEMS  UNUSUAL RASH Items with * indicate a potential emergency and should be followed up as soon as possible.  Feel free to call the clinic you have any questions or concerns. The clinic phone number is [phone].

## 2015-01-17 NOTE — Telephone Encounter (Signed)
, °

## 2015-01-17 NOTE — Assessment & Plan Note (Signed)
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 clinical stage ER 7% PR 0% HER-2 positive ratio 3.84 in the reconstructed left breast. Today is cycle 6 day 1 of TCHP  Toxicities to Chemo 1. Diarrhea: with the addition of Lomotil to Imodium the diarrhea is much improved 2. Abnormal discharge from the port: no longer a problem previously cultures were negative 3. Alopecia 4. Fatigue due to chemotherapy 5. Neutropenia related to chemotherapy: Monitored closely 6. Nausea without vomiting: 3 episodes of vomiting. I discussed importance of taking antiemetics as prescribed 7. Emotional outbursts: Decreased the dexamethasone dosage to one pill twice a day with chemotherapy from cycle 3. 8. Nail discoloration 9. Dryness of hands and feet: I recommended thick moisturizing lotion 10. Rash on the face  Monitoring closely for toxicities that chemotherapy. Return to clinic in 3 weeks for Herceptin maintenance.  Her case will be presented in the tumor board for discussion. Surgery consultation will be obtained regarding breast surgery following neoadjuvant chemotherapy.

## 2015-01-17 NOTE — Progress Notes (Signed)
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 The genes tested were ATM, BARD1, BRCA1, BRCA2, BRIP1, CDH1, CHEK2, MRE11A, MUTYH, NBN, NF1, PALB2, PTEN, RAD50, RAD51C, RAD51D, and TP53.    CHIEF COMPLIANT: Cycle 6 [facility] Perjeta  INTERVAL HISTORY: Aimee Poole is a 44 year old lady with above-mentioned history of left breast cancer currently on neoadjuvant chemotherapy with TCH Perjeta. Today's the last cycle of chemotherapy. She is very excited about it. She had one episode of nausea. Other than that no major problems or concerns. Denies any neuropathy.  REVIEW OF SYSTEMS:   Constitutional: Denies fevers, chills or abnormal weight loss Eyes: Denies blurriness of vision Ears, nose, mouth, throat, and face: Denies mucositis or sore throat Respiratory: Denies cough, dyspnea or wheezes Cardiovascular: Denies palpitation, chest discomfort or lower extremity  swelling Gastrointestinal:  Denies nausea, heartburn or change in bowel habits Skin: Denies abnormal skin rashes Lymphatics: Denies new lymphadenopathy or easy bruising Neurological:Denies numbness, tingling or new weaknesses Behavioral/Psych: Mood is stable, no new changes  Breast:  denies any pain or lumps or nodules in either breasts All other systems were reviewed with the patient and are negative.  I have reviewed the past medical history, past surgical history, social history and family history with the patient and they are unchanged from previous note.  ALLERGIES:  is allergic to corn-containing products.  MEDICATIONS:  Current Outpatient Prescriptions  Medication Sig Dispense Refill  . acetaminophen (TYLENOL) 325 MG tablet Take 2 tablets (650 mg total) by mouth every 6 (six) hours as needed. 60 tablet 1  . dexamethasone (DECADRON) 4 MG tablet 2TABS BY MOUTH TWICE A DAY START THE DAY BEFORE TAXOTERE THEN AGAIN DAY AFTER CHEMO X3DAYS 30 tablet 1  . diphenoxylate-atropine (LOMOTIL) 2.5-0.025 MG per tablet Take 1 tablet by mouth 4 (four) times daily as needed for diarrhea or loose stools. 30 tablet 0  . ibuprofen (ADVIL,MOTRIN) 100 MG chewable tablet Chew by mouth every 8 (eight) hours as needed.    . lidocaine-prilocaine (EMLA) cream Apply to affected area once 30 g 3  . loperamide (IMODIUM) 2 MG capsule Take 1 capsule (2 mg total) by mouth as needed for diarrhea or loose stools. 30 capsule 3  . loratadine (CLARITIN) 10 MG tablet TAKE 1 TABLET (10 MG TOTAL) BY MOUTH DAILY. 30 tablet 1  . LORazepam (ATIVAN) 0.5 MG tablet TAKE 1 TABLET BY MOUTH EVERY  6 HOURS AS NEEDED FOR NAUSEA AND VOMITING 30 tablet 0  . ondansetron (ZOFRAN) 8 MG tablet Take 1 tablet (8 mg total) by mouth 2 (two) times daily. Start the day after chemo for 3 days. Then take as needed for nausea or vomiting. 30 tablet 1  . prochlorperazine (COMPAZINE) 10 MG tablet Take 1 tablet (10 mg total) by mouth every 6 (six) hours  as needed (Nausea or vomiting). 30 tablet 1  . 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    PHYSICAL EXAMINATION: ECOG PERFORMANCE STATUS: 1 - Symptomatic but completely ambulatory  Filed Vitals:   01/17/15 0944  BP: 152/83  Pulse: 75  Temp: 98.4 F (36.9 C)  Resp: 18   Filed Weights   01/17/15 0944  Weight: 180 lb 6.4 oz (81.829 kg)    GENERAL:alert, no distress and comfortable SKIN: skin color, texture, turgor are normal, no rashes or significant lesions EYES: normal, Conjunctiva are pink and non-injected, sclera clear OROPHARYNX:no exudate, no erythema and lips, buccal mucosa, and tongue normal  NECK: supple, thyroid normal size, non-tender, without nodularity LYMPH:  no palpable lymphadenopathy in the cervical, axillary or inguinal LUNGS: clear to auscultation and percussion with normal breathing effort HEART: regular rate & rhythm and no murmurs and no lower extremity edema ABDOMEN:abdomen soft, non-tender and normal bowel sounds Musculoskeletal:no cyanosis of digits and no clubbing  NEURO: alert & oriented x 3 with fluent speech, no focal motor/sensory deficits  LABORATORY DATA:  I have reviewed the data as listed   Chemistry      Component Value Date/Time   NA 140 01/17/2015 0919   NA 136* 09/23/2014 1140   K 3.8 01/17/2015 0919   K 4.4 09/23/2014 1140   CL 102 09/23/2014 1140   CO2 25 01/17/2015 0919   CO2 22 09/23/2014 1140   BUN 7.4 01/17/2015 0919   BUN 8 09/23/2014 1140   CREATININE 0.7 01/17/2015 0919   CREATININE 0.63 09/23/2014 1140      Component Value Date/Time   CALCIUM 10.9* 01/17/2015 0919   CALCIUM 10.3 09/23/2014 1140   ALKPHOS 101 01/17/2015 0919   ALKPHOS 91 11/01/2009 1212   AST 26 01/17/2015 0919   AST 22 11/01/2009 1212   ALT 25 01/17/2015 0919   ALT 22 11/01/2009 1212   BILITOT 0.23 01/17/2015  0919   BILITOT 0.6 11/01/2009 1212       Lab Results  Component Value Date   WBC 10.9* 01/17/2015   HGB 10.4* 01/17/2015   HCT 30.7* 01/17/2015   MCV 92.7 01/17/2015   PLT 214 01/17/2015   NEUTROABS 8.4* 01/17/2015   ASSESSMENT & PLAN:  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 clinical stage ER 7% PR 0% HER-2 positive ratio 3.84 in the reconstructed left breast. Today is cycle 6 day 1 of TCHP  Toxicities to Chemo 1. Diarrhea: with the addition of Lomotil to Imodium the diarrhea is much improved 2. Abnormal discharge from the port: no longer a problem previously cultures were negative 3. Alopecia 4. Fatigue due to chemotherapy 5. Neutropenia related to chemotherapy: Monitored closely 6. Nausea without vomiting: 3 episodes of vomiting. I discussed importance of taking antiemetics as prescribed 7. Emotional outbursts: Decreased the dexamethasone dosage to one pill twice a day with chemotherapy from cycle  3. 8. Nail discoloration 9. Dryness of hands and feet: I recommended thick moisturizing lotion 10. Rash on the face: Fading away 11. Chemotherapy-induced anemia hemoglobin 10.4  Monitoring closely for toxicities that chemotherapy. Return to clinic in 3 weeks for Herceptin maintenance.  Her case will be presented in the tumor board for discussion. Surgery consultation will be obtained regarding breast surgery following neoadjuvant chemotherapy.    No orders of the defined types were placed in this encounter.   The patient has a good understanding of the overall plan. she agrees with it. She will call with any problems that may develop before her next visit here.   Rulon Eisenmenger, MD

## 2015-01-17 NOTE — Patient Instructions (Signed)

## 2015-01-18 ENCOUNTER — Ambulatory Visit: Payer: BC Managed Care – PPO

## 2015-01-23 ENCOUNTER — Ambulatory Visit
Admission: RE | Admit: 2015-01-23 | Discharge: 2015-01-23 | Disposition: A | Discharge status: Home / Self Care | Payer: BLUE CROSS/BLUE SHIELD | Source: Ambulatory Visit | Attending: General Surgery | Admitting: General Surgery

## 2015-01-23 DIAGNOSIS — C50412 Malignant neoplasm of upper-outer quadrant of left female breast: Secondary | ICD-10-CM

## 2015-01-24 ENCOUNTER — Emergency Department (HOSPITAL_COMMUNITY)
Admission: EM | Admit: 2015-01-24 | Discharge: 2015-01-24 | Disposition: A | Discharge status: Home / Self Care | Payer: BLUE CROSS/BLUE SHIELD | Attending: Emergency Medicine | Admitting: Emergency Medicine

## 2015-01-24 ENCOUNTER — Encounter (HOSPITAL_COMMUNITY): Payer: Self-pay | Admitting: *Deleted

## 2015-01-24 ENCOUNTER — Ambulatory Visit (HOSPITAL_COMMUNITY): Payer: BLUE CROSS/BLUE SHIELD

## 2015-01-24 DIAGNOSIS — Z853 Personal history of malignant neoplasm of breast: Secondary | ICD-10-CM | POA: Insufficient documentation

## 2015-01-24 DIAGNOSIS — R55 Syncope and collapse: Secondary | ICD-10-CM | POA: Diagnosis present

## 2015-01-24 DIAGNOSIS — I951 Orthostatic hypotension: Secondary | ICD-10-CM | POA: Diagnosis not present

## 2015-01-24 DIAGNOSIS — Z79899 Other long term (current) drug therapy: Secondary | ICD-10-CM | POA: Diagnosis not present

## 2015-01-24 DIAGNOSIS — Z7952 Long term (current) use of systemic steroids: Secondary | ICD-10-CM | POA: Insufficient documentation

## 2015-01-24 DIAGNOSIS — C50912 Malignant neoplasm of unspecified site of left female breast: Secondary | ICD-10-CM

## 2015-01-24 HISTORY — DX: Malignant neoplasm of unspecified site of left female breast: C50.912

## 2015-01-24 LAB — CBC WITH DIFFERENTIAL/PLATELET
BASOS PCT: 0 % (ref 0–1)
Basophils Absolute: 0 10*3/uL (ref 0.0–0.1)
EOS ABS: 0 10*3/uL (ref 0.0–0.7)
Eosinophils Relative: 0 % (ref 0–5)
HEMATOCRIT: 31.1 % — AB (ref 36.0–46.0)
Hemoglobin: 10.3 g/dL — ABNORMAL LOW (ref 12.0–15.0)
LYMPHS PCT: 46 % (ref 12–46)
Lymphs Abs: 1.5 10*3/uL (ref 0.7–4.0)
MCH: 32.2 pg (ref 26.0–34.0)
MCHC: 33.1 g/dL (ref 30.0–36.0)
MCV: 97.2 fL (ref 78.0–100.0)
Monocytes Absolute: 0.4 10*3/uL (ref 0.1–1.0)
Monocytes Relative: 13 % — ABNORMAL HIGH (ref 3–12)
NEUTROS PCT: 41 % — AB (ref 43–77)
Neutro Abs: 1.4 10*3/uL — ABNORMAL LOW (ref 1.7–7.7)
Platelets: 234 10*3/uL (ref 150–400)
RBC: 3.2 MIL/uL — ABNORMAL LOW (ref 3.87–5.11)
RDW: 15.2 % (ref 11.5–15.5)
WBC: 3.3 10*3/uL — AB (ref 4.0–10.5)

## 2015-01-24 LAB — BASIC METABOLIC PANEL
Anion gap: 5 (ref 5–15)
BUN: 12 mg/dL (ref 6–23)
CO2: 28 mmol/L (ref 19–32)
Calcium: 11 mg/dL — ABNORMAL HIGH (ref 8.4–10.5)
Chloride: 106 mmol/L (ref 96–112)
Creatinine, Ser: 0.71 mg/dL (ref 0.50–1.10)
GFR calc Af Amer: >90 mL/min (ref >90)
GFR calc non Af Amer: >90 mL/min (ref >90)
GLUCOSE: 93 mg/dL (ref 70–99)
Potassium: 4.2 mmol/L (ref 3.5–5.1)
SODIUM: 139 mmol/L (ref 135–145)

## 2015-01-24 LAB — I-STAT TROPONIN, ED: TROPONIN I, POC: 0 ng/mL (ref 0.00–0.08)

## 2015-01-24 LAB — URINALYSIS, ROUTINE W REFLEX MICROSCOPIC
Bilirubin Urine: NEGATIVE
GLUCOSE, UA: NEGATIVE mg/dL
Hgb urine dipstick: NEGATIVE
Ketones, ur: NEGATIVE mg/dL
Nitrite: NEGATIVE
PROTEIN: 30 mg/dL — AB
Specific Gravity, Urine: 1.021 (ref 1.005–1.030)
Urobilinogen, UA: 1 mg/dL (ref 0.0–1.0)
pH: 6.5 (ref 5.0–8.0)

## 2015-01-24 LAB — URINE MICROSCOPIC-ADD ON

## 2015-01-24 MED ORDER — SODIUM CHLORIDE 0.9 % IV BOLUS (SEPSIS)
1000.0000 mL | Freq: Once | INTRAVENOUS | Status: AC
  Administered 2015-01-24: 1000 mL via INTRAVENOUS

## 2015-01-24 MED ORDER — IOHEXOL 350 MG/ML SOLN
100.0000 mL | Freq: Once | INTRAVENOUS | Status: AC | PRN
  Administered 2015-01-24: 100 mL via INTRAVENOUS

## 2015-01-24 MED ORDER — HEPARIN SOD (PORK) LOCK FLUSH 100 UNIT/ML IV SOLN
500.0000 [IU] | Freq: Once | INTRAVENOUS | Status: AC
  Administered 2015-01-24: 500 [IU]
  Filled 2015-01-24: qty 5

## 2015-01-24 NOTE — ED Provider Notes (Signed)
TIME SEEN: 10:20 AM  CHIEF COMPLAINT: Syncope, shortness of breath  HPI: Pt is a 44 y.o. female with history of left-sided breast cancer he finished chemotherapy on February 23 who presents to the emergency department with syncopal episode that occurred yesterday. States that she was receiving an MRI and was lying on the table. States she felt nauseous and uncomfortable so she stood up and felt lightheaded and then passed out. She states prior to that she did feel short of breath and has some shortness of breath today. No chest pain or chest discomfort. No headache, numbness, tingling or focal weakness. Denies history of hypertension, diabetes, hyperlipidemia or tobacco use. No prior history of PE or DVT. No lower extremity swelling or pain. No fevers or chills. No cough. No vomiting or diarrhea. States that her husband convinced her to come to the hospital today to be evaluated.  ROS: See HPI Constitutional: no fever  Eyes: no drainage  ENT: no runny nose   Cardiovascular:  no chest pain  Resp:  SOB  GI: no vomiting GU: no dysuria Integumentary: no rash  Allergy: no hives  Musculoskeletal: no leg swelling  Neurological: no slurred speech ROS otherwise negative  PAST MEDICAL HISTORY/PAST SURGICAL HISTORY:  Past Medical History  Diagnosis Date  . Cancer     left breast dcis  . Family history of breast cancer   . Breast cancer     left breast    MEDICATIONS:  Prior to Admission medications   Medication Sig Start Date End Date Taking? Authorizing Provider  acetaminophen (TYLENOL) 325 MG tablet Take 2 tablets (650 mg total) by mouth every 6 (six) hours as needed. 10/04/14   Rulon Eisenmenger, MD  dexamethasone (DECADRON) 4 MG tablet 2TABS BY MOUTH TWICE A DAY START THE DAY BEFORE TAXOTERE THEN AGAIN DAY AFTER CHEMO X3DAYS 01/16/15   Rulon Eisenmenger, MD  diphenoxylate-atropine (LOMOTIL) 2.5-0.025 MG per tablet Take 1 tablet by mouth 4 (four) times daily as needed for diarrhea or loose  stools. 11/08/14   Rulon Eisenmenger, MD  ibuprofen (ADVIL,MOTRIN) 100 MG chewable tablet Chew by mouth every 8 (eight) hours as needed.    Historical Provider, MD  lidocaine-prilocaine (EMLA) cream Apply to affected area once 09/28/14   Rulon Eisenmenger, MD  loperamide (IMODIUM) 2 MG capsule Take 1 capsule (2 mg total) by mouth as needed for diarrhea or loose stools. 10/11/14   Rulon Eisenmenger, MD  loratadine (CLARITIN) 10 MG tablet TAKE 1 TABLET (10 MG TOTAL) BY MOUTH DAILY. 12/05/14   Rulon Eisenmenger, MD  LORazepam (ATIVAN) 0.5 MG tablet TAKE 1 TABLET BY MOUTH EVERY 6 HOURS AS NEEDED FOR NAUSEA AND VOMITING 01/16/15   Rulon Eisenmenger, MD  ondansetron (ZOFRAN) 8 MG tablet Take 1 tablet (8 mg total) by mouth 2 (two) times daily. Start the day after chemo for 3 days. Then take as needed for nausea or vomiting. 09/28/14   Rulon Eisenmenger, MD  prochlorperazine (COMPAZINE) 10 MG tablet Take 1 tablet (10 mg total) by mouth every 6 (six) hours as needed (Nausea or vomiting). 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 Corn-Containing Products Hives    SOCIAL HISTORY:  History  Substance Use Topics  . Smoking status: Never Smoker   . Smokeless tobacco:  Not on file  . Alcohol Use: No    FAMILY HISTORY: Family History  Problem Relation Age of Onset  . Breast cancer Maternal Aunt 18    currently 13  . Other Mother     TAH/BSO at 86  . Colon cancer Maternal Uncle     deceased 62s  . Breast cancer Cousin     EXAM: BP 120/66 mmHg  Pulse 116  Temp(Src) 98 F (36.7 C) (Oral)  Resp 16  SpO2 98% CONSTITUTIONAL: Alert and oriented and responds appropriately to questions. Well-appearing; well-nourished HEAD: Normocephalic EYES: Conjunctivae clear, PERRL ENT: normal nose; no rhinorrhea; moist mucous membranes; pharynx without lesions  noted NECK: Supple, no meningismus, no LAD  CARD: Regular and tachycardic; S1 and S2 appreciated; no murmurs, no clicks, no rubs, no gallops RESP: Normal chest excursion without splinting or tachypnea; breath sounds clear and equal bilaterally; no wheezes, no rhonchi, no rales, no hypoxia or respiratory distress, speaking full sentences ABD/GI: Normal bowel sounds; non-distended; soft, non-tender, no rebound, no guarding BACK:  The back appears normal and is non-tender to palpation, there is no CVA tenderness EXT: Normal ROM in all joints; non-tender to palpation; no edema; normal capillary refill; no cyanosis, no calf tenderness    SKIN: Normal color for age and race; warm NEURO: Moves all extremities equally, sensation to light touch intact diffusely, cranial nerves II through XII intact PSYCH: The patient's mood and manner are appropriate. Grooming and personal hygiene are appropriate.  MEDICAL DECISION MAKING: Patient with syncopal episode after standing up yesterday. No risk factors for ACS other than age. EKG shows no ischemic changes, arrhythmia. No interval changes. She is however tachycardic and reports shortness of breath and has a history of breast cancer. Will obtain a CT of her chest to rule out pulmonary embolus. Suspect orthostatic hypotension. We'll check orthostatic vital signs. We'll check basic labs and give IV fluids. Patient denies any injury from the syncopal event. States she does not think she hit her head. Not on anticoagulation. If workup unremarkable anticipate discharge home with outpatient follow-up.  ED PROGRESS: Patient's labs are unremarkable. Hemogram and stable. Electrodes normal. Troponin negative. Urine shows small leukocytes but no other sign of infection. She has no urinary symptoms. CT scan shows no pulmonary embolus or acute cardiopulmonary process. Heart rate does elevate when she stands up and she does feel dizzy. Reports feeling better after IV fluids. Suspect  orthostatic hypotension was the cause of her syncope yesterday. I feel she is safe to be discharged home. I do not feel she needs admission. Discussed with her increasing her fluid intake, avoiding alcohol and caffeine. Discussed return precautions. She verbalized understanding and is comfortable with plan.      EKG Interpretation  Date/Time:  Tuesday January 24 2015 10:14:20 EST Ventricular Rate:  107 PR Interval:  134 QRS Duration: 84 QT Interval:  321 QTC Calculation: 428 R Axis:   2 Text Interpretation:  Sinus tachycardia Low voltage, precordial leads Baseline wander in lead(s) V2 No significant change since last tracing in 2010 Confirmed by WARD,  DO, KRISTEN [id number]) on 01/24/2015 10:18:40 AM         Delice Bison Ward, DO 01/24/15 1441

## 2015-01-24 NOTE — ED Notes (Signed)
Pt reports syncopal episode yesterday while having MRI. No hx of passing out. Nauseated and fatigued today.

## 2015-01-24 NOTE — Discharge Instructions (Signed)
Dehydration, Adult °Dehydration is when you lose more fluids from the body than you take in. Vital organs like the kidneys, brain, and heart cannot function without a proper amount of fluids and salt. Any loss of fluids from the body can cause dehydration.  °CAUSES  °· Vomiting. °· Diarrhea. °· Excessive sweating. °· Excessive urine output. °· Fever. °SYMPTOMS  °Mild dehydration °· Thirst. °· Dry lips. °· Slightly dry mouth. °Moderate dehydration °· Very dry mouth. °· Sunken eyes. °· Skin does not bounce back quickly when lightly pinched and released. °· Dark urine and decreased urine production. °· Decreased tear production. °· Headache. °Severe dehydration °· Very dry mouth. °· Extreme thirst. °· Rapid, weak pulse (more than 100 beats per minute at rest). °· Cold hands and feet. °· Not able to sweat in spite of heat and temperature. °· Rapid breathing. °· Blue lips. °· Confusion and lethargy. °· Difficulty being awakened. °· Minimal urine production. °· No tears. °DIAGNOSIS  °Your caregiver will diagnose dehydration based on your symptoms and your exam. Blood and urine tests will help confirm the diagnosis. The diagnostic evaluation should also identify the cause of dehydration. °TREATMENT  °Treatment of mild or moderate dehydration can often be done at home by increasing the amount of fluids that you drink. It is best to drink small amounts of fluid more often. Drinking too much at one time can make vomiting worse. Refer to the home care instructions below. °Severe dehydration needs to be treated at the hospital where you will probably be given intravenous (IV) fluids that contain water and electrolytes. °HOME CARE INSTRUCTIONS  °· Ask your caregiver about specific rehydration instructions. °· Drink enough fluids to keep your urine clear or pale yellow. °· Drink small amounts frequently if you have nausea and vomiting. °· Eat as you normally do. °· Avoid: °¨ Foods or drinks high in sugar. °¨ Carbonated  drinks. °¨ Juice. °¨ Extremely hot or cold fluids. °¨ Drinks with caffeine. °¨ Fatty, greasy foods. °¨ Alcohol. °¨ Tobacco. °¨ Overeating. °¨ Gelatin desserts. °· Wash your hands well to avoid spreading bacteria and viruses. °· Only take over-the-counter or prescription medicines for pain, discomfort, or fever as directed by your caregiver. °· Ask your caregiver if you should continue all prescribed and over-the-counter medicines. °· Keep all follow-up appointments with your caregiver. °SEEK MEDICAL CARE IF: °· You have abdominal pain and it increases or stays in one area (localizes). °· You have a rash, stiff neck, or severe headache. °· You are irritable, sleepy, or difficult to awaken. °· You are weak, dizzy, or extremely thirsty. °SEEK IMMEDIATE MEDICAL CARE IF:  °· You are unable to keep fluids down or you get worse despite treatment. °· You have frequent episodes of vomiting or diarrhea. °· You have blood or green matter (bile) in your vomit. °· You have blood in your stool or your stool looks black and tarry. °· You have not urinated in 6 to 8 hours, or you have only urinated a small amount of very dark urine. °· You have a fever. °· You faint. °MAKE SURE YOU:  °· Understand these instructions. °· Will watch your condition. °· Will get help right away if you are not doing well or get worse. °Document Released: 11/11/2005 Document Revised: 02/03/2012 Document Reviewed: 07/01/2011 °[employer]® Patient Information ©2015 [employer]. This information is not intended to replace advice given to you by your health care provider. Make sure you discuss any questions you have with your health care   provider.    Orthostatic Hypotension Orthostatic hypotension is a sudden drop in blood pressure. It happens when you quickly stand up from a seated or lying position. You may feel dizzy or light-headed. This can last for just a few seconds or for up to a few minutes. It is usually not a serious problem. However, if this  happens frequently or gets worse, it can be a sign of something more serious. CAUSES  Different things can cause orthostatic hypotension, including:   Loss of body fluids (dehydration).  Medicines that lower blood pressure.  Sudden changes in posture, such as standing up quickly after you have been sitting or lying down.  Taking too much of your medicine. SIGNS AND SYMPTOMS   Light-headedness or dizziness.   Fainting or near-fainting.   A fast heart rate.   Weakness.   Feeling tired (fatigue).  DIAGNOSIS  Your health care provider may do several things to help diagnose your condition and identify the cause. These may include:   Taking a medical history and doing a physical exam.  Checking your blood pressure. Your health care provider will check your blood pressure when you are:  Lying down.  Sitting.  Standing.  Using tilt table testing. In this test, you lie down on a table that moves from a lying position to a standing position. You will be strapped onto the table. This test monitors your blood pressure and heart rate when you are in different positions. TREATMENT  Treatment will vary depending on the cause. Possible treatments include:   Changing the dosage of your medicines.  Wearing compression stockings on your lower legs.  Standing up slowly after sitting or lying down.  Eating more salt.  Eating frequent, small meals.  In some cases, getting IV fluids.  Taking medicine to enhance fluid retention. HOME CARE INSTRUCTIONS  Only take over-the-counter or prescription medicines as directed by your health care provider.  Follow your health care provider's instructions for changing the dosage of your current medicines.  Do not stop or adjust your medicine on your own.  Stand up slowly after sitting or lying down. This allows your body to adjust to the different position.  Wear compression stockings as directed.  Eat extra salt as directed.  Do  not add extra salt to your diet unless directed to by your health care provider.  Eat frequent, small meals.  Avoid standing suddenly after eating.  Avoid hot showers or excessive heat as directed by your health care provider.  Keep all follow-up appointments. SEEK MEDICAL CARE IF:  You continue to feel dizzy or light-headed after standing.  You feel groggy or confused.  You feel cold, clammy, or sick to your stomach (nauseous).  You have blurred vision.  You feel short of breath. SEEK IMMEDIATE MEDICAL CARE IF:   You faint after standing.  You have chest pain.  You have difficulty breathing.   You lose feeling or movement in your arms or legs.   You have slurred speech or difficulty talking, or you are unable to talk.  MAKE SURE YOU:   Understand these instructions.  Will watch your condition.  Will get help right away if you are not doing well or get worse. Document Released: 11/01/2002 Document Revised: 11/16/2013 Document Reviewed: 09/03/2013 [facility] Patient Information 2015 [place], [place]. This information is not intended to replace advice given to you by your health care provider. Make sure you discuss any questions you have with your health care provider.  Syncope Syncope  is a medical term for fainting or passing out. This means you lose consciousness and drop to the ground. People are generally unconscious for less than 5 minutes. You may have some muscle twitches for up to 15 seconds before waking up and returning to normal. Syncope occurs more often in older adults, but it can happen to anyone. While most causes of syncope are not dangerous, syncope can be a sign of a serious medical problem. It is important to seek medical care.  CAUSES  Syncope is caused by a sudden drop in blood flow to the brain. The specific cause is often not determined. Factors that can bring on syncope include:  Taking medicines that lower blood pressure.  Sudden changes in  posture, such as standing up quickly.  Taking more medicine than prescribed.  Standing in one place for too long.  Seizure disorders.  Dehydration and excessive exposure to heat.  Low blood sugar (hypoglycemia).  Straining to have a bowel movement.  Heart disease, irregular heartbeat, or other circulatory problems.  Fear, emotional distress, seeing blood, or severe pain. SYMPTOMS  Right before fainting, you may:  Feel dizzy or light-headed.  Feel nauseous.  See all white or all black in your field of vision.  Have cold, clammy skin. DIAGNOSIS  Your health care provider will ask about your symptoms, perform a physical exam, and perform an electrocardiogram (ECG) to record the electrical activity of your heart. Your health care provider may also perform other heart or blood tests to determine the cause of your syncope which may include:  Transthoracic echocardiogram (TTE). During echocardiography, sound waves are used to evaluate how blood flows through your heart.  Transesophageal echocardiogram (TEE).  Cardiac monitoring. This allows your health care provider to monitor your heart rate and rhythm in real time.  Holter monitor. This is a portable device that records your heartbeat and can help diagnose heart arrhythmias. It allows your health care provider to track your heart activity for several days, if needed.  Stress tests by exercise or by giving medicine that makes the heart beat faster. TREATMENT  In most cases, no treatment is needed. Depending on the cause of your syncope, your health care provider may recommend changing or stopping some of your medicines. HOME CARE INSTRUCTIONS  Have someone stay with you until you feel stable.  Do not drive, use machinery, or play sports until your health care provider says it is okay.  Keep all follow-up appointments as directed by your health care provider.  Lie down right away if you start feeling like you might faint.  Breathe deeply and steadily. Wait until all the symptoms have passed.  Drink enough fluids to keep your urine clear or pale yellow.  If you are taking blood pressure or heart medicine, get up slowly and take several minutes to sit and then stand. This can reduce dizziness. SEEK IMMEDIATE MEDICAL CARE IF:   You have a severe headache.  You have unusual pain in the chest, abdomen, or back.  You are bleeding from your mouth or rectum, or you have black or tarry stool.  You have an irregular or very fast heartbeat.  You have pain with breathing.  You have repeated fainting or seizure-like jerking during an episode.  You faint when sitting or lying down.  You have confusion.  You have trouble walking.  You have severe weakness.  You have vision problems. If you fainted, call your local emergency services (911 in [place] Do not drive yourself  to the hospital.  MAKE SURE YOU:  Understand these instructions.  Will watch your condition.  Will get help right away if you are not doing well or get worse. Document Released: 11/11/2005 Document Revised: 11/16/2013 Document Reviewed: 01/10/2012 [facility] Patient Information 2015 [place], [place]. This information is not intended to replace advice given to you by your health care provider. Make sure you discuss any questions you have with your health care provider.

## 2015-01-25 ENCOUNTER — Telehealth: Payer: Self-pay

## 2015-01-25 ENCOUNTER — Telehealth: Payer: Self-pay | Admitting: Nurse Practitioner

## 2015-01-25 NOTE — Telephone Encounter (Signed)
Per 03/02 POF labs/ov for 03/03 with NP/CB, pt is aware.... Aimee Poole

## 2015-01-25 NOTE — Telephone Encounter (Signed)
LMOVM - pt to return call to clinic.  Tell Triage nurse to pass call through to desk nurse.

## 2015-01-25 NOTE — Telephone Encounter (Signed)
Call report rcvd from [place] 01/24/15 at 8:30 am.  Per Dr. Lindi Adie, bring patient in to be seen by [facility] and IVF if needed.  Pt can come in at 49.  Per Jenny Reichmann, she can see patient.  pof entered, lab orders entered.

## 2015-01-26 ENCOUNTER — Ambulatory Visit (HOSPITAL_BASED_OUTPATIENT_CLINIC_OR_DEPARTMENT_OTHER): Payer: BLUE CROSS/BLUE SHIELD | Admitting: Nurse Practitioner

## 2015-01-26 ENCOUNTER — Other Ambulatory Visit: Payer: BLUE CROSS/BLUE SHIELD

## 2015-01-26 DIAGNOSIS — R197 Diarrhea, unspecified: Secondary | ICD-10-CM

## 2015-01-26 DIAGNOSIS — E860 Dehydration: Secondary | ICD-10-CM | POA: Diagnosis not present

## 2015-01-26 DIAGNOSIS — C50412 Malignant neoplasm of upper-outer quadrant of left female breast: Secondary | ICD-10-CM | POA: Diagnosis not present

## 2015-01-26 DIAGNOSIS — I951 Orthostatic hypotension: Secondary | ICD-10-CM | POA: Diagnosis not present

## 2015-01-26 DIAGNOSIS — Z95828 Presence of other vascular implants and grafts: Secondary | ICD-10-CM

## 2015-01-26 MED ORDER — SODIUM CHLORIDE 0.9 % IJ SOLN
10.0000 mL | INTRAMUSCULAR | Status: DC | PRN
  Administered 2015-01-26: 10 mL via INTRAVENOUS
  Filled 2015-01-26: qty 10

## 2015-01-26 MED ORDER — SODIUM CHLORIDE 0.9 % IV SOLN
1000.0000 mL | Freq: Once | INTRAVENOUS | Status: AC
  Administered 2015-01-26: 1000 mL via INTRAVENOUS

## 2015-01-26 MED ORDER — HEPARIN SOD (PORK) LOCK FLUSH 100 UNIT/ML IV SOLN
500.0000 [IU] | Freq: Once | INTRAVENOUS | Status: AC
  Administered 2015-01-26: 500 [IU] via INTRAVENOUS
  Filled 2015-01-26: qty 5

## 2015-01-27 ENCOUNTER — Encounter: Payer: Self-pay | Admitting: Nurse Practitioner

## 2015-01-27 ENCOUNTER — Telehealth: Payer: Self-pay

## 2015-01-27 DIAGNOSIS — I951 Orthostatic hypotension: Secondary | ICD-10-CM | POA: Insufficient documentation

## 2015-01-27 NOTE — Progress Notes (Signed)
SYMPTOM MANAGEMENT CLINIC   HPI: Aimee Poole 44 y.o. female diagnosed with breast cancer.  Patient is status post previous lumpectomy; and Taxotere/carboplatin/Herceptin/Perjeta chemotherapy regimen completed on 3/23 2016.  Will initiate Herceptin only infusions on an every three-week basis on 02/07/2015.  Patient called the cancer Center with request an urgent care visit.  Patient reports that she had a full syncopal episode while undergoing an MRI earlier this week.  She denies hitting her head or obtaining any injuries with the syncopal episode.  She is also complaining of some occasional dizziness when she changes position suddenly.  She has also been having some chronic episodes of diarrhea for the past few days.  Patient states she has taken some occasional Imodium and Lomotil; but has not been taking it consistently; stating that she is worried she will become constipated.  She denies any nausea or vomiting.  She denies fever or chills.  Hypersensitivity Reaction  Urinary Tract Infection     ROS  Past Medical History  Diagnosis Date  . Cancer     left breast dcis  . Family history of breast cancer   . Breast cancer     left breast    Past Surgical History  Procedure Laterality Date  . Mastectomy Left 2010  . Breast reconstruction Left 2011  . Breast reduction surgery Right 2011  . Ectopic pregnancy surgery    . Cystectomy      ovary as well as scar tissue  . Ultrasound of left breast  09/09/2014    Hypoechoic mass  . Initial biopsy  09/12/2014    Invasive ductal ca  . Fibroid tumor removed      unknown date  . Cesarean section  2008    x 1    has History of breast cancer in female; Breast cancer of upper-outer quadrant of left female breast; Family history of breast cancer; Dehydration; Central line complication; Diarrhea; and Orthostatic hypotension on her problem list.    is allergic to corn-containing products.    Medication List       This list is  accurate as of: 01/26/15 11:59 PM.  Always use your most recent med list.               acetaminophen 325 MG tablet  Commonly known as:  TYLENOL  Take 2 tablets (650 mg total) by mouth every 6 (six) hours as needed.     dexamethasone 4 MG tablet  Commonly known as:  DECADRON  2TABS BY MOUTH TWICE A DAY START THE DAY BEFORE TAXOTERE THEN AGAIN DAY AFTER CHEMO X3DAYS     diphenoxylate-atropine 2.5-0.025 MG per tablet  Commonly known as:  LOMOTIL  Take 1 tablet by mouth 4 (four) times daily as needed for diarrhea or loose stools.     ibuprofen 100 MG chewable tablet  Commonly known as:  ADVIL,MOTRIN  Chew by mouth every 8 (eight) hours as needed.     lidocaine-prilocaine cream  Commonly known as:  EMLA  Apply to affected area once     loperamide 2 MG capsule  Commonly known as:  IMODIUM  Take 1 capsule (2 mg total) by mouth as needed for diarrhea or loose stools.     loratadine 10 MG tablet  Commonly known as:  CLARITIN  TAKE 1 TABLET (10 MG TOTAL) BY MOUTH DAILY.     LORazepam 0.5 MG tablet  Commonly known as:  ATIVAN  TAKE 1 TABLET BY MOUTH EVERY 6 HOURS AS NEEDED  FOR NAUSEA AND VOMITING     ondansetron 8 MG tablet  Commonly known as:  ZOFRAN  Take 1 tablet (8 mg total) by mouth 2 (two) times daily. Start the day after chemo for 3 days. Then take as needed for nausea or vomiting.     PRESCRIPTION MEDICATION  Chemo at [facility]     prochlorperazine 10 MG tablet  Commonly known as:  COMPAZINE  Take 1 tablet (10 mg total) by mouth every 6 (six) hours as needed (Nausea or vomiting).     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         PHYSICAL EXAMINATION  Oncology Vitals 01/24/2015 01/24/2015 01/24/2015 01/17/2015 12/27/2014 12/07/2014 12/06/2014  Height - - - 155 cm 155 cm - -  Weight - - - 81.829 kg 82.464 kg - 82.056 kg  Weight (lbs) - - - 180 lbs 6 oz 181 lbs 13 oz - 180 lbs 14 oz  BMI (kg/m2) - - - 34.09  kg/m2 34.35 kg/m2 - -  Temp - - 98 98.4 98.6 98.2 99.3  Pulse 87 92 116 75 85 83 90  Resp _0 - -  SpO2 98 100 98 - 100 - -  BSA (m2) - - - 1.88 m2 1.88 m2 - -   BP Readings from Last 3 Encounters:  01/24/15 115/73  01/17/15 152/83  12/27/14 133/71    Physical Exam  Constitutional: She is oriented to person, place, and time and well-developed, well-nourished, and in no distress.  HENT:  Head: Normocephalic and atraumatic.  Mouth/Throat: Oropharynx is clear and moist.  Eyes: Conjunctivae and EOM are normal. Pupils are equal, round, and reactive to light. Right eye exhibits no discharge. Left eye exhibits no discharge. No scleral icterus.  Neck: Normal range of motion. Neck supple. No JVD present. No tracheal deviation present. No thyromegaly present.  Cardiovascular: Normal rate, regular rhythm, normal heart sounds and intact distal pulses.   Pulmonary/Chest: Effort normal and breath sounds normal. No respiratory distress. She has no wheezes. She has no rales. She exhibits no tenderness.  Abdominal: Soft. Bowel sounds are normal. She exhibits no distension and no mass. There is no tenderness. There is no rebound and no guarding.  Musculoskeletal: Normal range of motion. She exhibits no edema or tenderness.  Lymphadenopathy:    She has no cervical adenopathy.  Neurological: She is alert and oriented to person, place, and time. Gait normal.  Skin: Skin is warm and dry. No rash noted. No erythema. No pallor.  Psychiatric: Affect normal.  Nursing note and vitals reviewed.   LABORATORY DATA:. Admission on 01/24/2015, Discharged on 01/24/2015  Component Date Value Ref Range Status  . WBC 01/24/2015 3.3* 4.0 - 10.5 K/uL Final  . RBC 01/24/2015 3.20* 3.87 - 5.11 MIL/uL Final  . Hemoglobin 01/24/2015 10.3* 12.0 - 15.0 g/dL Final  . HCT 01/24/2015 31.1* 36.0 - 46.0 % Final  . MCV 01/24/2015 97.2  78.0 - 100.0 fL Final  . MCH 01/24/2015 32.2  26.0 - 34.0 pg Final  . MCHC  01/24/2015 33.1  30.0 - 36.0 g/dL Final  . RDW 01/24/2015 15.2  11.5 - 15.5 % Final  . Platelets 01/24/2015 234  150 - 400 K/uL Final  . Neutrophils Relative % 01/24/2015 41* 43 - 77 % Final  . Lymphocytes Relative 01/24/2015 46  12 - 46 % Final  . Monocytes Relative 01/24/2015 13* 3 - 12 %  Final  . Eosinophils Relative 01/24/2015 0  0 - 5 % Final  . Basophils Relative 01/24/2015 0  0 - 1 % Final  . Neutro Abs 01/24/2015 1.4* 1.7 - 7.7 K/uL Final  . Lymphs Abs 01/24/2015 1.5  0.7 - 4.0 K/uL Final  . Monocytes Absolute 01/24/2015 0.4  0.1 - 1.0 K/uL Final  . Eosinophils Absolute 01/24/2015 0.0  0.0 - 0.7 K/uL Final  . Basophils Absolute 01/24/2015 0.0  0.0 - 0.1 K/uL Final  . WBC Morphology 01/24/2015 DOHLE BODIES   Final   VACUOLATED NEUTROPHILS  . Sodium 01/24/2015 139  135 - 145 mmol/L Final  . Potassium 01/24/2015 4.2  3.5 - 5.1 mmol/L Final  . Chloride 01/24/2015 106  96 - 112 mmol/L Final  . CO2 01/24/2015 28  19 - 32 mmol/L Final  . Glucose, Bld 01/24/2015 93  70 - 99 mg/dL Final  . BUN 01/24/2015 12  6 - 23 mg/dL Final  . Creatinine, Ser 01/24/2015 0.71  0.50 - 1.10 mg/dL Final  . Calcium 01/24/2015 11.0* 8.4 - 10.5 mg/dL Final  . GFR calc non Af Amer 01/24/2015 >90  >90 mL/min Final  . GFR calc Af Amer 01/24/2015 >90  >90 mL/min Final   Comment: (NOTE) The eGFR has been calculated using the CKD EPI equation. This calculation has not been validated in all clinical situations. eGFR's persistently <90 mL/min signify possible Chronic Kidney Disease.   . Anion gap 01/24/2015 5  5 - 15 Final  . Troponin i, poc 01/24/2015 0.00  0.00 - 0.08 ng/mL Final  . Comment 3 01/24/2015          Final   Comment: Due to the release kinetics of cTnI, a negative result within the first hours of the onset of symptoms does not rule out myocardial infarction with certainty. If myocardial infarction is still suspected, repeat the test at appropriate intervals.   . Color, Urine 01/24/2015  AMBER* YELLOW Final   BIOCHEMICALS MAY BE AFFECTED BY COLOR  . APPearance 01/24/2015 CLOUDY* CLEAR Final  . Specific Gravity, Urine 01/24/2015 1.021  1.005 - 1.030 Final  . pH 01/24/2015 6.5  5.0 - 8.0 Final  . Glucose, UA 01/24/2015 NEGATIVE  NEGATIVE mg/dL Final  . Hgb urine dipstick 01/24/2015 NEGATIVE  NEGATIVE Final  . Bilirubin Urine 01/24/2015 NEGATIVE  NEGATIVE Final  . Ketones, ur 01/24/2015 NEGATIVE  NEGATIVE mg/dL Final  . Protein, ur 01/24/2015 30* NEGATIVE mg/dL Final  . Urobilinogen, UA 01/24/2015 1.0  0.0 - 1.0 mg/dL Final  . Nitrite 01/24/2015 NEGATIVE  NEGATIVE Final  . Leukocytes, UA 01/24/2015 SMALL* NEGATIVE Final  . Squamous Epithelial / LPF 01/24/2015 RARE  RARE Final  . WBC, UA 01/24/2015 0-2  <3 WBC/hpf Final  . RBC / HPF 01/24/2015 0-2  <3 RBC/hpf Final  . Bacteria, UA 01/24/2015 RARE  RARE Final  . Urine-Other 01/24/2015 MUCOUS PRESENT   Final     RADIOGRAPHIC STUDIES: Ct Angio Chest Pe W/cm &/or Wo Cm  01/24/2015   CLINICAL DATA:  Breast cancer. Short of breath. Syncope. Pulmonary embolism. Mastectomy. Chemotherapy 01/17/2015.  EXAM: CT ANGIOGRAPHY CHEST WITH CONTRAST  TECHNIQUE: Multidetector CT imaging of the chest was performed using the standard protocol during bolus administration of intravenous contrast. Multiplanar CT image reconstructions and MIPs were obtained to evaluate the vascular anatomy.  CONTRAST:  148m OMNIPAQUE IOHEXOL 350 MG/ML SOLN  COMPARISON:  PET-CT 09/29/2014.  FINDINGS: Bones: Negative for compression fracture. No aggressive osseous lesions.  Cardiovascular: Technically  adequate study. Negative for pulmonary embolism. Three vessel aortic arch appears normal. RIGHT subclavian Port-A-Cath.  Lungs: Dependent atelectasis in the RIGHT lung. Dependent atelectasis in the LEFT lung. Subsegmental scarring in the anterior LEFT lower lobe that appears similar to the prior PET-CT.  Central airways: Patent.  Effusions: None.  Lymphadenopathy: No axillary  adenopathy. Postoperative changes of LEFT breast reconstruction. No mediastinal adenopathy. Scarring extends to the LEFT axilla.  Esophagus: Normal.  Upper abdomen: Normal. Accessory splenic tissue near the splenic hilum.  Other: None.  Review of the MIP images confirms the above findings.  IMPRESSION: Negative for pulmonary embolism or acute cardiopulmonary disease. Dependent atelectasis and LEFT lower lobe scarring.   Electronically Signed   By: Dereck Ligas M.D.   On: 01/24/2015 13:47    ASSESSMENT/PLAN:    Breast cancer of upper-outer quadrant of left female breast Patient is status post Taxotere/carboplatin/Herceptin/Perjeta neoadjuvant chemotherapy completed on 01/17/2015.  She is scheduled for a left breast lumpectomy with radioactive seed implantation per Dr. Donne Hazel on 02/09/2015.  She is also scheduled for a breast MRI on 01/30/2015.  She is scheduled for an echo on 02/01/2015.  She will return to the [facility] on 02/07/2015 for labs, visit, and to initiate the Herceptin only infusions.   Dehydration  Patient has been experiencing several days of diarrhea intermittently; and feels dehydrated today.  Patient will receive 1 L normal saline IV fluid rehydration today.  She was also encouraged to push fluids on a regular basis at home.   Diarrhea Patient states she has had several episodes of diarrhea within the past few days.  She has only taken intermittent Imodium and/or Lomotil; stating that she is afraid she may become constipated.  Patient has history of some chronic hemorrhoids; and reports that the chronic diarrhea has exacerbated her hemorrhoids.  She has noted some drops of bright red blood when she wipes on occasion.  Patient does feel slightly dehydrated today.  Patient will receive IV fluid rehydration while at the cancer Center today.  Also, patient was advised to alternate both Imodium and Lomotil on a regular basis to prevent any worsening issues with diarrhea.  She  may also try a clear liquid diet until she is able to tolerate a bland diet.  She was advised to call/return if she develops any worsening symptoms whatsoever.  She may very well need additional IV fluid rehydration neck week as well.    Orthostatic hypotension Patient reports a syncopal episode while undergoing an MRI earlier this past week.  She did not hit her head or experiencing any other injuries.  She continues to report some occasional dizziness when she changes position suddenly.  Sitting blood pressure was 126/60 today; and standing blood pressure was 115/68.  Patient has been suffering with some chronic diarrhea recently; and feels dehydrated today.  Confirmed patient does not take any blood pressure medications whatsoever.  Most likely, patient's mild orthostatic hypotension is secondary to dehydration.  Patient will receive 1 L normal saline IV fluid rehydration today.  Advised patient to push fluids at home.  Also advised patient to change positions slowly.  We'll continue to monitor closely.   Patient stated understanding of all instructions; and was in agreement with this plan of care. The patient knows to call the clinic with any problems, questions or concerns.   Review/collaboration with Dr. Lindi Adie regarding all aspects of patient's visit today.   Total time spent with patient was 25 minutes;  with greater than 75 percent  of that time spent in face to face counseling regarding patient's symptoms,  and coordination of care and follow up.  Disclaimer: This note was dictated with voice recognition software. Similar sounding words can inadvertently be transcribed and may not be corrected upon review.   Drue Second, NP 01/27/2015

## 2015-01-27 NOTE — Assessment & Plan Note (Signed)
Patient is status post Taxotere/carboplatin/Herceptin/Perjeta neoadjuvant chemotherapy completed on 01/17/2015.  She is scheduled for a left breast lumpectomy with radioactive seed implantation per Dr. Donne Hazel on 02/09/2015.  She is also scheduled for a breast MRI on 01/30/2015.  She is scheduled for an echo on 02/01/2015.  She will return to the [facility] on 02/07/2015 for labs, visit, and to initiate the Herceptin only infusions.

## 2015-01-27 NOTE — Telephone Encounter (Signed)
Gave pt d/t for echo appt on 3/9 at 9 am, Aimee Poole.  Pt reports she has an appt with [facility] Dr. Donne Hazel that day.  She will check to ensure there is no conflict and call clinic if necessary.

## 2015-01-27 NOTE — Assessment & Plan Note (Signed)
Patient reports a syncopal episode while undergoing an MRI earlier this past week.  She did not hit her head or experiencing any other injuries.  She continues to report some occasional dizziness when she changes position suddenly.  Sitting blood pressure was 126/60 today; and standing blood pressure was 115/68.  Patient has been suffering with some chronic diarrhea recently; and feels dehydrated today.  Confirmed patient does not take any blood pressure medications whatsoever.  Most likely, patient's mild orthostatic hypotension is secondary to dehydration.  Patient will receive 1 L normal saline IV fluid rehydration today.  Advised patient to push fluids at home.  Also advised patient to change positions slowly.  We'll continue to monitor closely.

## 2015-01-27 NOTE — Assessment & Plan Note (Addendum)
  Patient has been experiencing several days of diarrhea intermittently; and feels dehydrated today.  Patient will receive 1 L normal saline IV fluid rehydration today.  She was also encouraged to push fluids on a regular basis at home.

## 2015-01-27 NOTE — Assessment & Plan Note (Signed)
Patient states she has had several episodes of diarrhea within the past few days.  She has only taken intermittent Imodium and/or Lomotil; stating that she is afraid she may become constipated.  Patient has history of some chronic hemorrhoids; and reports that the chronic diarrhea has exacerbated her hemorrhoids.  She has noted some drops of bright red blood when she wipes on occasion.  Patient does feel slightly dehydrated today.  Patient will receive IV fluid rehydration while at the cancer Center today.  Also, patient was advised to alternate both Imodium and Lomotil on a regular basis to prevent any worsening issues with diarrhea.  She may also try a clear liquid diet until she is able to tolerate a bland diet.  She was advised to call/return if she develops any worsening symptoms whatsoever.  She may very well need additional IV fluid rehydration neck week as well.

## 2015-01-30 ENCOUNTER — Ambulatory Visit
Admission: RE | Admit: 2015-01-30 | Discharge: 2015-01-30 | Disposition: A | Discharge status: Home / Self Care | Payer: BLUE CROSS/BLUE SHIELD | Source: Ambulatory Visit | Attending: General Surgery | Admitting: General Surgery

## 2015-01-30 MED ORDER — GADOBENATE DIMEGLUMINE 529 MG/ML IV SOLN
16.0000 mL | Freq: Once | INTRAVENOUS | Status: AC | PRN
  Administered 2015-01-30: 16 mL via INTRAVENOUS

## 2015-02-01 ENCOUNTER — Encounter: Payer: Self-pay | Admitting: Genetic Counselor

## 2015-02-01 ENCOUNTER — Ambulatory Visit (HOSPITAL_COMMUNITY)
Admission: RE | Admit: 2015-02-01 | Discharge: 2015-02-01 | Disposition: A | Discharge status: Home / Self Care | Payer: BLUE CROSS/BLUE SHIELD | Source: Ambulatory Visit | Attending: Hematology and Oncology | Admitting: Hematology and Oncology

## 2015-02-01 DIAGNOSIS — Z79899 Other long term (current) drug therapy: Secondary | ICD-10-CM | POA: Diagnosis not present

## 2015-02-01 DIAGNOSIS — C50412 Malignant neoplasm of upper-outer quadrant of left female breast: Secondary | ICD-10-CM | POA: Diagnosis not present

## 2015-02-01 DIAGNOSIS — Z1379 Encounter for other screening for genetic and chromosomal anomalies: Secondary | ICD-10-CM | POA: Insufficient documentation

## 2015-02-01 DIAGNOSIS — Z5111 Encounter for antineoplastic chemotherapy: Secondary | ICD-10-CM

## 2015-02-01 NOTE — Progress Notes (Signed)
  Echocardiogram 2D Echocardiogram has been performed.  Donata Clay 02/01/2015, 12:00 PM

## 2015-02-02 ENCOUNTER — Encounter (HOSPITAL_BASED_OUTPATIENT_CLINIC_OR_DEPARTMENT_OTHER): Payer: Self-pay | Admitting: *Deleted

## 2015-02-02 ENCOUNTER — Telehealth: Payer: Self-pay | Admitting: *Deleted

## 2015-02-02 DIAGNOSIS — R059 Cough, unspecified: Secondary | ICD-10-CM

## 2015-02-02 HISTORY — DX: Cough, unspecified: R05.9

## 2015-02-02 NOTE — Telephone Encounter (Signed)
Pt left a message stating she had her echo and she wanted to make sure Dr Lindi Adie saw results. (States heart rate was 132?)

## 2015-02-03 ENCOUNTER — Encounter: Payer: Self-pay | Admitting: Hematology and Oncology

## 2015-02-03 NOTE — Progress Notes (Signed)
I faxed notes,etc to [employer] [phone] for the patient.

## 2015-02-03 NOTE — Progress Notes (Signed)
Called disability line...[id number] [phone] and spoke with Magda Paganini. They needed my fax for some forms.

## 2015-02-07 ENCOUNTER — Other Ambulatory Visit: Payer: Self-pay | Admitting: General Surgery

## 2015-02-07 ENCOUNTER — Other Ambulatory Visit: Payer: Self-pay | Admitting: *Deleted

## 2015-02-07 ENCOUNTER — Telehealth: Payer: Self-pay | Admitting: Hematology and Oncology

## 2015-02-07 ENCOUNTER — Other Ambulatory Visit (HOSPITAL_BASED_OUTPATIENT_CLINIC_OR_DEPARTMENT_OTHER): Payer: BLUE CROSS/BLUE SHIELD

## 2015-02-07 ENCOUNTER — Ambulatory Visit
Admission: RE | Admit: 2015-02-07 | Discharge: 2015-02-07 | Disposition: A | Discharge status: Home / Self Care | Payer: BLUE CROSS/BLUE SHIELD | Source: Ambulatory Visit | Attending: General Surgery | Admitting: General Surgery

## 2015-02-07 ENCOUNTER — Ambulatory Visit (HOSPITAL_BASED_OUTPATIENT_CLINIC_OR_DEPARTMENT_OTHER): Payer: BLUE CROSS/BLUE SHIELD | Admitting: Hematology and Oncology

## 2015-02-07 ENCOUNTER — Ambulatory Visit (HOSPITAL_BASED_OUTPATIENT_CLINIC_OR_DEPARTMENT_OTHER): Payer: BLUE CROSS/BLUE SHIELD

## 2015-02-07 VITALS — BP 103/58 | HR 99 | Temp 98.6°F | Resp 18

## 2015-02-07 DIAGNOSIS — Z5112 Encounter for antineoplastic immunotherapy: Secondary | ICD-10-CM

## 2015-02-07 DIAGNOSIS — C50412 Malignant neoplasm of upper-outer quadrant of left female breast: Secondary | ICD-10-CM

## 2015-02-07 DIAGNOSIS — Z170 Estrogen receptor positive status [ER+]: Secondary | ICD-10-CM

## 2015-02-07 DIAGNOSIS — C50912 Malignant neoplasm of unspecified site of left female breast: Secondary | ICD-10-CM

## 2015-02-07 DIAGNOSIS — Z853 Personal history of malignant neoplasm of breast: Secondary | ICD-10-CM

## 2015-02-07 LAB — COMPREHENSIVE METABOLIC PANEL (CC13)
ALBUMIN: 3.4 g/dL — AB (ref 3.5–5.0)
ALT: 18 U/L (ref 0–55)
ANION GAP: 11 meq/L (ref 3–11)
AST: 26 U/L (ref 5–34)
Alkaline Phosphatase: 66 U/L (ref 40–150)
BUN: 8.4 mg/dL (ref 7.0–26.0)
CHLORIDE: 107 meq/L (ref 98–109)
CO2: 23 mEq/L (ref 22–29)
Calcium: 10.1 mg/dL (ref 8.4–10.4)
Creatinine: 0.8 mg/dL (ref 0.6–1.1)
GLUCOSE: 106 mg/dL (ref 70–140)
POTASSIUM: 3.8 meq/L (ref 3.5–5.1)
SODIUM: 141 meq/L (ref 136–145)
TOTAL PROTEIN: 7 g/dL (ref 6.4–8.3)
Total Bilirubin: 0.33 mg/dL (ref 0.20–1.20)

## 2015-02-07 LAB — CBC WITH DIFFERENTIAL/PLATELET
BASO%: 0.5 % (ref 0.0–2.0)
BASOS ABS: 0 10*3/uL (ref 0.0–0.1)
EOS%: 0.2 % (ref 0.0–7.0)
Eosinophils Absolute: 0 10*3/uL (ref 0.0–0.5)
HCT: 32.8 % — ABNORMAL LOW (ref 34.8–46.6)
HGB: 10.9 g/dL — ABNORMAL LOW (ref 11.6–15.9)
LYMPH#: 1.7 10*3/uL (ref 0.9–3.3)
LYMPH%: 41.1 % (ref 14.0–49.7)
MCH: 31.7 pg (ref 25.1–34.0)
MCHC: 33.2 g/dL (ref 31.5–36.0)
MCV: 95.3 fL (ref 79.5–101.0)
MONO#: 0.3 10*3/uL (ref 0.1–0.9)
MONO%: 7.6 % (ref 0.0–14.0)
NEUT#: 2.1 10*3/uL (ref 1.5–6.5)
NEUT%: 50.6 % (ref 38.4–76.8)
Platelets: 107 10*3/uL — ABNORMAL LOW (ref 145–400)
RBC: 3.44 10*6/uL — ABNORMAL LOW (ref 3.70–5.45)
RDW: 15.5 % — ABNORMAL HIGH (ref 11.2–14.5)
WBC: 4.1 10*3/uL (ref 3.9–10.3)
nRBC: 0 % (ref 0–0)

## 2015-02-07 MED ORDER — DIPHENHYDRAMINE HCL 25 MG PO CAPS
ORAL_CAPSULE | ORAL | Status: AC
  Filled 2015-02-07: qty 2

## 2015-02-07 MED ORDER — ACETAMINOPHEN 325 MG PO TABS
ORAL_TABLET | ORAL | Status: AC
  Filled 2015-02-07: qty 2

## 2015-02-07 MED ORDER — TRASTUZUMAB CHEMO INJECTION 440 MG
6.0000 mg/kg | Freq: Once | INTRAVENOUS | Status: AC
  Administered 2015-02-07: 462 mg via INTRAVENOUS
  Filled 2015-02-07: qty 22

## 2015-02-07 MED ORDER — SODIUM CHLORIDE 0.9 % IV SOLN
Freq: Once | INTRAVENOUS | Status: AC
  Administered 2015-02-07: 15:00:00 via INTRAVENOUS

## 2015-02-07 MED ORDER — HEPARIN SOD (PORK) LOCK FLUSH 100 UNIT/ML IV SOLN
500.0000 [IU] | Freq: Once | INTRAVENOUS | Status: AC | PRN
  Administered 2015-02-07: 500 [IU]
  Filled 2015-02-07: qty 5

## 2015-02-07 MED ORDER — LIDOCAINE-PRILOCAINE 2.5-2.5 % EX CREA: 1.0000 "application " | TOPICAL_CREAM | CUTANEOUS | Status: DC | PRN

## 2015-02-07 MED ORDER — SODIUM CHLORIDE 0.9 % IJ SOLN
10.0000 mL | INTRAMUSCULAR | Status: DC | PRN
  Administered 2015-02-07: 10 mL
  Filled 2015-02-07: qty 10

## 2015-02-07 MED ORDER — ACETAMINOPHEN 325 MG PO TABS
650.0000 mg | ORAL_TABLET | Freq: Once | ORAL | Status: AC
  Administered 2015-02-07: 650 mg via ORAL

## 2015-02-07 MED ORDER — DIPHENHYDRAMINE HCL 25 MG PO CAPS
50.0000 mg | ORAL_CAPSULE | Freq: Once | ORAL | Status: AC
  Administered 2015-02-07: 50 mg via ORAL

## 2015-02-07 NOTE — Pre-Procedure Instructions (Signed)
Tanya at [facility] notified of platelet count of 107; states she relayed info to Dr. Donne Hazel; pt. OK to come for surgery.

## 2015-02-07 NOTE — Telephone Encounter (Signed)
appts made  And pt will get a new sch

## 2015-02-07 NOTE — Patient Instructions (Signed)
[facility] Discharge Instructions  Today you received the following: Herceptin.   To help prevent nausea and vomiting after your treatment, we encourage you to take your nausea medication as directed.    If you develop nausea and vomiting that is not controlled by your nausea medication, call the clinic.   BELOW ARE SYMPTOMS THAT SHOULD BE REPORTED IMMEDIATELY:  *FEVER GREATER THAN 100.5 F  *CHILLS WITH OR WITHOUT FEVER  NAUSEA AND VOMITING THAT IS NOT CONTROLLED WITH YOUR NAUSEA MEDICATION  *UNUSUAL SHORTNESS OF BREATH  *UNUSUAL BRUISING OR BLEEDING  TENDERNESS IN MOUTH AND THROAT WITH OR WITHOUT PRESENCE OF ULCERS  *URINARY PROBLEMS  *BOWEL PROBLEMS  UNUSUAL RASH Items with * indicate a potential emergency and should be followed up as soon as possible.  Feel free to call the clinic you have any questions or concerns. The clinic phone number is [phone].

## 2015-02-07 NOTE — Progress Notes (Signed)
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 The genes tested were ATM, BARD1, BRCA1, BRCA2, BRIP1, CDH1, CHEK2, MRE11A, MUTYH, NBN, NF1, PALB2, PTEN, RAD50, RAD51C, RAD51D, and TP53.   01/30/2015 Breast MRI Left breast: Masses decrease in size from 4 cm to 2.8 cm abuts the pectoralis muscle    CHIEF COMPLIANT: Herceptin maintenance starts Today  INTERVAL HISTORY: Aimee Poole is a 44 year old with above-mentioned history of HER-2 positive left breast cancer treated with neoadjuvant chemotherapy with TCH Perjeta 6 cycles as of 01/17/2015. She underwent an MRI of the breast and is here today to discuss the result. During the process of getting an MRI, she felt dizzy lightheaded and lost consciousness. She was found to be tachycardic and slightly hypotensive and was given IV fluids. Because of that she is improved her symptoms. She still does not have much energy and appetite. She does  not have much taste.  REVIEW OF SYSTEMS:   Constitutional: Denies fevers, chills or abnormal weight loss Eyes: Denies blurriness of vision Ears, nose, mouth, throat, and face: Denies mucositis or sore throat Respiratory: Denies cough, dyspnea or wheezes Cardiovascular: Denies palpitation, chest discomfort or lower extremity swelling Gastrointestinal:  Denies nausea, heartburn or change in bowel habits Skin: Denies abnormal skin rashes Lymphatics: Denies new lymphadenopathy or easy bruising Neurological:Denies numbness, tingling or new weaknesses Behavioral/Psych: Mood is stable, no new changes  Breast:  denies any pain or lumps or nodules in either breasts All other systems were reviewed with the patient and are negative.  I have reviewed the past medical history, past surgical history, social history and family history with the patient and they are unchanged from previous note.  ALLERGIES:  is allergic to corn-containing products.  MEDICATIONS:  Current Outpatient Prescriptions  Medication Sig Dispense Refill  . dexamethasone (DECADRON) 4 MG tablet   1  . diphenoxylate-atropine (LOMOTIL) 2.5-0.025 MG per tablet Take 1 tablet by mouth 4 (four) times daily as needed for diarrhea or loose stools. 30 tablet 0  . Emollient (AQUAPHOR ADVANCED THERAPY) OINT   0  . lidocaine-prilocaine (EMLA) cream Apply 1 application topically as needed. 30 g 3  . LORazepam (ATIVAN) 0.5 MG tablet   0   No current facility-administered medications for this visit.    PHYSICAL EXAMINATION: ECOG PERFORMANCE STATUS: 1 - Symptomatic but completely ambulatory  Filed Vitals:   02/07/15 1338  BP: 98/58  Pulse: 106  Temp: 98.4 F (36.9 C)  Resp: 17   Filed Weights   02/07/15 1338  Weight: 172 lb 8 oz (78.245 kg)    GENERAL:alert, no distress and comfortable SKIN: skin color, texture, turgor are normal, no rashes or significant lesions EYES: normal, Conjunctiva are pink and non-injected, sclera  clear OROPHARYNX:no exudate, no erythema and lips, buccal mucosa, and tongue normal  NECK: supple, thyroid normal size, non-tender, without nodularity LYMPH:  no palpable lymphadenopathy in the cervical, axillary or inguinal LUNGS: clear to auscultation and percussion with normal breathing effort HEART: regular rate & rhythm and no murmurs and no lower extremity edema ABDOMEN:abdomen soft, non-tender and normal bowel sounds Musculoskeletal:no cyanosis of digits and no clubbing  NEURO: alert & oriented x 3 with fluent speech, no focal motor/sensory deficits, grade 1 neuropathy  LABORATORY DATA:  I have reviewed the data as listed   Chemistry      Component Value Date/Time   NA 141 02/07/2015 1322   NA 139 01/24/2015 1120   K 3.8 02/07/2015 1322   K 4.2 01/24/2015 1120   CL 106 01/24/2015 1120   CO2 23 02/07/2015 1322   CO2 28 01/24/2015 1120   BUN 8.4 02/07/2015 1322   BUN 12 01/24/2015 1120   CREATININE 0.8 02/07/2015 1322   CREATININE 0.71 01/24/2015 1120      Component Value Date/Time   CALCIUM 10.1 02/07/2015 1322   CALCIUM 11.0* 01/24/2015 1120   ALKPHOS 66 02/07/2015 1322   ALKPHOS 91 11/01/2009 1212   AST 26 02/07/2015 1322   AST 22 11/01/2009 1212   ALT 18 02/07/2015 1322   ALT 22 11/01/2009 1212   BILITOT 0.33 02/07/2015 1322   BILITOT 0.6 11/01/2009 1212       Lab Results  Component Value Date   WBC 4.1 02/07/2015   HGB 10.9* 02/07/2015   HCT 32.8* 02/07/2015   MCV 95.3 02/07/2015   PLT 107* 02/07/2015   NEUTROABS 2.1 02/07/2015     RADIOGRAPHIC STUDIES: I have personally reviewed the radiology reports and agreed with their findings. 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 clinical stage ER 7% PR 0% HER-2 positive ratio 3.84 in the reconstructed left  breast. Completed 6 cycles of of TCHP  Post neoadjuvant MRI: Decrease in size of the mass from 4 cm to 2.8 cm  Toxicities to Chemo:  Diarrhea, alopecia, fatigue, neutropenia, nausea without vomiting, emotional distress, nail discoloration, rash on the face, chemotherapy induced anemia, thrombocytopenia  Radiology counseling: I discussed the MRI results done showed her the before and after images. Even though the size dimension appeared to be smaller actual tumor itself is significantly much less in comparison to before.  Recurrent dehydration and tachycardia: I would like to give her another 500 mL of normal saline today. Because of poor appetite her oral intake of food has not been as great. She also has occasional diarrhea which contributes to the dehydration.  Patient to undergo Herceptin maintenance starting today area her surgery is being planned for 02/09/2015 with Dr. Donne Hazel.  Monitoring closely for toxicities that chemotherapy. Last echocardiogram 02/01/2015 showed an EF 60-65% Return to clinic in 3 weeks for Herceptin maintenance and to go over the final pathology report.     No orders of the defined types were placed in this  encounter.   The patient has a good understanding of the overall plan. she agrees with it. She will call with any problems that may develop before her next visit here.   Rulon Eisenmenger, MD

## 2015-02-07 NOTE — Assessment & Plan Note (Signed)
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 clinical stage ER 7% PR 0% HER-2 positive ratio 3.84 in the reconstructed left breast. Completed 6 cycles of of TCHP  Post neoadjuvant MRI: Decrease in size of the mass from 4 cm to 2.8 cm  Toxicities to Chemo:  Diarrhea, alopecia, fatigue, neutropenia, nausea without vomiting, emotional distress, nail discoloration, rash on the face, chemotherapy induced anemia  Radiology counseling: I discussed the MRI results done showed her the before and after images. Even though the size dimension appeared to be smaller actual tumor itself is significantly much less in comparison to before.  Patient to undergo Herceptin maintenance starting today area her surgery is being planned for 02/09/2015 with Dr. Donne Hazel.  Monitoring closely for toxicities that chemotherapy. Last echocardiogram 02/01/2015 showed an EF 60-65% Return to clinic in 3 weeks for Herceptin maintenance and to go over the final pathology report.

## 2015-02-09 ENCOUNTER — Ambulatory Visit (HOSPITAL_BASED_OUTPATIENT_CLINIC_OR_DEPARTMENT_OTHER): Payer: BLUE CROSS/BLUE SHIELD | Admitting: Anesthesiology

## 2015-02-09 ENCOUNTER — Ambulatory Visit (HOSPITAL_BASED_OUTPATIENT_CLINIC_OR_DEPARTMENT_OTHER)
Admission: RE | Admit: 2015-02-09 | Discharge: 2015-02-10 | Disposition: A | Discharge status: Home / Self Care | Payer: BLUE CROSS/BLUE SHIELD | Source: Ambulatory Visit | Attending: General Surgery | Admitting: General Surgery

## 2015-02-09 ENCOUNTER — Ambulatory Visit
Admission: RE | Admit: 2015-02-09 | Discharge: 2015-02-09 | Disposition: A | Discharge status: Home / Self Care | Payer: BLUE CROSS/BLUE SHIELD | Source: Ambulatory Visit | Attending: General Surgery | Admitting: General Surgery

## 2015-02-09 ENCOUNTER — Encounter (HOSPITAL_BASED_OUTPATIENT_CLINIC_OR_DEPARTMENT_OTHER): Payer: Self-pay

## 2015-02-09 ENCOUNTER — Encounter (HOSPITAL_BASED_OUTPATIENT_CLINIC_OR_DEPARTMENT_OTHER)
Admission: RE | Disposition: A | Discharge status: Home / Self Care | Payer: Self-pay | Source: Ambulatory Visit | Attending: General Surgery

## 2015-02-09 DIAGNOSIS — N6032 Fibrosclerosis of left breast: Secondary | ICD-10-CM | POA: Diagnosis not present

## 2015-02-09 DIAGNOSIS — Z6832 Body mass index (BMI) 32.0-32.9, adult: Secondary | ICD-10-CM | POA: Insufficient documentation

## 2015-02-09 DIAGNOSIS — I10 Essential (primary) hypertension: Secondary | ICD-10-CM | POA: Insufficient documentation

## 2015-02-09 DIAGNOSIS — N6012 Diffuse cystic mastopathy of left breast: Secondary | ICD-10-CM | POA: Insufficient documentation

## 2015-02-09 DIAGNOSIS — Z9012 Acquired absence of left breast and nipple: Secondary | ICD-10-CM | POA: Diagnosis not present

## 2015-02-09 DIAGNOSIS — Z803 Family history of malignant neoplasm of breast: Secondary | ICD-10-CM | POA: Diagnosis not present

## 2015-02-09 DIAGNOSIS — C50919 Malignant neoplasm of unspecified site of unspecified female breast: Secondary | ICD-10-CM | POA: Diagnosis present

## 2015-02-09 DIAGNOSIS — C50912 Malignant neoplasm of unspecified site of left female breast: Secondary | ICD-10-CM | POA: Diagnosis not present

## 2015-02-09 HISTORY — DX: Cough: R05

## 2015-02-09 HISTORY — DX: Personal history of antineoplastic chemotherapy: Z92.21

## 2015-02-09 HISTORY — DX: Malignant neoplasm of unspecified site of left female breast: C50.912

## 2015-02-09 HISTORY — PX: BREAST LUMPECTOMY WITH RADIOACTIVE SEED LOCALIZATION: SHX6424

## 2015-02-09 SURGERY — BREAST LUMPECTOMY WITH RADIOACTIVE SEED LOCALIZATION
Anesthesia: General | Site: Breast | Laterality: Left

## 2015-02-09 MED ORDER — MIDAZOLAM HCL 2 MG/2ML IJ SOLN
INTRAMUSCULAR | Status: AC
  Filled 2015-02-09: qty 2

## 2015-02-09 MED ORDER — MIDAZOLAM HCL 2 MG/2ML IJ SOLN: 1.0000 mg | INTRAMUSCULAR | Status: DC | PRN

## 2015-02-09 MED ORDER — OXYCODONE HCL 5 MG/5ML PO SOLN: 5.0000 mg | Freq: Once | ORAL | Status: DC | PRN

## 2015-02-09 MED ORDER — MIDAZOLAM HCL 2 MG/ML PO SYRP: 12.0000 mg | ORAL_SOLUTION | Freq: Once | ORAL | Status: DC | PRN

## 2015-02-09 MED ORDER — OXYCODONE-ACETAMINOPHEN 10-325 MG PO TABS: 1.0000 | ORAL_TABLET | Freq: Four times a day (QID) | ORAL | Status: DC | PRN

## 2015-02-09 MED ORDER — OXYCODONE HCL 5 MG PO TABS
5.0000 mg | ORAL_TABLET | ORAL | Status: DC | PRN
  Administered 2015-02-09 – 2015-02-10 (×5): 5 mg via ORAL
  Filled 2015-02-09 (×5): qty 1

## 2015-02-09 MED ORDER — FENTANYL CITRATE 0.05 MG/ML IJ SOLN: 50.0000 ug | INTRAMUSCULAR | Status: DC | PRN

## 2015-02-09 MED ORDER — OXYCODONE HCL 5 MG PO TABS: 5.0000 mg | ORAL_TABLET | Freq: Once | ORAL | Status: DC | PRN

## 2015-02-09 MED ORDER — ESMOLOL HCL 10 MG/ML IV SOLN
INTRAVENOUS | Status: DC | PRN
  Administered 2015-02-09 (×2): 10 mg via INTRAVENOUS

## 2015-02-09 MED ORDER — FENTANYL CITRATE 0.05 MG/ML IJ SOLN
INTRAMUSCULAR | Status: DC | PRN
  Administered 2015-02-09 (×5): 50 ug via INTRAVENOUS

## 2015-02-09 MED ORDER — LACTATED RINGERS IV SOLN
INTRAVENOUS | Status: DC
  Administered 2015-02-09 (×2): via INTRAVENOUS

## 2015-02-09 MED ORDER — ACETAMINOPHEN 325 MG PO TABS: 650.0000 mg | ORAL_TABLET | Freq: Four times a day (QID) | ORAL | Status: DC | PRN

## 2015-02-09 MED ORDER — DEXAMETHASONE SODIUM PHOSPHATE 4 MG/ML IJ SOLN
INTRAMUSCULAR | Status: DC | PRN
  Administered 2015-02-09: 10 mg via INTRAVENOUS

## 2015-02-09 MED ORDER — ACETAMINOPHEN 650 MG RE SUPP: 650.0000 mg | Freq: Four times a day (QID) | RECTAL | Status: DC | PRN

## 2015-02-09 MED ORDER — CEFAZOLIN SODIUM-DEXTROSE 2-3 GM-% IV SOLR: 2.0000 g | INTRAVENOUS | Status: DC

## 2015-02-09 MED ORDER — DIPHENOXYLATE-ATROPINE 2.5-0.025 MG PO TABS: 1.0000 | ORAL_TABLET | Freq: Four times a day (QID) | ORAL | Status: DC | PRN

## 2015-02-09 MED ORDER — BUPIVACAINE HCL (PF) 0.25 % IJ SOLN
INTRAMUSCULAR | Status: AC
  Filled 2015-02-09: qty 30

## 2015-02-09 MED ORDER — BUPIVACAINE HCL (PF) 0.25 % IJ SOLN
INTRAMUSCULAR | Status: DC | PRN
  Administered 2015-02-09: 10 mL

## 2015-02-09 MED ORDER — SODIUM CHLORIDE 0.9 % IV SOLN
INTRAVENOUS | Status: DC
  Administered 2015-02-09: 1 mL via INTRAVENOUS
  Administered 2015-02-09: 10:00:00 via INTRAVENOUS

## 2015-02-09 MED ORDER — MIDAZOLAM HCL 5 MG/5ML IJ SOLN
INTRAMUSCULAR | Status: DC | PRN
  Administered 2015-02-09: 2 mg via INTRAVENOUS

## 2015-02-09 MED ORDER — ONDANSETRON HCL 4 MG/2ML IJ SOLN
INTRAMUSCULAR | Status: DC | PRN
  Administered 2015-02-09: 4 mg via INTRAVENOUS

## 2015-02-09 MED ORDER — FENTANYL CITRATE 0.05 MG/ML IJ SOLN
INTRAMUSCULAR | Status: AC
  Filled 2015-02-09: qty 6

## 2015-02-09 MED ORDER — ONDANSETRON HCL 4 MG/2ML IJ SOLN
4.0000 mg | Freq: Four times a day (QID) | INTRAMUSCULAR | Status: DC | PRN
  Administered 2015-02-09: 4 mg via INTRAVENOUS
  Filled 2015-02-09: qty 2

## 2015-02-09 MED ORDER — CEFAZOLIN SODIUM-DEXTROSE 2-3 GM-% IV SOLR
INTRAVENOUS | Status: AC
  Filled 2015-02-09: qty 50

## 2015-02-09 MED ORDER — PROMETHAZINE HCL 25 MG/ML IJ SOLN: 6.2500 mg | INTRAMUSCULAR | Status: DC | PRN

## 2015-02-09 MED ORDER — HYDROMORPHONE HCL 1 MG/ML IJ SOLN: 0.2500 mg | INTRAMUSCULAR | Status: DC | PRN

## 2015-02-09 MED ORDER — LIDOCAINE HCL (CARDIAC) 20 MG/ML IV SOLN
INTRAVENOUS | Status: DC | PRN
  Administered 2015-02-09: 50 mg via INTRAVENOUS

## 2015-02-09 MED ORDER — MORPHINE SULFATE 2 MG/ML IJ SOLN: 2.0000 mg | INTRAMUSCULAR | Status: DC | PRN

## 2015-02-09 MED ORDER — CEFAZOLIN SODIUM-DEXTROSE 2-3 GM-% IV SOLR
INTRAVENOUS | Status: DC | PRN
  Administered 2015-02-09: 2 g via INTRAVENOUS

## 2015-02-09 MED ORDER — PROPOFOL 10 MG/ML IV BOLUS
INTRAVENOUS | Status: DC | PRN
  Administered 2015-02-09: 150 mg via INTRAVENOUS

## 2015-02-09 SURGICAL SUPPLY — 62 items
APL SKNCLS STERI-STRIP NONHPOA (GAUZE/BANDAGES/DRESSINGS)
APPLIER CLIP 9.375 MED OPEN (MISCELLANEOUS)
APR CLP MED 9.3 20 MLT OPN (MISCELLANEOUS)
BENZOIN TINCTURE PRP APPL 2/3 (GAUZE/BANDAGES/DRESSINGS) IMPLANT
BINDER BREAST LRG (GAUZE/BANDAGES/DRESSINGS) IMPLANT
BINDER BREAST MEDIUM (GAUZE/BANDAGES/DRESSINGS) IMPLANT
BINDER BREAST XLRG (GAUZE/BANDAGES/DRESSINGS) ×2 IMPLANT
BINDER BREAST XXLRG (GAUZE/BANDAGES/DRESSINGS) IMPLANT
BIOPATCH RED 1 DISK 7.0 (GAUZE/BANDAGES/DRESSINGS) ×2 IMPLANT
BLADE SURG 15 STRL LF DISP TIS (BLADE) ×1 IMPLANT
BLADE SURG 15 STRL SS (BLADE) ×2
CANISTER SUC SOCK COL 7IN (MISCELLANEOUS) IMPLANT
CANISTER SUCT 1200ML W/VALVE (MISCELLANEOUS) IMPLANT
CHLORAPREP W/TINT 26ML (MISCELLANEOUS) ×2 IMPLANT
CLIP APPLIE 9.375 MED OPEN (MISCELLANEOUS) IMPLANT
COVER BACK TABLE 60X90IN (DRAPES) ×2 IMPLANT
COVER MAYO STAND STRL (DRAPES) ×2 IMPLANT
COVER PROBE W GEL 5X96 (DRAPES) ×2 IMPLANT
DECANTER SPIKE VIAL GLASS SM (MISCELLANEOUS) IMPLANT
DEVICE DUBIN W/COMP PLATE 8390 (MISCELLANEOUS) ×2 IMPLANT
DRAIN CHANNEL 19F RND (DRAIN) ×2 IMPLANT
DRAPE LAPAROSCOPIC ABDOMINAL (DRAPES) ×2 IMPLANT
DRAPE UTILITY XL STRL (DRAPES) ×2 IMPLANT
DRSG TEGADERM 4X4.75 (GAUZE/BANDAGES/DRESSINGS) ×2 IMPLANT
ELECT COATED BLADE 2.86 ST (ELECTRODE) ×2 IMPLANT
ELECT REM PT RETURN 9FT ADLT (ELECTROSURGICAL) ×2
ELECTRODE REM PT RTRN 9FT ADLT (ELECTROSURGICAL) ×1 IMPLANT
EVACUATOR SILICONE 100CC (DRAIN) ×2 IMPLANT
GLOVE BIO SURGEON STRL SZ7 (GLOVE) ×6 IMPLANT
GLOVE BIOGEL PI IND STRL 7.0 (GLOVE) ×2 IMPLANT
GLOVE BIOGEL PI IND STRL 7.5 (GLOVE) ×1 IMPLANT
GLOVE BIOGEL PI INDICATOR 7.0 (GLOVE) ×2
GLOVE BIOGEL PI INDICATOR 7.5 (GLOVE) ×1
GLOVE ECLIPSE 6.5 STRL STRAW (GLOVE) ×2 IMPLANT
GOWN STRL REUS W/ TWL LRG LVL3 (GOWN DISPOSABLE) ×2 IMPLANT
GOWN STRL REUS W/TWL LRG LVL3 (GOWN DISPOSABLE) ×4
KIT MARKER MARGIN INK (KITS) ×2 IMPLANT
LIQUID BAND (GAUZE/BANDAGES/DRESSINGS) ×2 IMPLANT
MARKER SKIN DUAL TIP RULER LAB (MISCELLANEOUS) ×2 IMPLANT
NEEDLE HYPO 25X1 1.5 SAFETY (NEEDLE) ×2 IMPLANT
NS IRRIG 1000ML POUR BTL (IV SOLUTION) ×2 IMPLANT
PACK BASIN DAY SURGERY FS (CUSTOM PROCEDURE TRAY) ×2 IMPLANT
PENCIL BUTTON HOLSTER BLD 10FT (ELECTRODE) ×2 IMPLANT
SLEEVE SCD COMPRESS KNEE MED (MISCELLANEOUS) ×2 IMPLANT
SPONGE GAUZE 4X4 12PLY STER LF (GAUZE/BANDAGES/DRESSINGS) IMPLANT
SPONGE LAP 4X18 X RAY DECT (DISPOSABLE) ×2 IMPLANT
STAPLER VISISTAT 35W (STAPLE) IMPLANT
STRIP CLOSURE SKIN 1/2X4 (GAUZE/BANDAGES/DRESSINGS) ×2 IMPLANT
SUT ETHILON 2 0 FS 18 (SUTURE) ×2 IMPLANT
SUT MNCRL AB 4-0 PS2 18 (SUTURE) ×2 IMPLANT
SUT MON AB 5-0 PS2 18 (SUTURE) IMPLANT
SUT SILK 2 0 SH (SUTURE) ×4 IMPLANT
SUT VIC AB 2-0 SH 27 (SUTURE) ×4
SUT VIC AB 2-0 SH 27XBRD (SUTURE) ×2 IMPLANT
SUT VIC AB 3-0 SH 27 (SUTURE) ×2
SUT VIC AB 3-0 SH 27X BRD (SUTURE) ×1 IMPLANT
SUT VIC AB 5-0 PS2 18 (SUTURE) IMPLANT
SYR CONTROL 10ML LL (SYRINGE) ×2 IMPLANT
TOWEL OR 17X24 6PK STRL BLUE (TOWEL DISPOSABLE) ×2 IMPLANT
TOWEL OR NON WOVEN STRL DISP B (DISPOSABLE) ×2 IMPLANT
TUBE CONNECTING 20X1/4 (TUBING) ×2 IMPLANT
YANKAUER SUCT BULB TIP NO VENT (SUCTIONS) IMPLANT

## 2015-02-09 NOTE — Anesthesia Postprocedure Evaluation (Signed)
  Anesthesia Post-op Note  Patient: Aimee Poole  Procedure(s) Performed: Procedure(s): LEFT BREAST LUMPECTOMY WITH RADIOACTIVE SEED LOCALIZATION (Left)  Patient Location: PACU  Anesthesia Type:General  Level of Consciousness: awake and alert   Airway and Oxygen Therapy: Patient Spontanous Breathing  Post-op Pain: mild  Post-op Assessment: Post-op Vital signs reviewed  Post-op Vital Signs: Reviewed  Last Vitals:  Filed Vitals:   02/09/15 0945  BP: 119/80  Pulse: 102  Temp: 35.9 C  Resp: 16    Complications: No apparent anesthesia complications

## 2015-02-09 NOTE — Interval H&P Note (Signed)
History and Physical Interval Note:  02/09/2015 7:20 AM  Aimee Poole  has presented today for surgery, with the diagnosis of RECURRENT LEFT BREAST CANCER  The various methods of treatment have been discussed with the patient and family. After consideration of risks, benefits and other options for treatment, the patient has consented to  Procedure(s): LEFT BREAST LUMPECTOMY WITH RADIOACTIVE SEED LOCALIZATION (Left) as a surgical intervention .  The patient's history has been reviewed, patient examined, no change in status, stable for surgery.  I have reviewed the patient's chart and labs.  Questions were answered to the patient's satisfaction.     Star Cheese

## 2015-02-09 NOTE — Transfer of Care (Signed)
Immediate Anesthesia Transfer of Care Note  Patient: Aimee Poole  Procedure(s) Performed: Procedure(s): LEFT BREAST LUMPECTOMY WITH RADIOACTIVE SEED LOCALIZATION (Left)  Patient Location: PACU  Anesthesia Type:General  Level of Consciousness: awake and patient cooperative  Airway & Oxygen Therapy: Patient Spontanous Breathing and Patient connected to face mask oxygen  Post-op Assessment: Report given to RN and Post -op Vital signs reviewed and stable  Post vital signs: Reviewed and stable  Last Vitals:  Filed Vitals:   02/09/15 0626  BP: 110/74  Pulse: 106  Temp: 36.7 C  Resp: 20    Complications: No apparent anesthesia complications

## 2015-02-09 NOTE — Anesthesia Preprocedure Evaluation (Addendum)
Anesthesia Evaluation  Patient identified by MRN, date of birth, ID band Patient awake    Reviewed: Allergy & Precautions, NPO status , Patient's Chart, lab work & pertinent test results  Airway Mallampati: III  TM Distance: >3 FB Neck ROM: Full    Dental  (+) Teeth Intact, Dental Advisory Given   Pulmonary neg pulmonary ROS,          Cardiovascular negative cardio ROS  Rhythm:Regular Rate:Normal     Neuro/Psych negative neurological ROS     GI/Hepatic negative GI ROS, Neg liver ROS,   Endo/Other  Morbid obesity  Renal/GU negative Renal ROS     Musculoskeletal   Abdominal   Peds  Hematology negative hematology ROS (+)   Anesthesia Other Findings   Reproductive/Obstetrics                            Anesthesia Physical Anesthesia Plan  ASA: II  Anesthesia Plan: General   Post-op Pain Management:    Induction: Intravenous  Airway Management Planned: LMA  Additional Equipment:   Intra-op Plan:   Post-operative Plan: Extubation in OR  Informed Consent: I have reviewed the patients History and Physical, chart, labs and discussed the procedure including the risks, benefits and alternatives for the proposed anesthesia with the patient or authorized representative who has indicated his/her understanding and acceptance.   Dental advisory given  Plan Discussed with: CRNA  Anesthesia Plan Comments:         Anesthesia Quick Evaluation

## 2015-02-09 NOTE — H&P (Signed)
  44 yof who presented with recurrence after left mastectomy/sn biopsy for dcis in 2010. She has mass in lateral portion of her tram flap that underwent biopsy and was a grade III, weakly er pos, pr negative, her2 amplified IDC. She has undergone tch. Her recurrence has gone from 4x3.9x1.6 cm to 2.8x0.9x1.1 cm in size. her nodes all remain normal as does her right breast. She comes in today after mri to discuss surgery again. She has been dehydrated lately and had syncopal episode during mri. She has a little nonproductive cough and had been having diarrhea. Has been seen in er and in cancer center and no other source found   Other Problems Rolm Bookbinder, MD; 02/01/2015 9:33 AM) Breast Cancer High blood pressure RECURRENT BREAST CANCER, LEFT (174.9  C50.912)  Past Surgical History Rolm Bookbinder, MD; 02/01/2015 9:33 AM) Breast Reconstruction Left. Cesarean Section - 1 Mammoplasty; Reduction Right. Mastectomy Left. Sentinel Lymph Node Biopsy  Allergies Rolm Bookbinder, MD; 02/01/2015 9:33 AM) No Known Drug Allergies10/23/2015  Medication History Lars Mage [place], MA; 02/01/2015 8:37 AM) Medications Reconciled  Social History Rolm Bookbinder, MD; 02/01/2015 9:33 AM) Caffeine use Carbonated beverages, Coffee, Tea. No alcohol use No drug use Tobacco use Never smoker.  ROS positive for nonproductive cough  Vitals (Alisha Spillers MA; 02/01/2015 8:37 AM) 02/01/2015 8:36 AM Weight: 176 lb Pulse: 138 (Regular)  BP: 112/70 (Sitting, Left Arm, Standard) Physical Exam Rolm Bookbinder MD; 02/01/2015 9:31 AM) General Mental Status-Alert. Orientation-Oriented X3. Chest and Lung Exam Chest and lung exam reveals -on auscultation, normal breath sounds, no adventitious sounds and normal vocal resonance. Breast Note: left breast with tram reconstruction and vague mass lateral near anterior axillary line Cardiovascular Cardiovascular examination reveals  -normal heart sounds, regular rate and rhythm with no murmurs. Lymphatic Note: no left axillary adenopathy   Assessment & Plan Rolm Bookbinder MD; 02/01/2015 9:32 AM) RECURRENT BREAST CANCER, LEFT (174.9  C50.912) Story: Left radioactive seed guided excision of chest wall recurrence  discussed performance of procedure and possibility of positive margin with excision. discussed radioactive seed as well as her good response to chemotherapy.

## 2015-02-09 NOTE — Op Note (Signed)
Preoperative diagnosis: left breast cancer, chest wall recurrence, s/p primary chemotherapy Postoperative diagnosis: same as above Procedure: left breast seed guided excision of chest wall recurrence Surgeon: Dr Serita Grammes Anesthesia: general EBL: minimal Specimens: #1 left breast tissue marked with paint with radioactive seed #2 additional superior x2, lateral, medial, inferior margins marked short stitch superior, long stitch lateral, double stitch deep The clip was in the first superior margin Drains: 19 [place] Blake drain Complications: None Sponge and needle count was correct at completion This patient to recovery stable  Indications: This is a 44 year old female who had a large area of high-grade DCIS in 2010 showed a mastectomy and a TRAM reconstruction. She now has a lateral chest wall recurrence of HER-2 positive invasive ductal carcinoma. She has undergone primary chemotherapy. She has tolerated this well and it does appear that she has had a very good response. We discussed a radioactive seed localized excision of this area.  Procedure: Patient first had a radioactive seed placement Dr. Radford Pax. I had these mammograms in the operating room. She was then brought to day surgery. After informed consent was obtained she was taken to the operating room. She was placed under general anesthesia. Cefazolin was administered. Sequential compression devices were on her legs. She was in place under general anesthesia without complication. She was then prepped and draped in the standard sterile surgical fashion. Surgical timeout was then performed.  I entered her lateral portion of her old incision. I then used the neoprobe to guide excision of the radioactive seed in the surrounding tissue. This was very difficult due to the scarring that was present. I then confirmed removal of the seed with the neoprobe as well as the mammogram. The clip was not present on the first mammogram. I shaved the  superior, lateral, medial, and inferior margins of my cavity and marked these as above. The clip was present in the superior margin. I did remove an additional superior margin. I was down to her latissimus as well as her pectoralis muscle doing this. I then obtained hemostasis. Irrigation was performed. Due to the large size of the cavity and amount of scar tissue I elected to place a 19 [place] Blake drain. I secured this with a 2-0 nylon. I then closed this with 2-0 Vicryl, 3-0 Vicryl, and 4-0 Monocryl. Glue and Steri-Strips were placed. A Biopatch was placed on the drain. A binder was placed. She tolerated this well and was transferred to recovery room in stable condition.

## 2015-02-09 NOTE — Anesthesia Procedure Notes (Signed)
Procedure Name: LMA Insertion Date/Time: 02/09/2015 7:26 AM Performed by: Toula Moos L Pre-anesthesia Checklist: Patient identified, Emergency Drugs available, Suction available, Patient being monitored and Timeout performed Patient Re-evaluated:Patient Re-evaluated prior to inductionOxygen Delivery Method: Circle System Utilized Preoxygenation: Pre-oxygenation with 100% oxygen Intubation Type: IV induction Ventilation: Mask ventilation without difficulty LMA: LMA inserted LMA Size: 4.0 Number of attempts: 1 Airway Equipment and Method: Bite block Placement Confirmation: positive ETCO2 Tube secured with: Tape Dental Injury: Teeth and Oropharynx as per pre-operative assessment

## 2015-02-10 ENCOUNTER — Encounter (HOSPITAL_BASED_OUTPATIENT_CLINIC_OR_DEPARTMENT_OTHER): Payer: Self-pay | Admitting: General Surgery

## 2015-02-10 ENCOUNTER — Telehealth: Payer: Self-pay | Admitting: *Deleted

## 2015-02-10 DIAGNOSIS — C50912 Malignant neoplasm of unspecified site of left female breast: Secondary | ICD-10-CM | POA: Diagnosis not present

## 2015-02-10 NOTE — Telephone Encounter (Signed)
Letter faxed to no provided.  Sent to scan.

## 2015-02-10 NOTE — Telephone Encounter (Signed)
Patient called asking for "letter or documentation that she was out of work due to illness week of 01-30-2015 through 02-03-2015 sent to [employer]."  Reports having discussed short term disability with Dr. Lindi Adie.  FMLA to be out for three days was completed November 2015.  "I Passed out with MRI on 01-30-2015.  Echo completed on 02-01-2015.  I was sick, in pain and light headed all week and did not go to work.  Surgery performed yesterday and now [employer] does not want to pay me for FMLA."  Will notify provider of this request.  Does not know [employer] fax number at this time.  Return number [phone].

## 2015-02-10 NOTE — Telephone Encounter (Signed)
CLAIM #[id number]/ SENT LETTER TO FAX [phone]

## 2015-02-10 NOTE — Telephone Encounter (Signed)
Let pt know letter is ready.  Pt will return call with number to fax it to.  Pt voiced understanding.

## 2015-02-10 NOTE — Discharge Instructions (Signed)
[facility] °Office Phone Number [phone] ° °BREAST BIOPSY/ PARTIAL MASTECTOMY: POST OP INSTRUCTIONS ° °Always review your discharge instruction sheet given to you by the facility where your surgery was performed. ° °IF YOU HAVE DISABILITY OR FAMILY LEAVE FORMS, YOU MUST BRING THEM TO THE OFFICE FOR PROCESSING.  DO NOT GIVE THEM TO YOUR DOCTOR. ° °1. A prescription for pain medication may be given to you upon discharge.  Take your pain medication as prescribed, if needed.  If narcotic pain medicine is not needed, then you may take acetaminophen (Tylenol), naprosyn (Alleve) or ibuprofen (Advil) as needed. °2. Take your usually prescribed medications unless otherwise directed °3. If you need a refill on your pain medication, please contact your pharmacy.  They will contact our office to request authorization.  Prescriptions will not be filled after 5pm or on week-ends. °4. You should eat very light the first 24 hours after surgery, such as soup, crackers, pudding, etc.  Resume your normal diet the day after surgery. °5. Most patients will experience some swelling and bruising in the breast.  Ice packs and a good support bra will help.  Wear the breast binder provided or a sports bra for 72 hours day and night.  After that wear a sports bra during the day until you return to the office. Swelling and bruising can take several days to resolve.  °6. It is common to experience some constipation if taking pain medication after surgery.  Increasing fluid intake and taking a stool softener will usually help or prevent this problem from occurring.  A mild laxative (Milk of Magnesia or Miralax) should be taken according to package directions if there are no bowel movements after 48 hours. °7. Unless discharge instructions indicate otherwise, you may remove your bandages 48 hours after surgery and you may shower at that time.  You may have steri-strips (small skin tapes) in place directly over the incision.   These strips should be left on the skin for 7-10 days and will come off on their own.  If your surgeon used skin glue on the incision, you may shower in 24 hours.  The glue will flake off over the next 2-3 weeks.  Any sutures or staples will be removed at the office during your follow-up visit. °8. ACTIVITIES:  You may resume regular daily activities (gradually increasing) beginning the next day.  Wearing a good support bra or sports bra minimizes pain and swelling.  You may have sexual intercourse when it is comfortable. °a. You may drive when you no longer are taking prescription pain medication, you can comfortably wear a seatbelt, and you can safely maneuver your car and apply brakes. °b. RETURN TO WORK:  ______________________________________________________________________________________ °9. You should see your doctor in the office for a follow-up appointment approximately two weeks after your surgery.  Your doctor’s nurse will typically make your follow-up appointment when she calls you with your pathology report.  Expect your pathology report 3-4 business days after your surgery.  You may call to check if you do not hear from us after three days. °10. OTHER INSTRUCTIONS: _______________________________________________________________________________________________ _____________________________________________________________________________________________________________________________________ °_____________________________________________________________________________________________________________________________________ °_____________________________________________________________________________________________________________________________________ ° °WHEN TO CALL DR WAKEFIELD: °1. Fever over 101.0 °2. Nausea and/or vomiting. °3. Extreme swelling or bruising. °4. Continued bleeding from incision. °5. Increased pain, redness, or drainage from the incision. ° °The clinic staff is available to  answer your questions during regular business hours.  Please don’t hesitate to call and ask to speak to one of the nurses for   clinical concerns.  If you have a medical emergency, go to the nearest emergency room or call 911.  A surgeon from [facility] is always on call at the hospital.  For further questions, please visit [email or web address]   Empty drain twice daily and record outputs    JP Drain Totals  Bring this sheet to all of your post-operative appointments while you have your drains.  Please measure your drains by CC's or ML's.  Make sure you drain and measure your JP Drains 2 or 3 times per day.  At the end of each day, add up totals for the left side and add up totals for the right side.    ( 9 am )     ( 3 pm )        ( 9 pm )                Date L  R  L  R  L  R  Total L/R

## 2015-02-14 ENCOUNTER — Telehealth: Payer: Self-pay | Admitting: *Deleted

## 2015-02-14 NOTE — Telephone Encounter (Signed)
Patient called with the fax number for [employer].  It is [phone].  Printed and faxed the letter from Dr. Lindi Adie regarding her need to be out of work from 3/7 to 02/03/15.

## 2015-02-23 ENCOUNTER — Telehealth: Payer: Self-pay | Admitting: Hematology and Oncology

## 2015-02-23 NOTE — Telephone Encounter (Signed)
Returned patients call as she left a message to reschedule her 4/5 appointments due to being out of town,i left a message with the next avail anne

## 2015-02-24 ENCOUNTER — Ambulatory Visit
Admission: RE | Admit: 2015-02-24 | Discharge: 2015-02-24 | Disposition: A | Discharge status: Home / Self Care | Payer: BLUE CROSS/BLUE SHIELD | Source: Ambulatory Visit | Attending: Radiation Oncology | Admitting: Radiation Oncology

## 2015-02-24 ENCOUNTER — Encounter: Payer: Self-pay | Admitting: Radiation Oncology

## 2015-02-24 VITALS — BP 126/75 | HR 88 | Temp 98.5°F | Resp 20 | Ht 61.5 in | Wt 176.0 lb

## 2015-02-24 DIAGNOSIS — C50412 Malignant neoplasm of upper-outer quadrant of left female breast: Secondary | ICD-10-CM | POA: Insufficient documentation

## 2015-02-24 NOTE — Progress Notes (Signed)
   Department of Radiation Oncology  Phone:  [phone] Fax:        [phone]   Name: Aimee Poole MRN: [id number]  DOB: [birth year]  Date: 02/24/2015  Follow Up Visit Note  Diagnosis: Breast cancer of upper-outer quadrant of left female breast   Staging form: Breast, AJCC 7th Edition     Clinical: Stage IIA (T2, N0, cM0) - Signed by Rulon Eisenmenger, MD on 09/20/2014     Pathologic: Stage 0 (Tis, N0, cM0) - Signed by Rolm Bookbinder, MD on 03/13/2012  Interval History: Aimee Poole presents today for routine followup.  She is feeling well and doing well. She had a pathologic complete response after her excision on 3/17. She had no lymph nodes removed. Her drain is out and she is healing well. She is accompanied by her daughter today. She has no complaints. She wonders if she needs radiation if there was no tumor left behind. She would like to extend her disability to cover her while she is receiving radiation.   Physical Exam:  Filed Vitals:   02/24/15 0808  BP: 126/75  Pulse: 88  Temp: 98.5 F (36.9 C)  TempSrc: Oral  Resp: 20  Height: 5' 1.5" (1.562 m)  Weight: 176 lb (79.833 kg)   Pleasant female. Healing incision on the lateral aspect of the left breast. Some granuatlion tissue in the drain wound and this is not completely closed. There is no evidence of infection.   IMPRESSION: Aimee Poole is a 44 y.o. female s/p excision of a recurrence in her left TRAM flap with a path CR after enoadjuvant HEr2 based chemotherapy.   PLAN:  We discussed the role of radiation and decreasing local failures in patients who undergo lumpectomy. We discussed the retrospective data showing an increase in failure rates in patients who have a pathologic complete response and did not undergo radiation in the primary setting. 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  In the discussion of the NCCN guidelines regarding  Management of local only recurrence, the  panel recommends involved field radiation to the chest wall and supraclavicular area after surgical resection. I discussed this with her.   I have recommended radiation to the TRAM flap  followed by boost to the tumor bed in addition to treatment of her axillary and supraclavicular lymph nodes. Given the extreme laterality of this lesion, I do not believe IM nodal treatment is warranted. We discussed the process of simulation the placement tattoos. We discussed possible side effects during treatment including but not limited to skin irritation darkness and fatigue. We discussed long-term effects of treatment which are extremely unlikely but possible including damage to the lungs and ribs. We talked about the use of breath hold technique for cardiac sparing. We discussed the low likelihood of secondary malignancies. I let her know that nursing would be doing skin teaching with her. She signed informed consent and agree to proceed forward.   Thea Silversmith, MD

## 2015-02-24 NOTE — Progress Notes (Signed)
FuNC left breast, incison healed, some dryness, numbness under  Breast toward back, soreness ,takes perocet once daily, 2nd Herceptin scheduled 03/06/15, wants to reschedule  Dr. Melissa Montane 02/27/15  8:16 AM

## 2015-02-24 NOTE — Progress Notes (Signed)
Please see the Nurse Progress Note in the MD Initial Consult Encounter for this patient. 

## 2015-02-28 ENCOUNTER — Ambulatory Visit: Payer: BLUE CROSS/BLUE SHIELD | Admitting: Hematology and Oncology

## 2015-02-28 ENCOUNTER — Telehealth: Payer: Self-pay

## 2015-02-28 ENCOUNTER — Other Ambulatory Visit: Payer: BLUE CROSS/BLUE SHIELD

## 2015-02-28 ENCOUNTER — Ambulatory Visit: Payer: BLUE CROSS/BLUE SHIELD

## 2015-02-28 NOTE — Telephone Encounter (Signed)
Patient cleared by surgeon to start radiation per Elmo Putt.ct simulation notified Melissa RT.Orders already in epic.

## 2015-03-01 ENCOUNTER — Telehealth: Payer: Self-pay

## 2015-03-01 NOTE — Telephone Encounter (Signed)
Ryan from [facility] called to inquire about why patient will require extended fmla and what her limitations would include.Informed Thurmond Butts that patient is still under going chemotherapy and will not be simulated for radiation until 03/08/15, at that time would know how long radiation treatment would be.Also stated that Dr.Wentworth would have to write what limitations for patient will be.Contact number for Thurmond Butts is [phone].Patient did sign release of information form to speak with [employer] which I will have scanned in by clerical staff.

## 2015-03-02 ENCOUNTER — Telehealth: Payer: Self-pay

## 2015-03-02 ENCOUNTER — Other Ambulatory Visit: Payer: Self-pay | Admitting: *Deleted

## 2015-03-02 DIAGNOSIS — C50412 Malignant neoplasm of upper-outer quadrant of left female breast: Secondary | ICD-10-CM

## 2015-03-02 NOTE — Telephone Encounter (Signed)
Left message for Thurmond Butts to return call to discuss extension for leave during radiation as she states she is to return to work on April 10,2016.

## 2015-03-06 ENCOUNTER — Other Ambulatory Visit (HOSPITAL_BASED_OUTPATIENT_CLINIC_OR_DEPARTMENT_OTHER): Payer: BLUE CROSS/BLUE SHIELD

## 2015-03-06 ENCOUNTER — Ambulatory Visit (HOSPITAL_BASED_OUTPATIENT_CLINIC_OR_DEPARTMENT_OTHER): Payer: BLUE CROSS/BLUE SHIELD | Admitting: Hematology and Oncology

## 2015-03-06 ENCOUNTER — Ambulatory Visit: Payer: BLUE CROSS/BLUE SHIELD

## 2015-03-06 ENCOUNTER — Ambulatory Visit (HOSPITAL_BASED_OUTPATIENT_CLINIC_OR_DEPARTMENT_OTHER): Payer: BLUE CROSS/BLUE SHIELD

## 2015-03-06 VITALS — BP 125/68 | HR 92 | Temp 97.7°F | Resp 18 | Ht 61.5 in | Wt 176.5 lb

## 2015-03-06 DIAGNOSIS — M791 Myalgia: Secondary | ICD-10-CM

## 2015-03-06 DIAGNOSIS — Z95828 Presence of other vascular implants and grafts: Secondary | ICD-10-CM

## 2015-03-06 DIAGNOSIS — C50412 Malignant neoplasm of upper-outer quadrant of left female breast: Secondary | ICD-10-CM | POA: Diagnosis not present

## 2015-03-06 DIAGNOSIS — Z5112 Encounter for antineoplastic immunotherapy: Secondary | ICD-10-CM

## 2015-03-06 LAB — CBC WITH DIFFERENTIAL/PLATELET
BASO%: 0.3 % (ref 0.0–2.0)
BASOS ABS: 0 10*3/uL (ref 0.0–0.1)
EOS ABS: 0.1 10*3/uL (ref 0.0–0.5)
EOS%: 1.2 % (ref 0.0–7.0)
HEMATOCRIT: 33.8 % — AB (ref 34.8–46.6)
HGB: 11.2 g/dL — ABNORMAL LOW (ref 11.6–15.9)
LYMPH#: 1.6 10*3/uL (ref 0.9–3.3)
LYMPH%: 36.1 % (ref 14.0–49.7)
MCH: 31.6 pg (ref 25.1–34.0)
MCHC: 33 g/dL (ref 31.5–36.0)
MCV: 95.9 fL (ref 79.5–101.0)
MONO#: 0.3 10*3/uL (ref 0.1–0.9)
MONO%: 6.4 % (ref 0.0–14.0)
NEUT#: 2.5 10*3/uL (ref 1.5–6.5)
NEUT%: 56 % (ref 38.4–76.8)
PLATELETS: 274 10*3/uL (ref 145–400)
RBC: 3.53 10*6/uL — ABNORMAL LOW (ref 3.70–5.45)
RDW: 14.2 % (ref 11.2–14.5)
WBC: 4.5 10*3/uL (ref 3.9–10.3)

## 2015-03-06 LAB — COMPREHENSIVE METABOLIC PANEL (CC13)
ALT: 37 U/L (ref 0–55)
ANION GAP: 8 meq/L (ref 3–11)
AST: 31 U/L (ref 5–34)
Albumin: 3.4 g/dL — ABNORMAL LOW (ref 3.5–5.0)
Alkaline Phosphatase: 128 U/L (ref 40–150)
BILIRUBIN TOTAL: 0.36 mg/dL (ref 0.20–1.20)
BUN: 9.6 mg/dL (ref 7.0–26.0)
CO2: 24 meq/L (ref 22–29)
Calcium: 10.5 mg/dL — ABNORMAL HIGH (ref 8.4–10.4)
Chloride: 109 mEq/L (ref 98–109)
Creatinine: 0.7 mg/dL (ref 0.6–1.1)
GLUCOSE: 118 mg/dL (ref 70–140)
Potassium: 3.6 mEq/L (ref 3.5–5.1)
Sodium: 141 mEq/L (ref 136–145)
Total Protein: 6.7 g/dL (ref 6.4–8.3)

## 2015-03-06 MED ORDER — SODIUM CHLORIDE 0.9 % IJ SOLN
10.0000 mL | INTRAMUSCULAR | Status: DC | PRN
  Administered 2015-03-06: 10 mL via INTRAVENOUS
  Filled 2015-03-06: qty 10

## 2015-03-06 MED ORDER — DIPHENHYDRAMINE HCL 25 MG PO CAPS
ORAL_CAPSULE | ORAL | Status: AC
  Filled 2015-03-06: qty 2

## 2015-03-06 MED ORDER — SODIUM CHLORIDE 0.9 % IV SOLN
6.0000 mg/kg | Freq: Once | INTRAVENOUS | Status: AC
  Administered 2015-03-06: 462 mg via INTRAVENOUS
  Filled 2015-03-06: qty 22

## 2015-03-06 MED ORDER — HEPARIN SOD (PORK) LOCK FLUSH 100 UNIT/ML IV SOLN
500.0000 [IU] | Freq: Once | INTRAVENOUS | Status: AC | PRN
  Administered 2015-03-06: 500 [IU]
  Filled 2015-03-06: qty 5

## 2015-03-06 MED ORDER — SODIUM CHLORIDE 0.9 % IJ SOLN
10.0000 mL | INTRAMUSCULAR | Status: DC | PRN
  Administered 2015-03-06: 10 mL
  Filled 2015-03-06: qty 10

## 2015-03-06 MED ORDER — ACETAMINOPHEN 325 MG PO TABS
650.0000 mg | ORAL_TABLET | Freq: Once | ORAL | Status: AC
  Administered 2015-03-06: 650 mg via ORAL

## 2015-03-06 MED ORDER — ACETAMINOPHEN 325 MG PO TABS
ORAL_TABLET | ORAL | Status: AC
  Filled 2015-03-06: qty 2

## 2015-03-06 MED ORDER — DIPHENHYDRAMINE HCL 25 MG PO CAPS
50.0000 mg | ORAL_CAPSULE | Freq: Once | ORAL | Status: AC
  Administered 2015-03-06: 50 mg via ORAL

## 2015-03-06 MED ORDER — SODIUM CHLORIDE 0.9 % IV SOLN: Freq: Once | INTRAVENOUS | Status: DC

## 2015-03-06 NOTE — Patient Instructions (Signed)

## 2015-03-06 NOTE — Assessment & Plan Note (Signed)
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 clinical stage ER 7% PR 0% HER-2 positive ratio 3.84 in the reconstructed left breast. Completed 6 cycles of of TCHP  Post neoadjuvant MRI: Decrease in size of the mass from 4 cm to 2.8 cm; Pathology counseling:  pathologic complete response based on lumpectomy March 2016   Current treatment:  1. Herceptin maintenance once every 3 weeks  2. Adjuvant radiation therapy  Myalgias: I discussed with her that these could be steroid withdrawal symptoms. I instructed her to exercise and walk daily. This will slowly go away.  Return to clinic every 3 weeks for Herceptin maintenance every 6 weeks to follow up with me.

## 2015-03-06 NOTE — Patient Instructions (Signed)
[facility] Discharge Instructions for Patients Receiving Chemotherapy  Today you received the following chemotherapy agents: herceptin  To help prevent nausea and vomiting after your treatment, we encourage you to take your nausea medication.  Take it as often as prescribed.     If you develop nausea and vomiting that is not controlled by your nausea medication, call the clinic. If it is after clinic hours your family physician or the after hours number for the clinic or go to the Emergency Department.   BELOW ARE SYMPTOMS THAT SHOULD BE REPORTED IMMEDIATELY:  *FEVER GREATER THAN 100.5 F  *CHILLS WITH OR WITHOUT FEVER  NAUSEA AND VOMITING THAT IS NOT CONTROLLED WITH YOUR NAUSEA MEDICATION  *UNUSUAL SHORTNESS OF BREATH  *UNUSUAL BRUISING OR BLEEDING  TENDERNESS IN MOUTH AND THROAT WITH OR WITHOUT PRESENCE OF ULCERS  *URINARY PROBLEMS  *BOWEL PROBLEMS  UNUSUAL RASH Items with * indicate a potential emergency and should be followed up as soon as possible.  Feel free to call the clinic you have any questions or concerns. The clinic phone number is [phone].   I have been informed and understand all the instructions given to me. I know to contact the clinic, my physician, or go to the Emergency Department if any problems should occur. I do not have any questions at this time, but understand that I may call the clinic during office hours   should I have any questions or need assistance in obtaining follow up care.    __________________________________________  _____________  __________ Signature of Patient or Authorized Representative            Date                   Time    __________________________________________ Nurse's Signature    

## 2015-03-06 NOTE — Progress Notes (Signed)
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 The genes tested were ATM, BARD1, BRCA1, BRCA2, BRIP1, CDH1, CHEK2, MRE11A, MUTYH, NBN, NF1, PALB2, PTEN, RAD50, RAD51C, RAD51D, and TP53.   01/30/2015 Breast MRI Left breast: Masses decrease in size from 4 cm to 2.8 cm abuts the pectoralis muscle   02/07/2015 -  Chemotherapy Herceptin maintenance therapy to complete November 2016   02/09/2015 Surgery Left breast lumpectomy: Pathologic complete response    CHIEF COMPLIANT: Follow-up on Herceptin maintenance after surgery on 02/09/2015  INTERVAL HISTORY: Aimee Poole is a 44 year old with above-mentioned history of left breast cancer who underwent neoadjuvant chemotherapy followed by surgery on 02/09/2015. She is recovered very well from surgery but complains of diffuse myalgias especially her legs and feet. She reports that since chemotherapy has been  completed the symptoms are getting worse.  REVIEW OF SYSTEMS:   Constitutional: Denies fevers, chills or abnormal weight loss Eyes: Denies blurriness of vision Ears, nose, mouth, throat, and face: Denies mucositis or sore throat Respiratory: Denies cough, dyspnea or wheezes Cardiovascular: Denies palpitation, chest discomfort or lower extremity swelling Gastrointestinal:  Denies nausea, heartburn or change in bowel habits Skin: Denies abnormal skin rashes Lymphatics: Denies new lymphadenopathy or easy bruising Neurological:Denies numbness, tingling or new weaknesses Behavioral/Psych: Mood is stable, no new changes  All other systems were reviewed with the patient and are negative.  I have reviewed the past medical history, past surgical history, social history and family history with the patient and they are unchanged from previous note.  ALLERGIES:  is allergic to corn-containing products.  MEDICATIONS:  Current Outpatient Prescriptions  Medication Sig Dispense Refill  . Emollient (AQUAPHOR ADVANCED THERAPY) OINT Take 1 application by mouth daily. Hands and feet  0  . lidocaine-prilocaine (EMLA) cream Apply 1 application topically as needed. 30 g 3  . LORazepam (ATIVAN) 0.5 MG tablet Take by mouth daily as needed.   0  . oxyCODONE-acetaminophen (PERCOCET) 10-325 MG per tablet Take 1 tablet by mouth every 6 (six) hours as needed for pain. 20 tablet 0  . diphenoxylate-atropine (LOMOTIL) 2.5-0.025 MG per tablet Take 1 tablet by mouth 4 (four) times daily as needed for diarrhea or loose stools. (Patient not taking: Reported on 02/24/2015) 30 tablet 0   No current facility-administered medications for this visit.   Facility-Administered Medications Ordered in Other Visits  Medication Dose Route Frequency Provider Last Rate  Last Dose  . 0.9 %  sodium chloride infusion   Intravenous Once Nicholas Lose, MD      . heparin lock flush 100 unit/mL  500 Units Intracatheter Once PRN Nicholas Lose, MD       . sodium chloride 0.9 % injection 10 mL  10 mL Intracatheter PRN Nicholas Lose, MD      . trastuzumab (HERCEPTIN) 462 mg in sodium chloride 0.9 % 250 mL chemo infusion  6 mg/kg (Treatment Plan Actual) Intravenous Once Nicholas Lose, MD        PHYSICAL EXAMINATION: ECOG PERFORMANCE STATUS: 1 - Symptomatic but completely ambulatory  Filed Vitals:   03/06/15 1345  BP: 125/68  Pulse: 92  Temp: 97.7 F (36.5 C)  Resp: 18   Filed Weights   03/06/15 1345  Weight: 176 lb 8 oz (80.06 kg)    GENERAL:alert, no distress and comfortable SKIN: skin color, texture, turgor are normal, no rashes or significant lesions EYES: normal, Conjunctiva are pink and non-injected, sclera clear OROPHARYNX:no exudate, no erythema and lips, buccal mucosa, and tongue normal  NECK: supple, thyroid normal size, non-tender, without nodularity LYMPH:  no palpable lymphadenopathy in the cervical, axillary or inguinal LUNGS: clear to auscultation and percussion with normal breathing effort HEART: regular rate & rhythm and no murmurs and no lower extremity edema ABDOMEN:abdomen soft, non-tender and normal bowel sounds Musculoskeletal:no cyanosis of digits and no clubbing  NEURO: alert & oriented x 3 with fluent speech, no focal motor/sensory deficits LABORATORY DATA:  I have reviewed the data as listed   Chemistry      Component Value Date/Time   NA 141 03/06/2015 1327   NA 139 01/24/2015 1120   K 3.6 03/06/2015 1327   K 4.2 01/24/2015 1120   CL 106 01/24/2015 1120   CO2 24 03/06/2015 1327   CO2 28 01/24/2015 1120   BUN 9.6 03/06/2015 1327   BUN 12 01/24/2015 1120   CREATININE 0.7 03/06/2015 1327   CREATININE 0.71 01/24/2015 1120      Component Value Date/Time   CALCIUM 10.5* 03/06/2015 1327   CALCIUM 11.0* 01/24/2015 1120   ALKPHOS 128 03/06/2015 1327   ALKPHOS 91 11/01/2009 1212   AST 31 03/06/2015 1327   AST 22 11/01/2009 1212   ALT 37 03/06/2015 1327   ALT 22 11/01/2009 1212   BILITOT 0.36  03/06/2015 1327   BILITOT 0.6 11/01/2009 1212       Lab Results  Component Value Date   WBC 4.5 03/06/2015   HGB 11.2* 03/06/2015   HCT 33.8* 03/06/2015   MCV 95.9 03/06/2015   PLT 274 03/06/2015   NEUTROABS 2.5 03/06/2015   ASSESSMENT & PLAN:  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 clinical stage ER 7% PR 0% HER-2 positive ratio 3.84 in the reconstructed left breast. Completed 6 cycles of of TCHP  Post neoadjuvant MRI: Decrease in size of the mass from 4 cm to 2.8 cm; Pathology counseling:  pathologic complete response based on lumpectomy March 2016   Current treatment:  1. Herceptin maintenance once every 3 weeks  2. Adjuvant radiation therapy  Myalgias: I discussed with her that these could be steroid withdrawal symptoms. I instructed her to exercise and walk daily. This will slowly go away.  Return to clinic every 3 weeks for Herceptin maintenance every 6 weeks to follow up with me.    No orders of the defined  types were placed in this encounter.   The patient has a good understanding of the overall plan. she agrees with it. She will call with any problems that may develop before her next visit here.   Rulon Eisenmenger, MD

## 2015-03-07 ENCOUNTER — Telehealth: Payer: Self-pay | Admitting: *Deleted

## 2015-03-07 ENCOUNTER — Telehealth: Payer: Self-pay | Admitting: Hematology and Oncology

## 2015-03-07 ENCOUNTER — Encounter: Payer: Self-pay | Admitting: *Deleted

## 2015-03-07 NOTE — Telephone Encounter (Signed)
Left message to confirm appointment in May. Mailed calendar.

## 2015-03-07 NOTE — Progress Notes (Signed)
Rec'd fax from [employer] to have disability forms filled out. Given to provider 03/08/15.

## 2015-03-07 NOTE — Telephone Encounter (Signed)
Per staff message and POF I have scheduled appts. Advised scheduler of appts. JMW  

## 2015-03-07 NOTE — Progress Notes (Signed)
[place] Psychosocial Distress Screening Clinical Social Work  Clinical Social Work was referred by distress screening protocol.  The patient scored a 5 on the Psychosocial Distress Thermometer which indicates moderate distress. Clinical Social Worker contacted patient at home to assess for distress and other psychosocial needs.  Patient stated she was "doing fine" and had no immediate concerns at this time.  CSW reviewed the support team and support services at [facility].  Patient is scheduled for [facility] tomorrow and CSW encouraged patient to contact with needs or concerns.     ONCBCN DISTRESS SCREENING 02/24/2015  Screening Type Change in Status  Distress experienced in past week (1-10) 5  Practical problem type   Emotional problem type   Information Concerns Type   Physical Problem type Pain;Sleep/insomnia  Physician notified of physical symptoms Yes  Referral to clinical social work Yes  Other    Johnnye Lana, MSW, LCSW, OSW-C Clinical Social Worker [place] [phone]

## 2015-03-08 ENCOUNTER — Ambulatory Visit
Admission: RE | Admit: 2015-03-08 | Discharge: 2015-03-08 | Disposition: A | Discharge status: Home / Self Care | Payer: BLUE CROSS/BLUE SHIELD | Source: Ambulatory Visit | Attending: Radiation Oncology | Admitting: Radiation Oncology

## 2015-03-08 DIAGNOSIS — C50412 Malignant neoplasm of upper-outer quadrant of left female breast: Secondary | ICD-10-CM | POA: Insufficient documentation

## 2015-03-08 NOTE — Progress Notes (Signed)
Name: RAYELLE ARMOR   MRN: [id number]  Date:  03/08/2015  DOB: [birth year]  Status:outpatient    DIAGNOSIS: Breast cancer.  CONSENT VERIFIED: yes   SET UP: Patient is setup supine   IMMOBILIZATION:  The following immobilization was used:Custom Moldable Pillow, breast board.   NARRATIVE: Ms. Manfredi was brought to the [facility].  Identity was confirmed.  All relevant records and images related to the planned course of therapy were reviewed.  Then, the patient was positioned in a stable reproducible clinical set-up for radiation therapy.  Wires were placed to delineate the clinical extent of breast tissue. A wire was placed on the scar as well.  CT images were obtained.  An isocenter was placed. Skin markings were placed.  The CT images were loaded into the planning software where the target and avoidance structures were contoured.  The radiation prescription was entered and confirmed. The patient was discharged in stable condition and tolerated simulation well.    TREATMENT PLANNING NOTE:  Treatment planning then occurred. I have requested : MLC's, isodose plan, basic dose calculation  I personally designed and supervised the construction of 5 medically necessary complex treatment devices for the protection of critical normal structures including the lungs and contralateral breast as well as the immobilization device which is necessary for set up certainty.   TREATMENT PLANNING NOTE/3D Simulation Note Treatment planning then occurred. I have requested : MLC's, isodose plan, basic dose calculation  3D simulation was performed.  I personally constructed 6 complex treatment devices in the form of MLCs which will be used for beam modification and to protect critical structures including the heart and lung.  I have requested a dose volume histogram of the heart lung and tumor cavity.

## 2015-03-13 ENCOUNTER — Telehealth: Payer: Self-pay

## 2015-03-13 NOTE — Telephone Encounter (Signed)
Returned Ryan's call of [employer] regarding start and end date of radiation 03/16/15 thru 05/02/15.Left voice message with return number if Thurmond Butts has any further questions to contact me.phone number [phone]. Claim number [id number].

## 2015-03-15 ENCOUNTER — Ambulatory Visit
Admission: RE | Admit: 2015-03-15 | Discharge: 2015-03-15 | Disposition: A | Discharge status: Home / Self Care | Payer: BLUE CROSS/BLUE SHIELD | Source: Ambulatory Visit | Attending: Radiation Oncology | Admitting: Radiation Oncology

## 2015-03-15 DIAGNOSIS — C50412 Malignant neoplasm of upper-outer quadrant of left female breast: Secondary | ICD-10-CM | POA: Diagnosis not present

## 2015-03-16 ENCOUNTER — Ambulatory Visit
Admission: RE | Admit: 2015-03-16 | Discharge: 2015-03-16 | Disposition: A | Discharge status: Home / Self Care | Payer: BLUE CROSS/BLUE SHIELD | Source: Ambulatory Visit | Attending: Radiation Oncology | Admitting: Radiation Oncology

## 2015-03-16 DIAGNOSIS — C50412 Malignant neoplasm of upper-outer quadrant of left female breast: Secondary | ICD-10-CM | POA: Diagnosis not present

## 2015-03-17 ENCOUNTER — Ambulatory Visit
Admission: RE | Admit: 2015-03-17 | Discharge: 2015-03-17 | Disposition: A | Discharge status: Home / Self Care | Payer: BLUE CROSS/BLUE SHIELD | Source: Ambulatory Visit | Attending: Radiation Oncology | Admitting: Radiation Oncology

## 2015-03-17 DIAGNOSIS — C50412 Malignant neoplasm of upper-outer quadrant of left female breast: Secondary | ICD-10-CM | POA: Diagnosis not present

## 2015-03-20 ENCOUNTER — Ambulatory Visit
Admission: RE | Admit: 2015-03-20 | Discharge: 2015-03-20 | Disposition: A | Discharge status: Home / Self Care | Payer: BLUE CROSS/BLUE SHIELD | Source: Ambulatory Visit | Attending: Radiation Oncology | Admitting: Radiation Oncology

## 2015-03-20 DIAGNOSIS — C50412 Malignant neoplasm of upper-outer quadrant of left female breast: Secondary | ICD-10-CM | POA: Diagnosis not present

## 2015-03-21 ENCOUNTER — Ambulatory Visit
Admission: RE | Admit: 2015-03-21 | Discharge: 2015-03-21 | Disposition: A | Discharge status: Home / Self Care | Payer: BLUE CROSS/BLUE SHIELD | Source: Ambulatory Visit | Attending: Radiation Oncology | Admitting: Radiation Oncology

## 2015-03-21 VITALS — BP 126/59 | HR 83 | Temp 98.6°F | Wt 174.2 lb

## 2015-03-21 DIAGNOSIS — C50412 Malignant neoplasm of upper-outer quadrant of left female breast: Secondary | ICD-10-CM

## 2015-03-21 MED ORDER — RADIAPLEXRX EX GEL
Freq: Once | CUTANEOUS | Status: AC
  Administered 2015-03-21: 17:00:00 via TOPICAL

## 2015-03-21 MED ORDER — ALRA NON-METALLIC DEODORANT (RAD-ONC)
1.0000 "application " | Freq: Once | TOPICAL | Status: AC
  Administered 2015-03-21: 1 via TOPICAL

## 2015-03-21 NOTE — Progress Notes (Signed)
Weekly Management Note Current Dose:  7.2 Gy  Projected Dose: 60.4 Gy   Narrative:  The patient presents for routine under treatment assessment.  CBCT/MVCT images/Port film x-rays were reviewed.  The chart was checked. Doing well. No complaints except joint pain.   Physical Findings: Weight: 174 lb 3.2 oz (79.017 kg). Unchanged  Impression:  The patient is tolerating radiation.  Plan:  Continue treatment as planned. Restart claritin.

## 2015-03-21 NOTE — Progress Notes (Signed)
Pt here for patient teaching.  Pt given Radiation and You booklet, skin care instructions, Alra deodorant and Radiaplex gel. Reviewed areas of pertinence such as  . Pt able to give teach back of to pat skin, use unscented/gentle soap and drink plenty of water,apply Radiaplex bid, avoid applying anything to skin within 4 hours of treatment, avoid wearing an under wire bra and to use an electric razor if they must shave. Pt demonstrated understanding, needs reinforcement and verbalizes understanding of information given and will contact nursing with any questions or concerns. Patient will come in prior to treatment tomorrow to review information provided.

## 2015-03-21 NOTE — Addendum Note (Signed)
Encounter addended by: Norm Salt, RN on: 03/21/2015  5:16 PM<BR>     Documentation filed: Inpatient MAR

## 2015-03-21 NOTE — Addendum Note (Signed)
Encounter addended by: Norm Salt, RN on: 03/21/2015  5:36 PM<BR>     Documentation filed: Notes Section, Chief Complaint Section

## 2015-03-22 ENCOUNTER — Ambulatory Visit
Admission: RE | Admit: 2015-03-22 | Discharge: 2015-03-22 | Disposition: A | Discharge status: Home / Self Care | Payer: BLUE CROSS/BLUE SHIELD | Source: Ambulatory Visit | Attending: Radiation Oncology | Admitting: Radiation Oncology

## 2015-03-22 DIAGNOSIS — C50412 Malignant neoplasm of upper-outer quadrant of left female breast: Secondary | ICD-10-CM | POA: Diagnosis not present

## 2015-03-23 ENCOUNTER — Ambulatory Visit
Admission: RE | Admit: 2015-03-23 | Discharge: 2015-03-23 | Disposition: A | Discharge status: Home / Self Care | Payer: BLUE CROSS/BLUE SHIELD | Source: Ambulatory Visit | Attending: Radiation Oncology | Admitting: Radiation Oncology

## 2015-03-23 DIAGNOSIS — C50412 Malignant neoplasm of upper-outer quadrant of left female breast: Secondary | ICD-10-CM | POA: Diagnosis not present

## 2015-03-24 ENCOUNTER — Ambulatory Visit
Admission: RE | Admit: 2015-03-24 | Discharge: 2015-03-24 | Disposition: A | Discharge status: Home / Self Care | Payer: BLUE CROSS/BLUE SHIELD | Source: Ambulatory Visit | Attending: Radiation Oncology | Admitting: Radiation Oncology

## 2015-03-24 DIAGNOSIS — C50412 Malignant neoplasm of upper-outer quadrant of left female breast: Secondary | ICD-10-CM | POA: Diagnosis not present

## 2015-03-27 ENCOUNTER — Ambulatory Visit
Admission: RE | Admit: 2015-03-27 | Discharge: 2015-03-27 | Disposition: A | Discharge status: Home / Self Care | Payer: BLUE CROSS/BLUE SHIELD | Source: Ambulatory Visit | Attending: Radiation Oncology | Admitting: Radiation Oncology

## 2015-03-27 ENCOUNTER — Ambulatory Visit (HOSPITAL_BASED_OUTPATIENT_CLINIC_OR_DEPARTMENT_OTHER): Payer: BLUE CROSS/BLUE SHIELD

## 2015-03-27 VITALS — BP 132/84 | HR 94 | Temp 98.2°F | Resp 18

## 2015-03-27 DIAGNOSIS — Z5112 Encounter for antineoplastic immunotherapy: Secondary | ICD-10-CM | POA: Diagnosis not present

## 2015-03-27 DIAGNOSIS — C50412 Malignant neoplasm of upper-outer quadrant of left female breast: Secondary | ICD-10-CM

## 2015-03-27 MED ORDER — DIPHENHYDRAMINE HCL 25 MG PO CAPS
50.0000 mg | ORAL_CAPSULE | Freq: Once | ORAL | Status: AC
  Administered 2015-03-27: 50 mg via ORAL

## 2015-03-27 MED ORDER — SODIUM CHLORIDE 0.9 % IV SOLN
Freq: Once | INTRAVENOUS | Status: AC
  Administered 2015-03-27: 09:00:00 via INTRAVENOUS

## 2015-03-27 MED ORDER — ACETAMINOPHEN 325 MG PO TABS
ORAL_TABLET | ORAL | Status: AC
  Filled 2015-03-27: qty 2

## 2015-03-27 MED ORDER — DIPHENHYDRAMINE HCL 25 MG PO CAPS
ORAL_CAPSULE | ORAL | Status: AC
  Filled 2015-03-27: qty 2

## 2015-03-27 MED ORDER — ACETAMINOPHEN 325 MG PO TABS
650.0000 mg | ORAL_TABLET | Freq: Once | ORAL | Status: AC
  Administered 2015-03-27: 650 mg via ORAL

## 2015-03-27 MED ORDER — TRASTUZUMAB CHEMO INJECTION 440 MG
6.0000 mg/kg | Freq: Once | INTRAVENOUS | Status: AC
  Administered 2015-03-27: 462 mg via INTRAVENOUS
  Filled 2015-03-27: qty 22

## 2015-03-27 NOTE — Patient Instructions (Signed)
[facility] Discharge Instructions for Patients Receiving Chemotherapy  Today you received the following chemotherapy agents: herceptin  To help prevent nausea and vomiting after your treatment, we encourage you to take your nausea medication.  Take it as often as prescribed.     If you develop nausea and vomiting that is not controlled by your nausea medication, call the clinic. If it is after clinic hours your family physician or the after hours number for the clinic or go to the Emergency Department.   BELOW ARE SYMPTOMS THAT SHOULD BE REPORTED IMMEDIATELY:  *FEVER GREATER THAN 100.5 F  *CHILLS WITH OR WITHOUT FEVER  NAUSEA AND VOMITING THAT IS NOT CONTROLLED WITH YOUR NAUSEA MEDICATION  *UNUSUAL SHORTNESS OF BREATH  *UNUSUAL BRUISING OR BLEEDING  TENDERNESS IN MOUTH AND THROAT WITH OR WITHOUT PRESENCE OF ULCERS  *URINARY PROBLEMS  *BOWEL PROBLEMS  UNUSUAL RASH Items with * indicate a potential emergency and should be followed up as soon as possible.  Feel free to call the clinic you have any questions or concerns. The clinic phone number is [phone].   I have been informed and understand all the instructions given to me. I know to contact the clinic, my physician, or go to the Emergency Department if any problems should occur. I do not have any questions at this time, but understand that I may call the clinic during office hours   should I have any questions or need assistance in obtaining follow up care.    __________________________________________  _____________  __________ Signature of Patient or Authorized Representative            Date                   Time    __________________________________________ Nurse's Signature    

## 2015-03-28 ENCOUNTER — Ambulatory Visit
Admission: RE | Admit: 2015-03-28 | Discharge: 2015-03-28 | Disposition: A | Discharge status: Home / Self Care | Payer: BLUE CROSS/BLUE SHIELD | Source: Ambulatory Visit | Attending: Radiation Oncology | Admitting: Radiation Oncology

## 2015-03-28 ENCOUNTER — Encounter: Payer: Self-pay | Admitting: Radiation Oncology

## 2015-03-28 VITALS — BP 135/78 | HR 97 | Temp 98.0°F | Resp 12 | Ht 61.5 in | Wt 177.5 lb

## 2015-03-28 DIAGNOSIS — C50412 Malignant neoplasm of upper-outer quadrant of left female breast: Secondary | ICD-10-CM

## 2015-03-28 NOTE — Progress Notes (Signed)
Weekly Management Note Current Dose:  16.2 Gy  Projected Dose: 61 Gy   Narrative:  The patient presents for routine under treatment assessment.  CBCT/MVCT images/Port film x-rays were reviewed.  The chart was checked. Some nausea and diarrhea. Headache since this morning. Pain in her legs and arms for 1 week. Not responding to claritin. Taking oxycodone and ativan.   Physical Findings: Weight: 177 lb 8 oz (80.513 kg). Unchanged skin of breast.   Impression:  The patient is tolerating radiation.  Plan:  Continue treatment as planned. Continue radiaplex. Monitor headaches. MRI brain if they continue. Discuss pains with medical oncology.

## 2015-03-28 NOTE — Progress Notes (Addendum)
Aimee Poole has completed 9 fractions to her left breast.  She reports pain due to a headache at an 8/10.  She also has pain in both arms and legs from chemotherapy.  She is taking 1 percocet daily and needs a refill. She had herceptin infusion yesterday.  She reports nausea and is taking lorazepam.  She is also requesting a refill on this as well.  She is also having diarrhea with 2-3 stools per day.  She has Imodium but has not taken it yet. She reports fatigue.  The skin on her left breast is intact with hyperpigmentation under the breast.  She is using radiaplex.  BP 135/78 mmHg  Pulse 97  Temp(Src) 98 F (36.7 C) (Oral)  Resp 12  Ht 5' 1.5" (1.562 m)  Wt 177 lb 8 oz (80.513 kg)  BMI 33.00 kg/m2

## 2015-03-29 ENCOUNTER — Ambulatory Visit
Admission: RE | Admit: 2015-03-29 | Discharge: 2015-03-29 | Disposition: A | Discharge status: Home / Self Care | Payer: BLUE CROSS/BLUE SHIELD | Source: Ambulatory Visit | Attending: Radiation Oncology | Admitting: Radiation Oncology

## 2015-03-29 DIAGNOSIS — C50412 Malignant neoplasm of upper-outer quadrant of left female breast: Secondary | ICD-10-CM | POA: Diagnosis not present

## 2015-03-30 ENCOUNTER — Ambulatory Visit
Admission: RE | Admit: 2015-03-30 | Discharge: 2015-03-30 | Disposition: A | Discharge status: Home / Self Care | Payer: BLUE CROSS/BLUE SHIELD | Source: Ambulatory Visit | Attending: Radiation Oncology | Admitting: Radiation Oncology

## 2015-03-30 DIAGNOSIS — C50412 Malignant neoplasm of upper-outer quadrant of left female breast: Secondary | ICD-10-CM | POA: Diagnosis not present

## 2015-03-31 ENCOUNTER — Ambulatory Visit
Admission: RE | Admit: 2015-03-31 | Discharge: 2015-03-31 | Disposition: A | Discharge status: Home / Self Care | Payer: BLUE CROSS/BLUE SHIELD | Source: Ambulatory Visit | Attending: Radiation Oncology | Admitting: Radiation Oncology

## 2015-03-31 DIAGNOSIS — C50412 Malignant neoplasm of upper-outer quadrant of left female breast: Secondary | ICD-10-CM | POA: Diagnosis not present

## 2015-04-03 ENCOUNTER — Ambulatory Visit
Admission: RE | Admit: 2015-04-03 | Discharge: 2015-04-03 | Disposition: A | Discharge status: Home / Self Care | Payer: BLUE CROSS/BLUE SHIELD | Source: Ambulatory Visit | Attending: Radiation Oncology | Admitting: Radiation Oncology

## 2015-04-03 DIAGNOSIS — C50412 Malignant neoplasm of upper-outer quadrant of left female breast: Secondary | ICD-10-CM | POA: Diagnosis not present

## 2015-04-04 ENCOUNTER — Ambulatory Visit
Admission: RE | Admit: 2015-04-04 | Discharge: 2015-04-04 | Disposition: A | Discharge status: Home / Self Care | Payer: BLUE CROSS/BLUE SHIELD | Source: Ambulatory Visit | Attending: Radiation Oncology | Admitting: Radiation Oncology

## 2015-04-04 VITALS — BP 137/77 | HR 88 | Temp 98.3°F | Wt 173.7 lb

## 2015-04-04 DIAGNOSIS — C50412 Malignant neoplasm of upper-outer quadrant of left female breast: Secondary | ICD-10-CM

## 2015-04-04 NOTE — Progress Notes (Signed)
Weekly Management Note Current Dose: 25.2  Gy  Projected Dose: 61 Gy   Narrative:  The patient presents for routine under treatment assessment.  CBCT/MVCT images/Port film x-rays were reviewed.  The chart was checked. Doing well. More irritated in the inframammary fold.  Physical Findings: Weight: 173 lb 11.2 oz (78.79 kg). Darker skin and inframammary fold.  Impression:  The patient is tolerating radiation.  Plan:  Continue treatment as planned. Continue radiaplex.

## 2015-04-05 ENCOUNTER — Ambulatory Visit
Admission: RE | Admit: 2015-04-05 | Discharge: 2015-04-05 | Disposition: A | Discharge status: Home / Self Care | Payer: BLUE CROSS/BLUE SHIELD | Source: Ambulatory Visit | Attending: Radiation Oncology | Admitting: Radiation Oncology

## 2015-04-05 DIAGNOSIS — C50412 Malignant neoplasm of upper-outer quadrant of left female breast: Secondary | ICD-10-CM | POA: Diagnosis not present

## 2015-04-06 ENCOUNTER — Ambulatory Visit
Admission: RE | Admit: 2015-04-06 | Discharge: 2015-04-06 | Disposition: A | Discharge status: Home / Self Care | Payer: BLUE CROSS/BLUE SHIELD | Source: Ambulatory Visit | Attending: Radiation Oncology | Admitting: Radiation Oncology

## 2015-04-06 DIAGNOSIS — C50412 Malignant neoplasm of upper-outer quadrant of left female breast: Secondary | ICD-10-CM | POA: Diagnosis not present

## 2015-04-07 ENCOUNTER — Ambulatory Visit
Admission: RE | Admit: 2015-04-07 | Discharge: 2015-04-07 | Disposition: A | Discharge status: Home / Self Care | Payer: BLUE CROSS/BLUE SHIELD | Source: Ambulatory Visit | Attending: Radiation Oncology | Admitting: Radiation Oncology

## 2015-04-07 DIAGNOSIS — C50412 Malignant neoplasm of upper-outer quadrant of left female breast: Secondary | ICD-10-CM | POA: Diagnosis not present

## 2015-04-10 ENCOUNTER — Ambulatory Visit
Admission: RE | Admit: 2015-04-10 | Discharge: 2015-04-10 | Disposition: A | Discharge status: Home / Self Care | Payer: BLUE CROSS/BLUE SHIELD | Source: Ambulatory Visit | Attending: Radiation Oncology | Admitting: Radiation Oncology

## 2015-04-10 DIAGNOSIS — C50412 Malignant neoplasm of upper-outer quadrant of left female breast: Secondary | ICD-10-CM | POA: Diagnosis not present

## 2015-04-11 ENCOUNTER — Ambulatory Visit
Admission: RE | Admit: 2015-04-11 | Payer: BLUE CROSS/BLUE SHIELD | Source: Ambulatory Visit | Admitting: Radiation Oncology

## 2015-04-11 ENCOUNTER — Ambulatory Visit
Admission: RE | Admit: 2015-04-11 | Discharge: 2015-04-11 | Disposition: A | Discharge status: Home / Self Care | Payer: BLUE CROSS/BLUE SHIELD | Source: Ambulatory Visit | Attending: Radiation Oncology | Admitting: Radiation Oncology

## 2015-04-11 VITALS — BP 128/66 | HR 80 | Temp 98.4°F | Wt 174.6 lb

## 2015-04-11 DIAGNOSIS — C50412 Malignant neoplasm of upper-outer quadrant of left female breast: Secondary | ICD-10-CM

## 2015-04-11 NOTE — Progress Notes (Signed)
Weekly assessment of radiation to left breast.Day 19 of 33.Has small area of desquamation of mammary fold.Apply neosporin to this area and continue radiaplex over breast.

## 2015-04-11 NOTE — Progress Notes (Signed)
Weekly Management Note Current Dose:  34.2 Gy  Projected Dose: 61 Gy   Narrative:  The patient presents for routine under treatment assessment.  CBCT/MVCT images/Port film x-rays were reviewed.  The chart was checked. Doing well. Small area of irritation under breast.   Physical Findings: Weight: 174 lb 9.6 oz (79.198 kg). Early moist desquamation in inframammary fold.  Impression:  The patient is tolerating radiation.  Plan:  Continue treatment as planned. Add neosporin to inframammary fold. Continue radiaplex otherwise.

## 2015-04-12 ENCOUNTER — Ambulatory Visit
Admission: RE | Admit: 2015-04-12 | Discharge: 2015-04-12 | Disposition: A | Discharge status: Home / Self Care | Payer: BLUE CROSS/BLUE SHIELD | Source: Ambulatory Visit | Attending: Radiation Oncology | Admitting: Radiation Oncology

## 2015-04-12 DIAGNOSIS — C50412 Malignant neoplasm of upper-outer quadrant of left female breast: Secondary | ICD-10-CM | POA: Diagnosis not present

## 2015-04-13 ENCOUNTER — Ambulatory Visit
Admission: RE | Admit: 2015-04-13 | Discharge: 2015-04-13 | Disposition: A | Discharge status: Home / Self Care | Payer: BLUE CROSS/BLUE SHIELD | Source: Ambulatory Visit | Attending: Radiation Oncology | Admitting: Radiation Oncology

## 2015-04-13 DIAGNOSIS — C50412 Malignant neoplasm of upper-outer quadrant of left female breast: Secondary | ICD-10-CM | POA: Diagnosis not present

## 2015-04-14 ENCOUNTER — Ambulatory Visit
Admission: RE | Admit: 2015-04-14 | Discharge: 2015-04-14 | Disposition: A | Discharge status: Home / Self Care | Payer: BLUE CROSS/BLUE SHIELD | Source: Ambulatory Visit | Attending: Radiation Oncology | Admitting: Radiation Oncology

## 2015-04-14 DIAGNOSIS — C50412 Malignant neoplasm of upper-outer quadrant of left female breast: Secondary | ICD-10-CM | POA: Diagnosis not present

## 2015-04-16 NOTE — Assessment & Plan Note (Signed)
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 clinical stage ER 7% PR 0% HER-2 positive ratio 3.84 in the reconstructed left breast. Completed 6 cycles of of TCHP  Post neoadjuvant MRI: Decrease in size of the mass from 4 cm to 2.8 cm; Pathology counseling: pathologic complete response based on lumpectomy March 2016   Current treatment:  1. Herceptin maintenance once every 3 weeks  2. Adjuvant radiation therapy on going 3. Once she completes XRT, will begin adjuvant Anti-estrogen therapy.  Myalgias:  Return to clinic every 3 weeks for Herceptin maintenance every 6 weeks to follow up with me.

## 2015-04-17 ENCOUNTER — Ambulatory Visit (HOSPITAL_BASED_OUTPATIENT_CLINIC_OR_DEPARTMENT_OTHER): Payer: BLUE CROSS/BLUE SHIELD | Admitting: Hematology and Oncology

## 2015-04-17 ENCOUNTER — Encounter: Payer: Self-pay | Admitting: Radiation Oncology

## 2015-04-17 ENCOUNTER — Ambulatory Visit
Admission: RE | Admit: 2015-04-17 | Discharge: 2015-04-17 | Disposition: A | Discharge status: Home / Self Care | Payer: BLUE CROSS/BLUE SHIELD | Source: Ambulatory Visit | Attending: Radiation Oncology | Admitting: Radiation Oncology

## 2015-04-17 ENCOUNTER — Ambulatory Visit (HOSPITAL_BASED_OUTPATIENT_CLINIC_OR_DEPARTMENT_OTHER): Payer: BLUE CROSS/BLUE SHIELD

## 2015-04-17 VITALS — BP 129/79 | HR 89 | Temp 98.1°F | Resp 18 | Ht 61.5 in | Wt 175.5 lb

## 2015-04-17 DIAGNOSIS — C50412 Malignant neoplasm of upper-outer quadrant of left female breast: Secondary | ICD-10-CM | POA: Diagnosis not present

## 2015-04-17 DIAGNOSIS — M791 Myalgia: Secondary | ICD-10-CM

## 2015-04-17 DIAGNOSIS — Z5112 Encounter for antineoplastic immunotherapy: Secondary | ICD-10-CM | POA: Diagnosis not present

## 2015-04-17 DIAGNOSIS — R238 Other skin changes: Secondary | ICD-10-CM | POA: Diagnosis not present

## 2015-04-17 MED ORDER — ACETAMINOPHEN 325 MG PO TABS
ORAL_TABLET | ORAL | Status: AC
Start: 2015-04-17 — End: 2015-04-17
  Filled 2015-04-17: qty 2

## 2015-04-17 MED ORDER — SODIUM CHLORIDE 0.9 % IJ SOLN
10.0000 mL | INTRAMUSCULAR | Status: DC | PRN
  Administered 2015-04-17: 10 mL
  Filled 2015-04-17: qty 10

## 2015-04-17 MED ORDER — TRASTUZUMAB CHEMO INJECTION 440 MG
6.0000 mg/kg | Freq: Once | INTRAVENOUS | Status: AC
  Administered 2015-04-17: 462 mg via INTRAVENOUS
  Filled 2015-04-17: qty 22

## 2015-04-17 MED ORDER — ACETAMINOPHEN 325 MG PO TABS
650.0000 mg | ORAL_TABLET | Freq: Once | ORAL | Status: AC
  Administered 2015-04-17: 650 mg via ORAL

## 2015-04-17 MED ORDER — DIPHENHYDRAMINE HCL 25 MG PO CAPS
50.0000 mg | ORAL_CAPSULE | Freq: Once | ORAL | Status: AC
  Administered 2015-04-17: 50 mg via ORAL

## 2015-04-17 MED ORDER — HEPARIN SOD (PORK) LOCK FLUSH 100 UNIT/ML IV SOLN
500.0000 [IU] | Freq: Once | INTRAVENOUS | Status: AC | PRN
  Administered 2015-04-17: 500 [IU]
  Filled 2015-04-17: qty 5

## 2015-04-17 MED ORDER — DIPHENHYDRAMINE HCL 25 MG PO CAPS
ORAL_CAPSULE | ORAL | Status: AC
  Filled 2015-04-17: qty 2

## 2015-04-17 MED ORDER — SODIUM CHLORIDE 0.9 % IV SOLN
Freq: Once | INTRAVENOUS | Status: AC
  Administered 2015-04-17: 16:00:00 via INTRAVENOUS

## 2015-04-17 MED ORDER — SODIUM CHLORIDE 0.9 % IJ SOLN
10.0000 mL | INTRAMUSCULAR | Status: DC | PRN
  Administered 2015-04-17: 10 mL via INTRAVENOUS
  Filled 2015-04-17: qty 10

## 2015-04-17 NOTE — Patient Instructions (Addendum)
Implanted [facility] Guide An implanted port is a type of central line that is placed under the skin. Central lines are used to provide IV access when treatment or nutrition needs to be given through a person's veins. Implanted ports are used for long-term IV access. An implanted port may be placed because:  1. You need IV medicine that would be irritating to the small veins in your hands or arms.  2. You need long-term IV medicines, such as antibiotics.  3. You need IV nutrition for a long period.  4. You need frequent blood draws for lab tests.  5. You need dialysis.  Implanted ports are usually placed in the chest area, but they can also be placed in the upper arm, the abdomen, or the leg. An implanted port has two main parts:  1. Reservoir. The reservoir is round and will appear as a small, raised area under your skin. The reservoir is the part where a needle is inserted to give medicines or draw blood.  2. Catheter. The catheter is a thin, flexible tube that extends from the reservoir. The catheter is placed into a large vein. Medicine that is inserted into the reservoir goes into the catheter and then into the vein.  HOW WILL I CARE FOR MY INCISION SITE? Do not get the incision site wet. Bathe or shower as directed by your health care provider.  HOW IS MY PORT ACCESSED? Special steps must be taken to access the port:   Before the port is accessed, a numbing cream can be placed on the skin. This helps numb the skin over the port site.   Your health care provider uses a sterile technique to access the port.  Your health care provider must put on a mask and sterile gloves.  The skin over your port is cleaned carefully with an antiseptic and allowed to dry.  The port is gently pinched between sterile gloves, and a needle is inserted into the port.  Only "non-coring" port needles should be used to access the port. Once the port is accessed, a blood return should be checked. This  helps ensure that the port is in the vein and is not clogged.   If your port needs to remain accessed for a constant infusion, a clear (transparent) bandage will be placed over the needle site. The bandage and needle will need to be changed every week, or as directed by your health care provider.   Keep the bandage covering the needle clean and dry. Do not get it wet. Follow your health care provider's instructions on how to take a shower or bath while the port is accessed.   If your port does not need to stay accessed, no bandage is needed over the port.  WHAT IS FLUSHING? Flushing helps keep the port from getting clogged. Follow your health care provider's instructions on how and when to flush the port. Ports are usually flushed with saline solution or a medicine called heparin. The need for flushing will depend on how the port is used.   If the port is used for intermittent medicines or blood draws, the port will need to be flushed:   After medicines have been given.   After blood has been drawn.   As part of routine maintenance.   If a constant infusion is running, the port may not need to be flushed.  HOW LONG WILL MY PORT STAY IMPLANTED? The port can stay in for as long as your health care  provider thinks it is needed. When it is time for the port to come out, surgery will be done to remove it. The procedure is similar to the one performed when the port was put in.  WHEN SHOULD I SEEK IMMEDIATE MEDICAL CARE? When you have an implanted port, you should seek immediate medical care if:   You notice a bad smell coming from the incision site.   You have swelling, redness, or drainage at the incision site.   You have more swelling or pain at the port site or the surrounding area.   You have a fever that is not controlled with medicine. Document Released: 11/11/2005 Document Revised: 09/01/2013 Document Reviewed: 07/19/2013 [facility] Patient Information 2015 [place], [place].  This information is not intended to replace advice given to you by your health care provider. Make sure you discuss any questions you have with your health care provider. [facility] Discharge Instructions for Patients Receiving Chemotherapy  Today you received the following chemotherapy agents: herceptin   To help prevent nausea and vomiting after your treatment, we encourage you to take your nausea medication.  Take it as often as prescribed.     If you develop nausea and vomiting that is not controlled by your nausea medication, call the clinic. If it is after clinic hours your family physician or the after hours number for the clinic or go to the Emergency Department.   BELOW ARE SYMPTOMS THAT SHOULD BE REPORTED IMMEDIATELY:  *FEVER GREATER THAN 100.5 F  *CHILLS WITH OR WITHOUT FEVER  NAUSEA AND VOMITING THAT IS NOT CONTROLLED WITH YOUR NAUSEA MEDICATION  *UNUSUAL SHORTNESS OF BREATH  *UNUSUAL BRUISING OR BLEEDING  TENDERNESS IN MOUTH AND THROAT WITH OR WITHOUT PRESENCE OF ULCERS  *URINARY PROBLEMS  *BOWEL PROBLEMS  UNUSUAL RASH Items with * indicate a potential emergency and should be followed up as soon as possible.  Feel free to call the clinic you have any questions or concerns. The clinic phone number is [phone].   I have been informed and understand all the instructions given to me. I know to contact the clinic, my physician, or go to the Emergency Department if any problems should occur. I do not have any questions at this time, but understand that I may call the clinic during office hours   should I have any questions or need assistance in obtaining follow up care.    __________________________________________  _____________  __________ Signature of Patient or Authorized Representative            Date                   Time    __________________________________________ Nurse's Signature

## 2015-04-17 NOTE — Addendum Note (Signed)
Addended by: Prentiss Bells on: 04/17/2015 02:54 PM   Modules accepted: Medications

## 2015-04-17 NOTE — Progress Notes (Signed)
Forwarded disability paperwork for patient to RN for processing

## 2015-04-17 NOTE — Progress Notes (Signed)
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 The genes tested were ATM, BARD1, BRCA1, BRCA2, BRIP1, CDH1, CHEK2, MRE11A, MUTYH, NBN, NF1, PALB2, PTEN, RAD50, RAD51C, RAD51D, and TP53.   01/30/2015 Breast MRI Left breast: Masses decrease in size from 4 cm to 2.8 cm abuts the pectoralis muscle   02/07/2015 -  Chemotherapy Herceptin maintenance therapy to complete November 2016   02/09/2015 Surgery Left breast lumpectomy: Pathologic complete response   03/13/2015 -  Radiation Therapy Adjuvant radiation therapy    CHIEF COMPLIANT: follow-up on radiation therapy for Herceptin  INTERVAL HISTORY: Aimee Poole is a 44 year old with above-mentioned history of pathological complete response to neoadjuvant chemotherapy. She is currently on adjuvant radiation therapy and is complaining that she has soreness underneath the breasts related to radiation  therapy. Her skin has discoloration.she reports having difficulty with sleeping. She complains of myalgias.  REVIEW OF SYSTEMS:   Constitutional: Denies fevers, chills or abnormal weight loss Eyes: Denies blurriness of vision Ears, nose, mouth, throat, and face: Denies mucositis or sore throat Respiratory: Denies cough, dyspnea or wheezes Cardiovascular: Denies palpitation, chest discomfort or lower extremity swelling Gastrointestinal:  Denies nausea, heartburn or change in bowel habits Skin: Denies abnormal skin rashes Lymphatics: Denies new lymphadenopathy or easy bruising Neurological:Denies numbness, tingling or new weaknesses Behavioral/Psych: Mood is stable, no new changes  Breast: skin changes underneath the left breast related to radiation therapy. All other systems were reviewed with the patient and are negative.  I have reviewed the past medical history, past surgical history, social history and family history with the patient and they are unchanged from previous note.  ALLERGIES:  is allergic to corn-containing products.  MEDICATIONS:  Current Outpatient Prescriptions  Medication Sig Dispense Refill  . diphenoxylate-atropine (LOMOTIL) 2.5-0.025 MG per tablet Take 1 tablet by mouth 4 (four) times daily as needed for diarrhea or loose stools. (Patient not taking: Reported on 02/24/2015) 30 tablet 0  . Emollient (AQUAPHOR ADVANCED THERAPY) OINT Take 1 application by mouth daily. Hands and feet  0  . lidocaine-prilocaine (EMLA) cream Apply 1 application topically as needed. 30 g 3  . loratadine (CLARITIN) 10 MG tablet Take 10 mg by mouth daily.    Marland Kitchen LORazepam (ATIVAN) 0.5 MG tablet Take by mouth daily as needed.   0  . oxyCODONE-acetaminophen (PERCOCET) 10-325 MG per tablet Take 1 tablet by mouth every 6 (six) hours  as needed for pain. 20 tablet 0   No current facility-administered medications for this visit.    PHYSICAL EXAMINATION: ECOG PERFORMANCE STATUS: 1 - Symptomatic but  completely ambulatory  Filed Vitals:   04/17/15 1415  BP: 129/79  Pulse: 89  Temp: 98.1 F (36.7 C)  Resp: 18   Filed Weights   04/17/15 1415  Weight: 175 lb 8 oz (79.606 kg)    GENERAL:alert, no distress and comfortable SKIN: skin color, texture, turgor are normal, no rashes or significant lesions EYES: normal, Conjunctiva are pink and non-injected, sclera clear OROPHARYNX:no exudate, no erythema and lips, buccal mucosa, and tongue normal  NECK: supple, thyroid normal size, non-tender, without nodularity LYMPH:  no palpable lymphadenopathy in the cervical, axillary or inguinal LUNGS: clear to auscultation and percussion with normal breathing effort HEART: regular rate & rhythm and no murmurs and no lower extremity edema ABDOMEN:abdomen soft, non-tender and normal bowel sounds Musculoskeletal:no cyanosis of digits and no clubbing  NEURO: alert & oriented x 3 with fluent speech, no focal motor/sensory deficits  LABORATORY DATA:  I have reviewed the data as listed   Chemistry      Component Value Date/Time   NA 141 03/06/2015 1327   NA 139 01/24/2015 1120   K 3.6 03/06/2015 1327   K 4.2 01/24/2015 1120   CL 106 01/24/2015 1120   CO2 24 03/06/2015 1327   CO2 28 01/24/2015 1120   BUN 9.6 03/06/2015 1327   BUN 12 01/24/2015 1120   CREATININE 0.7 03/06/2015 1327   CREATININE 0.71 01/24/2015 1120      Component Value Date/Time   CALCIUM 10.5* 03/06/2015 1327   CALCIUM 11.0* 01/24/2015 1120   ALKPHOS 128 03/06/2015 1327   ALKPHOS 91 11/01/2009 1212   AST 31 03/06/2015 1327   AST 22 11/01/2009 1212   ALT 37 03/06/2015 1327   ALT 22 11/01/2009 1212   BILITOT 0.36 03/06/2015 1327   BILITOT 0.6 11/01/2009 1212       Lab Results  Component Value Date   WBC 4.5 03/06/2015   HGB 11.2* 03/06/2015   HCT 33.8* 03/06/2015   MCV 95.9 03/06/2015   PLT 274 03/06/2015   NEUTROABS 2.5 03/06/2015    ASSESSMENT & PLAN:  Breast cancer of upper-outer quadrant of left female  breast 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 clinical stage ER 7% PR 0% HER-2 positive ratio 3.84 in the reconstructed left breast. Completed 6 cycles of of TCHP  Post neoadjuvant MRI: Decrease in size of the mass from 4 cm to 2.8 cm; Pathology counseling: pathologic complete response based on lumpectomy March 2016   Current treatment:  1. Herceptin maintenance once every 3 weeks  2. Adjuvant radiation therapy on going 3. Once she completes XRT, will begin adjuvant Anti-estrogen therapy.  Myalgias: I discussed with her that exercise will help the muscle aches and pains. Skin changes due to radiation: I instructed her to discuss with radiation oncology and follow their instructions. She reports that she has trouble sleeping because of the pain in the breasts.  Return to clinic every 3 weeks for Herceptin maintenance every 6 weeks to follow up with me at which point we will start her on antiestrogen therapy with tamoxifen 20 mg daily 10 years.    Orders Placed This Encounter  Procedures  . Echocardiogram    Evaluate EF of heart on Herceptin    Standing Status: Future     Number of Occurrences:  Standing Expiration Date: 07/17/2016    Order Specific Question:  Where should this test be performed    Answer:  [facility])    Order Specific Question:  Complete or Limited study?    Answer:  Complete    Order Specific Question:  With Image Enhancing Agent or without Image Enhancing Agent?    Answer:  With Image Enhancing Agent    Order Specific Question:  Reason for exam-Echo    Answer:  Other - See Comments Section   The patient has a good understanding of the overall plan. she agrees with it. she will call with any problems that may develop before the next visit here.   Rulon Eisenmenger, MD

## 2015-04-18 ENCOUNTER — Telehealth: Payer: Self-pay | Admitting: Hematology and Oncology

## 2015-04-18 ENCOUNTER — Ambulatory Visit
Admission: RE | Admit: 2015-04-18 | Payer: BLUE CROSS/BLUE SHIELD | Source: Ambulatory Visit | Admitting: Radiation Oncology

## 2015-04-18 ENCOUNTER — Ambulatory Visit
Admission: RE | Admit: 2015-04-18 | Discharge: 2015-04-18 | Disposition: A | Discharge status: Home / Self Care | Payer: BLUE CROSS/BLUE SHIELD | Source: Ambulatory Visit | Attending: Radiation Oncology | Admitting: Radiation Oncology

## 2015-04-18 VITALS — BP 130/63 | HR 80 | Wt 176.2 lb

## 2015-04-18 DIAGNOSIS — C50412 Malignant neoplasm of upper-outer quadrant of left female breast: Secondary | ICD-10-CM | POA: Diagnosis not present

## 2015-04-18 NOTE — Telephone Encounter (Signed)
Called today to schedule echo and noticed someone had placed a note on her order that they had left her a message,i placed her precert numbers in the edit notes   Aimee Poole

## 2015-04-18 NOTE — Progress Notes (Signed)
Weekly Management Note Current Dose: 59  Gy  Projected Dose: 61 Gy   Narrative:  The patient presents for routine under treatment assessment.  CBCT/MVCT images/Port film x-rays were reviewed.  The chart was checked. Doing well. Some fatigue. Going back to work after RT.   Physical Findings: Weight: 176 lb 3.2 oz (79.924 kg). Moist desquamation in the inframammary fold. Hyperpigmentation over the breast and in the axilla.   Impression:  The patient is tolerating radiation.  Plan:  Continue treatment as planned. Continue RT.

## 2015-04-19 ENCOUNTER — Ambulatory Visit
Admission: RE | Admit: 2015-04-19 | Discharge: 2015-04-19 | Disposition: A | Discharge status: Home / Self Care | Payer: BLUE CROSS/BLUE SHIELD | Source: Ambulatory Visit | Attending: Radiation Oncology | Admitting: Radiation Oncology

## 2015-04-19 ENCOUNTER — Telehealth: Payer: Self-pay | Admitting: Hematology and Oncology

## 2015-04-19 DIAGNOSIS — C50412 Malignant neoplasm of upper-outer quadrant of left female breast: Secondary | ICD-10-CM | POA: Diagnosis not present

## 2015-04-19 NOTE — Telephone Encounter (Signed)
Called and left a message with echo appointment °

## 2015-04-20 ENCOUNTER — Ambulatory Visit
Admission: RE | Admit: 2015-04-20 | Discharge: 2015-04-20 | Disposition: A | Discharge status: Home / Self Care | Payer: BLUE CROSS/BLUE SHIELD | Source: Ambulatory Visit | Attending: Radiation Oncology | Admitting: Radiation Oncology

## 2015-04-20 DIAGNOSIS — C50412 Malignant neoplasm of upper-outer quadrant of left female breast: Secondary | ICD-10-CM | POA: Diagnosis not present

## 2015-04-21 ENCOUNTER — Ambulatory Visit
Admission: RE | Admit: 2015-04-21 | Discharge: 2015-04-21 | Disposition: A | Discharge status: Home / Self Care | Payer: BLUE CROSS/BLUE SHIELD | Source: Ambulatory Visit | Attending: Radiation Oncology | Admitting: Radiation Oncology

## 2015-04-21 DIAGNOSIS — C50412 Malignant neoplasm of upper-outer quadrant of left female breast: Secondary | ICD-10-CM | POA: Diagnosis not present

## 2015-04-25 ENCOUNTER — Ambulatory Visit
Admission: RE | Admit: 2015-04-25 | Discharge: 2015-04-25 | Disposition: A | Discharge status: Home / Self Care | Payer: BLUE CROSS/BLUE SHIELD | Source: Ambulatory Visit | Attending: Radiation Oncology | Admitting: Radiation Oncology

## 2015-04-25 DIAGNOSIS — C50412 Malignant neoplasm of upper-outer quadrant of left female breast: Secondary | ICD-10-CM | POA: Diagnosis not present

## 2015-04-25 NOTE — Progress Notes (Signed)
Weekly assessment of radiation to left breast.Completed 28 of 33.Dry desquamation already healing.Continue biafine.

## 2015-04-25 NOTE — Progress Notes (Signed)
Weekly Management Note Current Dose:  51 Gy  Projected Dose: 61 Gy   Narrative:  The patient presents for routine under treatment assessment.  CBCT/MVCT images/Port film x-rays were reviewed.  The chart was checked. Doing well. Some discomfort in her axilla.   Physical Findings: Darkness in axilla with some dry desquamation.   Impression:  The patient is tolerating radiation.  Plan:  Continue treatment as planned. Continue radiaplex.

## 2015-04-26 ENCOUNTER — Encounter: Payer: Self-pay | Admitting: Radiation Oncology

## 2015-04-26 ENCOUNTER — Ambulatory Visit
Admission: RE | Admit: 2015-04-26 | Discharge: 2015-04-26 | Disposition: A | Discharge status: Home / Self Care | Payer: BLUE CROSS/BLUE SHIELD | Source: Ambulatory Visit | Attending: Radiation Oncology | Admitting: Radiation Oncology

## 2015-04-26 DIAGNOSIS — C50412 Malignant neoplasm of upper-outer quadrant of left female breast: Secondary | ICD-10-CM | POA: Diagnosis not present

## 2015-04-26 NOTE — Progress Notes (Signed)
Faxed completed disability form to [place] [employer]#[phone]

## 2015-04-27 ENCOUNTER — Ambulatory Visit
Admission: RE | Admit: 2015-04-27 | Discharge: 2015-04-27 | Disposition: A | Discharge status: Home / Self Care | Payer: BLUE CROSS/BLUE SHIELD | Source: Ambulatory Visit | Attending: Radiation Oncology | Admitting: Radiation Oncology

## 2015-04-27 DIAGNOSIS — C50412 Malignant neoplasm of upper-outer quadrant of left female breast: Secondary | ICD-10-CM | POA: Diagnosis not present

## 2015-04-28 ENCOUNTER — Ambulatory Visit
Admission: RE | Admit: 2015-04-28 | Discharge: 2015-04-28 | Disposition: A | Discharge status: Home / Self Care | Payer: BLUE CROSS/BLUE SHIELD | Source: Ambulatory Visit | Attending: Radiation Oncology | Admitting: Radiation Oncology

## 2015-04-28 DIAGNOSIS — C50412 Malignant neoplasm of upper-outer quadrant of left female breast: Secondary | ICD-10-CM | POA: Diagnosis not present

## 2015-05-01 ENCOUNTER — Ambulatory Visit
Admission: RE | Admit: 2015-05-01 | Discharge: 2015-05-01 | Disposition: A | Discharge status: Home / Self Care | Payer: BLUE CROSS/BLUE SHIELD | Source: Ambulatory Visit | Attending: Radiation Oncology | Admitting: Radiation Oncology

## 2015-05-01 DIAGNOSIS — C50412 Malignant neoplasm of upper-outer quadrant of left female breast: Secondary | ICD-10-CM | POA: Diagnosis not present

## 2015-05-02 ENCOUNTER — Ambulatory Visit (HOSPITAL_COMMUNITY)
Admission: RE | Admit: 2015-05-02 | Discharge: 2015-05-02 | Disposition: A | Discharge status: Home / Self Care | Payer: BLUE CROSS/BLUE SHIELD | Source: Ambulatory Visit | Attending: Hematology and Oncology | Admitting: Hematology and Oncology

## 2015-05-02 ENCOUNTER — Ambulatory Visit
Admission: RE | Admit: 2015-05-02 | Discharge: 2015-05-02 | Disposition: A | Discharge status: Home / Self Care | Payer: BLUE CROSS/BLUE SHIELD | Source: Ambulatory Visit | Attending: Radiation Oncology | Admitting: Radiation Oncology

## 2015-05-02 ENCOUNTER — Encounter: Payer: Self-pay | Admitting: Radiation Oncology

## 2015-05-02 VITALS — BP 135/75 | HR 86 | Temp 98.3°F | Wt 175.4 lb

## 2015-05-02 DIAGNOSIS — C50412 Malignant neoplasm of upper-outer quadrant of left female breast: Secondary | ICD-10-CM | POA: Diagnosis present

## 2015-05-02 NOTE — Progress Notes (Signed)
  Radiation Oncology         [phone] ________________________________  Name: Aimee Poole MRN: [id number]  Date: 05/02/2015  DOB: August 22, 1971  End of Treatment Note  Diagnosis:   Breast cancer of upper-outer quadrant of left female breast (recurrent)   Staging form: Breast, AJCC 7th Edition     Clinical: Stage IIA (T2, N0, cM0) - Signed by Rulon Eisenmenger, MD on 09/20/2014     Pathologic: Stage 0 (Tis, N0, cM0) - Signed by Rolm Bookbinder, MD on 03/13/2012     Indication for treatment:  Curative      Radiation treatment dates:   4/20-05/02/2015 Site/dose:   Left breast/ 45 Gy at 1.8 Gy per fraction x 25 fractions.  Left supraclavicular fossa and axilla/ 45 Gy at 1.8 Gy per fraction x 25 fractions Left breast boost/ 16 Gy at 2 Gy per fraction x 8 fractions  Beams/energy:  Opposed tangents with reduced fields / 6 MV photons RAO/LPO 6 MV photons Four field boost with 15 MV photons  Narrative: The patient tolerated radiation treatment relatively well.   She had skin darkening and dry desquamation but tolerated this well.   Plan: The patient has completed radiation treatment. The patient will return to radiation oncology clinic for routine followup in one month. I advised them to call or return sooner if they have any questions or concerns related to their recovery or treatment.  ------------------------------------------------  Thea Silversmith, MD

## 2015-05-02 NOTE — Progress Notes (Signed)
Echocardiogram 2D Echocardiogram has been performed.  Aimee Poole 05/02/2015, 11:20 AM

## 2015-05-03 ENCOUNTER — Other Ambulatory Visit: Payer: Self-pay | Admitting: *Deleted

## 2015-05-05 ENCOUNTER — Other Ambulatory Visit: Payer: Self-pay

## 2015-05-05 ENCOUNTER — Telehealth: Payer: Self-pay | Admitting: Radiation Oncology

## 2015-05-05 DIAGNOSIS — C50412 Malignant neoplasm of upper-outer quadrant of left female breast: Secondary | ICD-10-CM

## 2015-05-05 DIAGNOSIS — C50919 Malignant neoplasm of unspecified site of unspecified female breast: Secondary | ICD-10-CM

## 2015-05-05 NOTE — Telephone Encounter (Signed)
Aimee Poole called from [employer] disability and wanted Aimee Poole's last date seen by Dr. Pablo Ledger (6/7) and symptoms (skin darkening) and if f/u is scheduled (1 month).

## 2015-05-07 NOTE — Progress Notes (Signed)
  Radiation Oncology         [phone] ________________________________  Name: LAURALYE KINN MRN: [id number]  Date: 05/02/2015  DOB: 11-Dec-1970  End of Treatment Note  Diagnosis:  Breast cancer of upper-outer quadrant of left female breast   Staging form: Breast, AJCC 7th Edition     Clinical: Stage IIA (T2, N0, cM0) - Signed by Rulon Eisenmenger, MD on 09/20/2014     Pathologic: Stage 0 (Tis, N0, cM0) - Signed by Rolm Bookbinder, MD on 03/13/2012    Indication for treatment:  Curative      Radiation treatment dates:   03/16/2015-05/02/2015  Site/dose:   Left breast/ 45 Gy at 1.8 Gy per fraction x 25 fractions.  Left supraclavicular fossa and axilla/ 45 Gy at 1.8 Gy per fraction x 25 fractions Left breast boost/ 16 Gy at 2 Gy per fraction x 8 fractions  Beams/energy:  Opposed tangents with reduced fields / 6, 10 and 15 MV photons RAO/LPO 10 and 15 MV photons 4 field photon plan / 15 MV photons  Narrative: The patient tolerated radiation treatment relatively well.   She had skin darkening and dry desquamation but tolerated this well.   Plan: The patient has completed radiation treatment. The patient will return to radiation oncology clinic for routine followup in one month. I advised them to call or return sooner if they have any questions or concerns related to their recovery or treatment.  ------------------------------------------------  Thea Silversmith, MD

## 2015-05-08 ENCOUNTER — Other Ambulatory Visit (HOSPITAL_BASED_OUTPATIENT_CLINIC_OR_DEPARTMENT_OTHER): Payer: BLUE CROSS/BLUE SHIELD

## 2015-05-08 ENCOUNTER — Ambulatory Visit: Payer: BLUE CROSS/BLUE SHIELD

## 2015-05-08 ENCOUNTER — Ambulatory Visit (HOSPITAL_BASED_OUTPATIENT_CLINIC_OR_DEPARTMENT_OTHER): Payer: BLUE CROSS/BLUE SHIELD

## 2015-05-08 VITALS — BP 128/79 | HR 80 | Temp 98.6°F | Resp 18

## 2015-05-08 DIAGNOSIS — C50412 Malignant neoplasm of upper-outer quadrant of left female breast: Secondary | ICD-10-CM

## 2015-05-08 DIAGNOSIS — Z95828 Presence of other vascular implants and grafts: Secondary | ICD-10-CM

## 2015-05-08 DIAGNOSIS — C50919 Malignant neoplasm of unspecified site of unspecified female breast: Secondary | ICD-10-CM

## 2015-05-08 DIAGNOSIS — Z5112 Encounter for antineoplastic immunotherapy: Secondary | ICD-10-CM | POA: Diagnosis not present

## 2015-05-08 LAB — CBC WITH DIFFERENTIAL/PLATELET
BASO%: 0.7 % (ref 0.0–2.0)
BASOS ABS: 0 10*3/uL (ref 0.0–0.1)
EOS%: 2.2 % (ref 0.0–7.0)
Eosinophils Absolute: 0.1 10*3/uL (ref 0.0–0.5)
HEMATOCRIT: 35.5 % (ref 34.8–46.6)
HEMOGLOBIN: 12.3 g/dL (ref 11.6–15.9)
LYMPH%: 22.1 % (ref 14.0–49.7)
MCH: 31.1 pg (ref 25.1–34.0)
MCHC: 34.6 g/dL (ref 31.5–36.0)
MCV: 89.6 fL (ref 79.5–101.0)
MONO#: 0.2 10*3/uL (ref 0.1–0.9)
MONO%: 7.2 % (ref 0.0–14.0)
NEUT#: 1.9 10*3/uL (ref 1.5–6.5)
NEUT%: 67.8 % (ref 38.4–76.8)
Platelets: 202 10*3/uL (ref 145–400)
RBC: 3.96 10*6/uL (ref 3.70–5.45)
RDW: 11.7 % (ref 11.2–14.5)
WBC: 2.8 10*3/uL — AB (ref 3.9–10.3)
lymph#: 0.6 10*3/uL — ABNORMAL LOW (ref 0.9–3.3)

## 2015-05-08 LAB — COMPREHENSIVE METABOLIC PANEL (CC13)
ALBUMIN: 3.4 g/dL — AB (ref 3.5–5.0)
ALT: 13 U/L (ref 0–55)
AST: 19 U/L (ref 5–34)
Alkaline Phosphatase: 123 U/L (ref 40–150)
Anion Gap: 7 mEq/L (ref 3–11)
BUN: 8.6 mg/dL (ref 7.0–26.0)
CO2: 24 mEq/L (ref 22–29)
Calcium: 10.4 mg/dL (ref 8.4–10.4)
Chloride: 109 mEq/L (ref 98–109)
Creatinine: 0.7 mg/dL (ref 0.6–1.1)
EGFR: >90 mL/min/{1.73_m2} (ref >90)
GLUCOSE: 98 mg/dL (ref 70–140)
POTASSIUM: 4.1 meq/L (ref 3.5–5.1)
SODIUM: 140 meq/L (ref 136–145)
TOTAL PROTEIN: 6.7 g/dL (ref 6.4–8.3)
Total Bilirubin: 0.46 mg/dL (ref 0.20–1.20)

## 2015-05-08 MED ORDER — HEPARIN SOD (PORK) LOCK FLUSH 100 UNIT/ML IV SOLN
500.0000 [IU] | Freq: Once | INTRAVENOUS | Status: AC | PRN
Start: 2015-05-08 — End: 2015-05-08
  Administered 2015-05-08: 500 [IU]
  Filled 2015-05-08: qty 5

## 2015-05-08 MED ORDER — DIPHENHYDRAMINE HCL 25 MG PO CAPS
ORAL_CAPSULE | ORAL | Status: AC
  Filled 2015-05-08: qty 2

## 2015-05-08 MED ORDER — ACETAMINOPHEN 325 MG PO TABS
ORAL_TABLET | ORAL | Status: AC
  Filled 2015-05-08: qty 2

## 2015-05-08 MED ORDER — DIPHENHYDRAMINE HCL 25 MG PO CAPS
50.0000 mg | ORAL_CAPSULE | Freq: Once | ORAL | Status: AC
  Administered 2015-05-08: 50 mg via ORAL

## 2015-05-08 MED ORDER — ACETAMINOPHEN 325 MG PO TABS
650.0000 mg | ORAL_TABLET | Freq: Once | ORAL | Status: AC
  Administered 2015-05-08: 650 mg via ORAL

## 2015-05-08 MED ORDER — SODIUM CHLORIDE 0.9 % IJ SOLN
10.0000 mL | INTRAMUSCULAR | Status: DC | PRN
  Administered 2015-05-08: 10 mL
  Filled 2015-05-08: qty 10

## 2015-05-08 MED ORDER — SODIUM CHLORIDE 0.9 % IV SOLN
250.0000 mL | Freq: Once | INTRAVENOUS | Status: AC
  Administered 2015-05-08: 250 mL via INTRAVENOUS

## 2015-05-08 MED ORDER — SODIUM CHLORIDE 0.9 % IV SOLN
6.0000 mg/kg | Freq: Once | INTRAVENOUS | Status: AC
  Administered 2015-05-08: 462 mg via INTRAVENOUS
  Filled 2015-05-08: qty 22

## 2015-05-08 MED ORDER — SODIUM CHLORIDE 0.9 % IJ SOLN
10.0000 mL | INTRAMUSCULAR | Status: DC | PRN
  Administered 2015-05-08: 10 mL via INTRAVENOUS
  Filled 2015-05-08: qty 10

## 2015-05-08 NOTE — Patient Instructions (Signed)

## 2015-05-08 NOTE — Patient Instructions (Signed)
[facility] Discharge Instructions for Patients Receiving Chemotherapy  Today you received the following chemotherapy agents: herceptin  To help prevent nausea and vomiting after your treatment, we encourage you to take your nausea medication.  Take it as often as prescribed.     If you develop nausea and vomiting that is not controlled by your nausea medication, call the clinic. If it is after clinic hours your family physician or the after hours number for the clinic or go to the Emergency Department.   BELOW ARE SYMPTOMS THAT SHOULD BE REPORTED IMMEDIATELY:  *FEVER GREATER THAN 100.5 F  *CHILLS WITH OR WITHOUT FEVER  NAUSEA AND VOMITING THAT IS NOT CONTROLLED WITH YOUR NAUSEA MEDICATION  *UNUSUAL SHORTNESS OF BREATH  *UNUSUAL BRUISING OR BLEEDING  TENDERNESS IN MOUTH AND THROAT WITH OR WITHOUT PRESENCE OF ULCERS  *URINARY PROBLEMS  *BOWEL PROBLEMS  UNUSUAL RASH Items with * indicate a potential emergency and should be followed up as soon as possible.  Feel free to call the clinic you have any questions or concerns. The clinic phone number is [phone].   I have been informed and understand all the instructions given to me. I know to contact the clinic, my physician, or go to the Emergency Department if any problems should occur. I do not have any questions at this time, but understand that I may call the clinic during office hours   should I have any questions or need assistance in obtaining follow up care.    __________________________________________  _____________  __________ Signature of Patient or Authorized Representative            Date                   Time    __________________________________________ Nurse's Signature    

## 2015-05-09 ENCOUNTER — Telehealth: Payer: Self-pay
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
## 2015-05-17 NOTE — Progress Notes (Signed)
Name: Aimee Poole   MRN: [id number]  Date:  04/19/15   DOB: 1971-07-31  Status:outpatient    DIAGNOSIS: Breast cancer of upper-outer quadrant of left female breast   Staging form: Breast, AJCC 7th Edition     Clinical: Stage IIA (T2, N0, cM0) - Signed by Rulon Eisenmenger, MD on 09/20/2014     Pathologic: Stage 0 (Tis, N0, cM0) - Signed by Rolm Bookbinder, MD on 03/13/2012   CONSENT VERIFIED: yes   SET UP: Patient is setup supine   IMMOBILIZATION:  The following immobilization was used:Custom Moldable Pillow, breast board.   NARRATIVE: Aimee Poole underwent complex simulation and treatment planning for her boost treatment today.  Her tumor volume was outlined on the planning CT scan.  Due to the depth of her cavity, electrons could not be used and a photon plan was developed. The plan will be prescribed to the  isodose line.   I personally supervised and approved the creation of 4 unique MLCs comprising 4 treatment devices.

## 2015-05-30 ENCOUNTER — Encounter: Payer: Self-pay | Admitting: Radiation Oncology

## 2015-05-30 ENCOUNTER — Ambulatory Visit
Admission: RE | Admit: 2015-05-30 | Discharge: 2015-05-30 | Disposition: A | Discharge status: Home / Self Care | Payer: BLUE CROSS/BLUE SHIELD | Source: Ambulatory Visit | Attending: Radiation Oncology | Admitting: Radiation Oncology

## 2015-05-30 ENCOUNTER — Other Ambulatory Visit (HOSPITAL_BASED_OUTPATIENT_CLINIC_OR_DEPARTMENT_OTHER): Payer: BLUE CROSS/BLUE SHIELD

## 2015-05-30 ENCOUNTER — Ambulatory Visit (HOSPITAL_BASED_OUTPATIENT_CLINIC_OR_DEPARTMENT_OTHER): Payer: BLUE CROSS/BLUE SHIELD | Admitting: Hematology and Oncology

## 2015-05-30 ENCOUNTER — Ambulatory Visit (HOSPITAL_BASED_OUTPATIENT_CLINIC_OR_DEPARTMENT_OTHER): Payer: BLUE CROSS/BLUE SHIELD

## 2015-05-30 ENCOUNTER — Telehealth: Payer: Self-pay | Admitting: Hematology and Oncology

## 2015-05-30 ENCOUNTER — Encounter: Payer: Self-pay | Admitting: Hematology and Oncology

## 2015-05-30 ENCOUNTER — Ambulatory Visit: Payer: BLUE CROSS/BLUE SHIELD

## 2015-05-30 VITALS — BP 124/85 | HR 93 | Temp 98.3°F | Resp 20 | Ht 61.5 in | Wt 178.4 lb

## 2015-05-30 VITALS — BP 142/81 | HR 76 | Temp 98.2°F | Resp 18 | Ht 61.5 in | Wt 177.4 lb

## 2015-05-30 DIAGNOSIS — R238 Other skin changes: Secondary | ICD-10-CM

## 2015-05-30 DIAGNOSIS — M791 Myalgia: Secondary | ICD-10-CM | POA: Diagnosis not present

## 2015-05-30 DIAGNOSIS — Z95828 Presence of other vascular implants and grafts: Secondary | ICD-10-CM

## 2015-05-30 DIAGNOSIS — Z5112 Encounter for antineoplastic immunotherapy: Secondary | ICD-10-CM | POA: Diagnosis not present

## 2015-05-30 DIAGNOSIS — C50412 Malignant neoplasm of upper-outer quadrant of left female breast: Secondary | ICD-10-CM | POA: Diagnosis not present

## 2015-05-30 DIAGNOSIS — C50919 Malignant neoplasm of unspecified site of unspecified female breast: Secondary | ICD-10-CM

## 2015-05-30 LAB — CBC WITH DIFFERENTIAL/PLATELET
BASO%: 0.9 % (ref 0.0–2.0)
BASOS ABS: 0 10*3/uL (ref 0.0–0.1)
EOS ABS: 0.1 10*3/uL (ref 0.0–0.5)
EOS%: 2.4 % (ref 0.0–7.0)
HCT: 35.8 % (ref 34.8–46.6)
HEMOGLOBIN: 12.3 g/dL (ref 11.6–15.9)
LYMPH#: 0.8 10*3/uL — AB (ref 0.9–3.3)
LYMPH%: 27.8 % (ref 14.0–49.7)
MCH: 30.7 pg (ref 25.1–34.0)
MCHC: 34.4 g/dL (ref 31.5–36.0)
MCV: 89.1 fL (ref 79.5–101.0)
MONO#: 0.3 10*3/uL (ref 0.1–0.9)
MONO%: 9.1 % (ref 0.0–14.0)
NEUT#: 1.8 10*3/uL (ref 1.5–6.5)
NEUT%: 59.8 % (ref 38.4–76.8)
Platelets: 228 10*3/uL (ref 145–400)
RBC: 4.02 10*6/uL (ref 3.70–5.45)
RDW: 12.4 % (ref 11.2–14.5)
WBC: 3 10*3/uL — AB (ref 3.9–10.3)

## 2015-05-30 LAB — COMPREHENSIVE METABOLIC PANEL (CC13)
ALBUMIN: 3.5 g/dL (ref 3.5–5.0)
ALK PHOS: 138 U/L (ref 40–150)
ALT: 18 U/L (ref 0–55)
AST: 22 U/L (ref 5–34)
Anion Gap: 8 mEq/L (ref 3–11)
BUN: 7.8 mg/dL (ref 7.0–26.0)
CALCIUM: 10.7 mg/dL — AB (ref 8.4–10.4)
CHLORIDE: 107 meq/L (ref 98–109)
CO2: 25 mEq/L (ref 22–29)
Creatinine: 0.8 mg/dL (ref 0.6–1.1)
EGFR: >90 mL/min/{1.73_m2} (ref >90)
Glucose: 98 mg/dl (ref 70–140)
POTASSIUM: 4 meq/L (ref 3.5–5.1)
SODIUM: 139 meq/L (ref 136–145)
TOTAL PROTEIN: 6.8 g/dL (ref 6.4–8.3)
Total Bilirubin: 0.5 mg/dL (ref 0.20–1.20)

## 2015-05-30 MED ORDER — SODIUM CHLORIDE 0.9 % IJ SOLN
10.0000 mL | INTRAMUSCULAR | Status: DC | PRN
  Administered 2015-05-30: 10 mL via INTRAVENOUS
  Filled 2015-05-30: qty 10

## 2015-05-30 MED ORDER — DIPHENHYDRAMINE HCL 25 MG PO CAPS
50.0000 mg | ORAL_CAPSULE | Freq: Once | ORAL | Status: AC
  Administered 2015-05-30: 50 mg via ORAL

## 2015-05-30 MED ORDER — ACETAMINOPHEN 325 MG PO TABS
650.0000 mg | ORAL_TABLET | Freq: Once | ORAL | Status: AC
  Administered 2015-05-30: 650 mg via ORAL

## 2015-05-30 MED ORDER — SODIUM CHLORIDE 0.9 % IV SOLN
6.0000 mg/kg | Freq: Once | INTRAVENOUS | Status: AC
  Administered 2015-05-30: 462 mg via INTRAVENOUS
  Filled 2015-05-30: qty 22

## 2015-05-30 MED ORDER — HEPARIN SOD (PORK) LOCK FLUSH 100 UNIT/ML IV SOLN
500.0000 [IU] | Freq: Once | INTRAVENOUS | Status: AC | PRN
  Administered 2015-05-30: 500 [IU]
  Filled 2015-05-30: qty 5

## 2015-05-30 MED ORDER — ACETAMINOPHEN 325 MG PO TABS
ORAL_TABLET | ORAL | Status: AC
  Filled 2015-05-30: qty 2

## 2015-05-30 MED ORDER — LIDOCAINE-PRILOCAINE 2.5-2.5 % EX CREA: 1.0000 "application " | TOPICAL_CREAM | CUTANEOUS | Status: DC | PRN

## 2015-05-30 MED ORDER — TAMOXIFEN CITRATE 20 MG PO TABS: 20.0000 mg | ORAL_TABLET | Freq: Every day | ORAL | Status: DC

## 2015-05-30 MED ORDER — SODIUM CHLORIDE 0.9 % IJ SOLN
10.0000 mL | INTRAMUSCULAR | Status: DC | PRN
  Administered 2015-05-30: 10 mL
  Filled 2015-05-30: qty 10

## 2015-05-30 MED ORDER — SODIUM CHLORIDE 0.9 % IV SOLN
Freq: Once | INTRAVENOUS | Status: AC
  Administered 2015-05-30: 10:00:00 via INTRAVENOUS

## 2015-05-30 MED ORDER — DIPHENHYDRAMINE HCL 25 MG PO CAPS
ORAL_CAPSULE | ORAL | Status: AC
  Filled 2015-05-30: qty 2

## 2015-05-30 NOTE — Patient Instructions (Signed)
[facility] Discharge Instructions for Patients Receiving Chemotherapy  Today you received the following chemotherapy agents:  Herceptin  To help prevent nausea and vomiting after your treatment, we encourage you to take your nausea medication as prescribed.   If you develop nausea and vomiting that is not controlled by your nausea medication, call the clinic.   BELOW ARE SYMPTOMS THAT SHOULD BE REPORTED IMMEDIATELY:  *FEVER GREATER THAN 100.5 F  *CHILLS WITH OR WITHOUT FEVER  NAUSEA AND VOMITING THAT IS NOT CONTROLLED WITH YOUR NAUSEA MEDICATION  *UNUSUAL SHORTNESS OF BREATH  *UNUSUAL BRUISING OR BLEEDING  TENDERNESS IN MOUTH AND THROAT WITH OR WITHOUT PRESENCE OF ULCERS  *URINARY PROBLEMS  *BOWEL PROBLEMS  UNUSUAL RASH Items with * indicate a potential emergency and should be followed up as soon as possible.  Feel free to call the clinic you have any questions or concerns. The clinic phone number is [phone].  Please show the CHEMO ALERT CARD at check-in to the Emergency Department and triage nurse.   

## 2015-05-30 NOTE — Progress Notes (Addendum)
Follow up s/p rd txs left breast  Well healed, slight tanning under axilla, has Med/onc appt today with Dr. Unice Bailey and Herceptin infusion, appetite good, energy level fair, gave Live strong pamphlett, she k nows about FYYN 8:00 AM . 7:57 AM BP 124/85 mmHg  Pulse 93  Temp(Src) 98.3 F (36.8 C) (Oral)  Resp 20  Ht 5' 1.5" (1.562 m)  Wt 178 lb 6.4 oz (80.922 kg)  BMI 33.17 kg/m2  Wt Readings from Last 3 Encounters:  05/30/15 178 lb 6.4 oz (80.922 kg)  05/02/15 175 lb 6.4 oz (79.561 kg)  04/18/15 176 lb 3.2 oz (79.924 kg)

## 2015-05-30 NOTE — Progress Notes (Signed)
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 The genes tested were ATM, BARD1, BRCA1, BRCA2, BRIP1, CDH1, CHEK2, MRE11A, MUTYH, NBN, NF1, PALB2, PTEN, RAD50, RAD51C, RAD51D, and TP53.   01/30/2015 Breast MRI Left breast: Masses decrease in size from 4 cm to 2.8 cm abuts the pectoralis muscle   02/07/2015 -  Chemotherapy Herceptin maintenance therapy to complete November 2016   02/09/2015 Surgery Left breast lumpectomy: Pathologic complete response   03/13/2015 - 05/02/2015 Radiation Therapy Adjuvant radiation therapy    CHIEF COMPLIANT: Follow-up of the radiation therapy on maintenance Herceptin  INTERVAL HISTORY: Aimee Poole is a 44 year old with above-mentioned history of left breast cancer where a complete pathologic response to initial chemotherapy and is currently on Herceptin maintenance. She completed radiation therapy and had done fairly well other than sunburn-like rash and  discomfort under the breasts. She is here today to start antiestrogen therapy. This is because she has 7% ER positivity. She reports to be feeling well other than muscular aches and pains. She has not been exercising and doing any physical activity. She does have hot flashes and has not had resumption of periods since chemotherapy.  REVIEW OF SYSTEMS:   Constitutional: Denies fevers, chills or abnormal weight loss Eyes: Denies blurriness of vision Ears, nose, mouth, throat, and face: Denies mucositis or sore throat Respiratory: Denies cough, dyspnea or wheezes Cardiovascular: Denies palpitation, chest discomfort or lower extremity swelling Gastrointestinal:  Denies nausea, heartburn or change in bowel habits Skin: Denies abnormal skin rashes Lymphatics: Denies new lymphadenopathy or easy bruising Neurological:Denies numbness, tingling or new weaknesses Behavioral/Psych: Mood is stable, no new changes  Breast:  denies any pain or lumps or nodules in either breasts All other systems were reviewed with the patient and are negative.  I have reviewed the past medical history, past surgical history, social history and family history with the patient and they are unchanged from previous note.  ALLERGIES:  is allergic to corn-containing products.  MEDICATIONS:  Current Outpatient Prescriptions  Medication Sig Dispense Refill  . diphenhydramine-acetaminophen (TYLENOL PM) 25-500 MG TABS Take 1 tablet by mouth at bedtime as needed.    . diphenoxylate-atropine (LOMOTIL) 2.5-0.025 MG per tablet Take 1 tablet by mouth 4 (four) times daily as needed for diarrhea or loose stools. 30 tablet 0  . Emollient (AQUAPHOR ADVANCED THERAPY) OINT Take 1 application by mouth daily. Hands and feet  0  .  lidocaine-prilocaine (EMLA) cream Apply 1 application topically as needed. 30 g 3  . loratadine (CLARITIN) 10 MG tablet Take 10 mg by mouth daily.    Marland Kitchen LORazepam (ATIVAN) 0.5 MG tablet Take by mouth daily as needed.    0  . oxyCODONE-acetaminophen (PERCOCET) 10-325 MG per tablet Take 1 tablet by mouth every 6 (six) hours as needed for pain. 20 tablet 0  . tamoxifen (NOLVADEX) 20 MG tablet Take 1 tablet (20 mg total) by mouth daily. 90 tablet 3   No current facility-administered medications for this visit.    PHYSICAL EXAMINATION: ECOG PERFORMANCE STATUS: 1 - Symptomatic but completely ambulatory  There were no vitals filed for this visit. There were no vitals filed for this visit.  GENERAL:alert, no distress and comfortable SKIN: skin color, texture, turgor are normal, no rashes or significant lesions EYES: normal, Conjunctiva are pink and non-injected, sclera clear OROPHARYNX:no exudate, no erythema and lips, buccal mucosa, and tongue normal  NECK: supple, thyroid normal size, non-tender, without nodularity LYMPH:  no palpable lymphadenopathy in the cervical, axillary or inguinal LUNGS: clear to auscultation and percussion with normal breathing effort HEART: regular rate & rhythm and no murmurs and no lower extremity edema ABDOMEN:abdomen soft, non-tender and normal bowel sounds Musculoskeletal:no cyanosis of digits and no clubbing  NEURO: alert & oriented x 3 with fluent speech, no focal motor/sensory deficits   LABORATORY DATA:  I have reviewed the data as listed   Chemistry      Component Value Date/Time   NA 139 05/30/2015 0843   NA 139 01/24/2015 1120   K 4.0 05/30/2015 0843   K 4.2 01/24/2015 1120   CL 106 01/24/2015 1120   CO2 25 05/30/2015 0843   CO2 28 01/24/2015 1120   BUN 7.8 05/30/2015 0843   BUN 12 01/24/2015 1120   CREATININE 0.8 05/30/2015 0843   CREATININE 0.71 01/24/2015 1120      Component Value Date/Time   CALCIUM 10.7* 05/30/2015 0843   CALCIUM 11.0* 01/24/2015 1120   ALKPHOS 138 05/30/2015 0843   ALKPHOS 91 11/01/2009 1212   AST 22 05/30/2015 0843   AST 22 11/01/2009 1212   ALT 18 05/30/2015 0843   ALT 22 11/01/2009 1212   BILITOT 0.50 05/30/2015 0843    BILITOT 0.6 11/01/2009 1212       Lab Results  Component Value Date   WBC 3.0* 05/30/2015   HGB 12.3 05/30/2015   HCT 35.8 05/30/2015   MCV 89.1 05/30/2015   PLT 228 05/30/2015   NEUTROABS 1.8 05/30/2015   ASSESSMENT & PLAN:  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 clinical stage ER 7% PR 0% HER-2 positive ratio 3.84 in the reconstructed left breast. Completed 6 cycles of of TCHP, currently on Herceptin maintenance that will complete November 2016  Post neoadjuvant MRI: Decrease in size of the mass from 4 cm to 2.8 cm; Final pathology counseling: pathologic complete response based on lumpectomy March 2016   Current treatment:  1. Herceptin maintenance once every 3 weeks  2. Adjuvant radiation therapy on going 3. Once she completes XRT, will begin adjuvant Anti-estrogen therapy.  Myalgias: I discussed with her that exercise will help the muscle aches and pains. Skin changes due to radiation: Much improved   Tamoxifen counseling: We discussed the risks and benefits of tamoxifen. These include but not limited to insomnia, hot flashes, mood changes, vaginal dryness, and weight  gain. Although rare, serious side effects including endometrial cancer, risk of blood clots were also discussed. We strongly believe that the benefits far outweigh the risks. Patient understands these risks and consented to starting treatment. Planned treatment duration is 10 years.  Return to clinic every 3 weeks for Herceptin maintenance every 6 weeks to follow up with me. Start antiestrogen therapy with tamoxifen 20 mg daily 10 years.    No orders of the defined types were placed in this encounter.   The patient has a good understanding of the overall plan. she agrees with it. she will call with any problems that may develop before the next visit here.   Rulon Eisenmenger, MD

## 2015-05-30 NOTE — Assessment & Plan Note (Signed)
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 clinical stage ER 7% PR 0% HER-2 positive ratio 3.84 in the reconstructed left breast. Completed 6 cycles of of TCHP  Post neoadjuvant MRI: Decrease in size of the mass from 4 cm to 2.8 cm; Final pathology counseling: pathologic complete response based on lumpectomy March 2016   Current treatment:  1. Herceptin maintenance once every 3 weeks  2. Adjuvant radiation therapy on going 3. Once she completes XRT, will begin adjuvant Anti-estrogen therapy.  Myalgias: I discussed with her that exercise will help the muscle aches and pains. Skin changes due to radiation: Being addressed by radiation oncology  Return to clinic every 3 weeks for Herceptin maintenance every 6 weeks to follow up with me. Start antiestrogen therapy with tamoxifen 20 mg daily 10 years.  Tamoxifen counseling: We discussed the risks and benefits of tamoxifen. These include but not limited to insomnia, hot flashes, mood changes, vaginal dryness, and weight gain. Although rare, serious side effects including endometrial cancer, risk of blood clots were also discussed. We strongly believe that the benefits far outweigh the risks. Patient understands these risks and consented to starting treatment. Planned treatment duration is 10 years.

## 2015-05-30 NOTE — Telephone Encounter (Signed)
Appointments made and avs will be printed in chemo  °

## 2015-05-30 NOTE — Progress Notes (Signed)
   Department of Radiation Oncology  Phone:  [phone] Fax:        [phone]   Name: Aimee Poole MRN: [id number]  DOB: [birth year]  Date: 05/30/2015  Follow Up Visit Note  Diagnosis: Breast cancer of upper-outer quadrant of left female breast   Staging form: Breast, AJCC 7th Edition     Clinical: Stage IIA (T2, N0, cM0) - Signed by Rulon Eisenmenger, MD on 09/20/2014     Pathologic: Stage 0 (Tis, N0, cM0) - Signed by Rolm Bookbinder, MD on 03/13/2012  Summary and Interval since last radiation: 1 month  Interval History: Aimee Poole presents today for routine followup.  She has done well. Her skin has healed up well. She still has some darkness in the outer left breast and supraclavicular fossa. She has an appointment with Dr. Lindi Adie and Herceptin today.   Physical Exam:  Filed Vitals:   05/30/15 0800  BP: 124/85  Pulse: 93  Temp: 98.3 F (36.8 C)  TempSrc: Oral  Resp: 20  Height: 5' 1.5" (1.562 m)  Weight: 178 lb 6.4 oz (80.922 kg)   Skin darkening over the left inframammary fold and left axilla.   IMPRESSION: Aimee Poole is a 44 y.o. female s/p breast conservation in her TRAM flap with resolving acute effects of treatment  PLAN:  She is doing well. We discussed the need for follow up every 4-6 months which she has scheduled.  We discussed the need for yearly mammograms which she can schedule with her OBGYN or with medical oncology. We discussed the need for sun protection in the treated area.  She can always call me with questions.  I will follow up with her on an as needed basis.      Aimee Silversmith, MD

## 2015-05-30 NOTE — Patient Instructions (Signed)

## 2015-06-19 ENCOUNTER — Ambulatory Visit (HOSPITAL_BASED_OUTPATIENT_CLINIC_OR_DEPARTMENT_OTHER): Payer: BLUE CROSS/BLUE SHIELD

## 2015-06-19 ENCOUNTER — Other Ambulatory Visit (HOSPITAL_BASED_OUTPATIENT_CLINIC_OR_DEPARTMENT_OTHER): Payer: BLUE CROSS/BLUE SHIELD

## 2015-06-19 ENCOUNTER — Ambulatory Visit: Payer: BLUE CROSS/BLUE SHIELD

## 2015-06-19 VITALS — BP 122/63 | HR 83 | Temp 97.0°F | Resp 16

## 2015-06-19 DIAGNOSIS — C50412 Malignant neoplasm of upper-outer quadrant of left female breast: Secondary | ICD-10-CM | POA: Diagnosis not present

## 2015-06-19 DIAGNOSIS — C50919 Malignant neoplasm of unspecified site of unspecified female breast: Secondary | ICD-10-CM

## 2015-06-19 DIAGNOSIS — Z5112 Encounter for antineoplastic immunotherapy: Secondary | ICD-10-CM

## 2015-06-19 DIAGNOSIS — Z95828 Presence of other vascular implants and grafts: Secondary | ICD-10-CM

## 2015-06-19 LAB — CBC WITH DIFFERENTIAL/PLATELET
BASO%: 0.8 % (ref 0.0–2.0)
BASOS ABS: 0 10*3/uL (ref 0.0–0.1)
EOS ABS: 0.1 10*3/uL (ref 0.0–0.5)
EOS%: 2.5 % (ref 0.0–7.0)
HCT: 35.3 % (ref 34.8–46.6)
HEMOGLOBIN: 12.2 g/dL (ref 11.6–15.9)
LYMPH#: 1.1 10*3/uL (ref 0.9–3.3)
LYMPH%: 29.2 % (ref 14.0–49.7)
MCH: 30.8 pg (ref 25.1–34.0)
MCHC: 34.6 g/dL (ref 31.5–36.0)
MCV: 89.1 fL (ref 79.5–101.0)
MONO#: 0.3 10*3/uL (ref 0.1–0.9)
MONO%: 6.9 % (ref 0.0–14.0)
NEUT#: 2.2 10*3/uL (ref 1.5–6.5)
NEUT%: 60.6 % (ref 38.4–76.8)
PLATELETS: 260 10*3/uL (ref 145–400)
RBC: 3.96 10*6/uL (ref 3.70–5.45)
RDW: 13.2 % (ref 11.2–14.5)
WBC: 3.7 10*3/uL — ABNORMAL LOW (ref 3.9–10.3)

## 2015-06-19 LAB — COMPREHENSIVE METABOLIC PANEL (CC13)
ALT: 18 U/L (ref 0–55)
AST: 21 U/L (ref 5–34)
Albumin: 3.5 g/dL (ref 3.5–5.0)
Alkaline Phosphatase: 133 U/L (ref 40–150)
Anion Gap: 8 mEq/L (ref 3–11)
BILIRUBIN TOTAL: 0.28 mg/dL (ref 0.20–1.20)
BUN: 16.9 mg/dL (ref 7.0–26.0)
CALCIUM: 10.3 mg/dL (ref 8.4–10.4)
CO2: 23 mEq/L (ref 22–29)
Chloride: 111 mEq/L — ABNORMAL HIGH (ref 98–109)
Creatinine: 0.8 mg/dL (ref 0.6–1.1)
EGFR: >90 mL/min/{1.73_m2} (ref >90)
GLUCOSE: 99 mg/dL (ref 70–140)
POTASSIUM: 4.2 meq/L (ref 3.5–5.1)
Sodium: 142 mEq/L (ref 136–145)
TOTAL PROTEIN: 6.9 g/dL (ref 6.4–8.3)

## 2015-06-19 MED ORDER — DIPHENHYDRAMINE HCL 25 MG PO CAPS
ORAL_CAPSULE | ORAL | Status: AC
  Filled 2015-06-19: qty 2

## 2015-06-19 MED ORDER — ACETAMINOPHEN 325 MG PO TABS
650.0000 mg | ORAL_TABLET | Freq: Once | ORAL | Status: AC
  Administered 2015-06-19: 650 mg via ORAL

## 2015-06-19 MED ORDER — SODIUM CHLORIDE 0.9 % IV SOLN
Freq: Once | INTRAVENOUS | Status: AC
  Administered 2015-06-19: 10:00:00 via INTRAVENOUS

## 2015-06-19 MED ORDER — SODIUM CHLORIDE 0.9 % IJ SOLN
10.0000 mL | INTRAMUSCULAR | Status: DC | PRN
  Administered 2015-06-19: 10 mL via INTRAVENOUS
  Filled 2015-06-19: qty 10

## 2015-06-19 MED ORDER — HEPARIN SOD (PORK) LOCK FLUSH 100 UNIT/ML IV SOLN
500.0000 [IU] | Freq: Once | INTRAVENOUS | Status: AC | PRN
  Administered 2015-06-19: 500 [IU]
  Filled 2015-06-19: qty 5

## 2015-06-19 MED ORDER — ACETAMINOPHEN 325 MG PO TABS
ORAL_TABLET | ORAL | Status: AC
Start: 2015-06-19 — End: 2015-06-19
  Filled 2015-06-19: qty 2

## 2015-06-19 MED ORDER — TRASTUZUMAB CHEMO INJECTION 440 MG
6.0000 mg/kg | Freq: Once | INTRAVENOUS | Status: AC
  Administered 2015-06-19: 462 mg via INTRAVENOUS
  Filled 2015-06-19: qty 22

## 2015-06-19 MED ORDER — SODIUM CHLORIDE 0.9 % IJ SOLN
10.0000 mL | INTRAMUSCULAR | Status: DC | PRN
  Administered 2015-06-19: 10 mL
  Filled 2015-06-19: qty 10

## 2015-06-19 MED ORDER — DIPHENHYDRAMINE HCL 25 MG PO CAPS
50.0000 mg | ORAL_CAPSULE | Freq: Once | ORAL | Status: AC
  Administered 2015-06-19: 50 mg via ORAL

## 2015-06-19 NOTE — Patient Instructions (Signed)

## 2015-06-19 NOTE — Patient Instructions (Signed)

## 2015-07-10 ENCOUNTER — Other Ambulatory Visit (HOSPITAL_BASED_OUTPATIENT_CLINIC_OR_DEPARTMENT_OTHER): Payer: BLUE CROSS/BLUE SHIELD

## 2015-07-10 ENCOUNTER — Encounter: Payer: Self-pay | Admitting: Nurse Practitioner

## 2015-07-10 ENCOUNTER — Ambulatory Visit: Payer: BLUE CROSS/BLUE SHIELD

## 2015-07-10 ENCOUNTER — Ambulatory Visit (HOSPITAL_BASED_OUTPATIENT_CLINIC_OR_DEPARTMENT_OTHER): Payer: BLUE CROSS/BLUE SHIELD

## 2015-07-10 ENCOUNTER — Ambulatory Visit (HOSPITAL_BASED_OUTPATIENT_CLINIC_OR_DEPARTMENT_OTHER): Payer: BLUE CROSS/BLUE SHIELD | Admitting: Nurse Practitioner

## 2015-07-10 ENCOUNTER — Other Ambulatory Visit: Payer: BLUE CROSS/BLUE SHIELD

## 2015-07-10 VITALS — BP 136/70 | HR 79 | Temp 98.6°F | Resp 18 | Ht 61.5 in | Wt 177.6 lb

## 2015-07-10 DIAGNOSIS — Z95828 Presence of other vascular implants and grafts: Secondary | ICD-10-CM

## 2015-07-10 DIAGNOSIS — M791 Myalgia: Secondary | ICD-10-CM | POA: Diagnosis not present

## 2015-07-10 DIAGNOSIS — R238 Other skin changes: Secondary | ICD-10-CM

## 2015-07-10 DIAGNOSIS — C50412 Malignant neoplasm of upper-outer quadrant of left female breast: Secondary | ICD-10-CM

## 2015-07-10 DIAGNOSIS — Z5112 Encounter for antineoplastic immunotherapy: Secondary | ICD-10-CM

## 2015-07-10 DIAGNOSIS — N951 Menopausal and female climacteric states: Secondary | ICD-10-CM

## 2015-07-10 DIAGNOSIS — R232 Flushing: Secondary | ICD-10-CM | POA: Insufficient documentation

## 2015-07-10 DIAGNOSIS — C50919 Malignant neoplasm of unspecified site of unspecified female breast: Secondary | ICD-10-CM

## 2015-07-10 LAB — CBC WITH DIFFERENTIAL/PLATELET
BASO%: 1.1 % (ref 0.0–2.0)
BASOS ABS: 0 10*3/uL (ref 0.0–0.1)
EOS ABS: 0.1 10*3/uL (ref 0.0–0.5)
EOS%: 2.5 % (ref 0.0–7.0)
HCT: 35.2 % (ref 34.8–46.6)
HEMOGLOBIN: 12.2 g/dL (ref 11.6–15.9)
LYMPH%: 30.3 % (ref 14.0–49.7)
MCH: 31.3 pg (ref 25.1–34.0)
MCHC: 34.5 g/dL (ref 31.5–36.0)
MCV: 90.6 fL (ref 79.5–101.0)
MONO#: 0.3 10*3/uL (ref 0.1–0.9)
MONO%: 7.9 % (ref 0.0–14.0)
NEUT#: 1.9 10*3/uL (ref 1.5–6.5)
NEUT%: 58.2 % (ref 38.4–76.8)
PLATELETS: 248 10*3/uL (ref 145–400)
RBC: 3.89 10*6/uL (ref 3.70–5.45)
RDW: 13.2 % (ref 11.2–14.5)
WBC: 3.3 10*3/uL — ABNORMAL LOW (ref 3.9–10.3)
lymph#: 1 10*3/uL (ref 0.9–3.3)

## 2015-07-10 LAB — COMPREHENSIVE METABOLIC PANEL (CC13)
ALBUMIN: 3.5 g/dL (ref 3.5–5.0)
ALT: 13 U/L (ref 0–55)
ANION GAP: 6 meq/L (ref 3–11)
AST: 18 U/L (ref 5–34)
Alkaline Phosphatase: 117 U/L (ref 40–150)
BILIRUBIN TOTAL: 0.44 mg/dL (ref 0.20–1.20)
BUN: 14 mg/dL (ref 7.0–26.0)
CALCIUM: 10.2 mg/dL (ref 8.4–10.4)
CO2: 23 mEq/L (ref 22–29)
Chloride: 111 mEq/L — ABNORMAL HIGH (ref 98–109)
Creatinine: 0.8 mg/dL (ref 0.6–1.1)
Glucose: 102 mg/dl (ref 70–140)
Potassium: 4.3 mEq/L (ref 3.5–5.1)
Sodium: 140 mEq/L (ref 136–145)
TOTAL PROTEIN: 6.9 g/dL (ref 6.4–8.3)

## 2015-07-10 MED ORDER — SODIUM CHLORIDE 0.9 % IJ SOLN
10.0000 mL | INTRAMUSCULAR | Status: DC | PRN
  Administered 2015-07-10: 10 mL via INTRAVENOUS
  Filled 2015-07-10: qty 10

## 2015-07-10 MED ORDER — ACETAMINOPHEN 325 MG PO TABS
ORAL_TABLET | ORAL | Status: AC
  Filled 2015-07-10: qty 2

## 2015-07-10 MED ORDER — ACETAMINOPHEN 325 MG PO TABS
650.0000 mg | ORAL_TABLET | Freq: Once | ORAL | Status: AC
  Administered 2015-07-10: 650 mg via ORAL

## 2015-07-10 MED ORDER — SODIUM CHLORIDE 0.9 % IV SOLN
Freq: Once | INTRAVENOUS | Status: AC
  Administered 2015-07-10: 11:00:00 via INTRAVENOUS

## 2015-07-10 MED ORDER — SODIUM CHLORIDE 0.9 % IJ SOLN
10.0000 mL | INTRAMUSCULAR | Status: DC | PRN
  Administered 2015-07-10: 10 mL
  Filled 2015-07-10: qty 10

## 2015-07-10 MED ORDER — DIPHENHYDRAMINE HCL 25 MG PO CAPS
50.0000 mg | ORAL_CAPSULE | Freq: Once | ORAL | Status: AC
Start: 2015-07-10 — End: 2015-07-10
  Administered 2015-07-10: 50 mg via ORAL

## 2015-07-10 MED ORDER — DIPHENHYDRAMINE HCL 25 MG PO CAPS
ORAL_CAPSULE | ORAL | Status: AC
  Filled 2015-07-10: qty 2

## 2015-07-10 MED ORDER — HEPARIN SOD (PORK) LOCK FLUSH 100 UNIT/ML IV SOLN
500.0000 [IU] | Freq: Once | INTRAVENOUS | Status: AC | PRN
  Administered 2015-07-10: 500 [IU]
  Filled 2015-07-10: qty 5

## 2015-07-10 MED ORDER — TRASTUZUMAB CHEMO INJECTION 440 MG
6.0000 mg/kg | Freq: Once | INTRAVENOUS | Status: AC
  Administered 2015-07-10: 462 mg via INTRAVENOUS
  Filled 2015-07-10: qty 22

## 2015-07-10 NOTE — Progress Notes (Signed)
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 The genes tested were ATM, BARD1, BRCA1, BRCA2, BRIP1, CDH1, CHEK2, MRE11A, MUTYH, NBN, NF1, PALB2, PTEN, RAD50, RAD51C, RAD51D, and TP53.   01/30/2015 Breast MRI Left breast: Masses decrease in size from 4 cm to 2.8 cm abuts the pectoralis muscle   02/07/2015 -  Chemotherapy Herceptin maintenance therapy to complete November 2016   02/09/2015 Surgery Left breast lumpectomy: Pathologic complete response   03/13/2015 - 05/02/2015 Radiation Therapy Adjuvant radiation therapy    CHIEF COMPLIANT: Follow-up of antiestrogen therapy. Also on maintenance Herceptin.  INTERVAL HISTORY: Aimee Poole is a 44 year old with above-mentioned history of left breast cancer where a complete pathologic response to initial chemotherapy and is currently on Herceptin maintenance. She completed radiation therapy and began tamoxifen in early June. She is tolerating  this well so far. Her only complaint is increased hot flashes. Occasionally these wake her at night. She has continued leg pain, but is still not exercising regularly.   REVIEW OF SYSTEMS:   Constitutional: Denies fevers, chills or abnormal weight loss Eyes: Denies blurriness of vision Ears, nose, mouth, throat, and face: Denies mucositis or sore throat Respiratory: Denies cough, dyspnea or wheezes Cardiovascular: Denies palpitation, chest discomfort or lower extremity swelling Gastrointestinal:  Denies nausea, heartburn or change in bowel habits Skin: Denies abnormal skin rashes Lymphatics: Denies new lymphadenopathy or easy bruising Neurological:Denies numbness, tingling or new weaknesses Behavioral/Psych: Mood is stable, no new changes  Breast:  denies any pain or lumps or nodules in either breasts All other systems were reviewed with the patient and are negative.  I have reviewed the past medical history, past surgical history, social history and family history with the patient and they are unchanged from previous note.  ALLERGIES:  is allergic to corn-containing products.  MEDICATIONS:  Current Outpatient Prescriptions  Medication Sig Dispense Refill  . diphenhydramine-acetaminophen (TYLENOL PM) 25-500 MG TABS Take 1 tablet by mouth at bedtime as needed.    . Emollient (AQUAPHOR ADVANCED THERAPY) OINT Take 1 application by mouth daily. Hands and feet  0  . lidocaine-prilocaine (EMLA) cream Apply 1 application topically as needed. 30 g 3  . LORazepam (ATIVAN) 0.5 MG tablet Take by mouth daily as needed.   0  . tamoxifen (NOLVADEX) 20 MG tablet Take 1 tablet (20 mg total) by mouth daily. 90 tablet 3   No current facility-administered medications for this  visit.    PHYSICAL EXAMINATION: ECOG PERFORMANCE STATUS: 1 - Symptomatic but completely ambulatory  Filed Vitals:   07/10/15 0941  BP: 136/70  Pulse: 79  Temp: 98.6 F (37 C)  Resp: 18   Filed Weights   07/10/15 0941   Weight: 177 lb 9.6 oz (80.559 kg)    GENERAL:alert, no distress and comfortable SKIN: skin color, texture, turgor are normal, no rashes or significant lesions EYES: normal, Conjunctiva are pink and non-injected, sclera clear OROPHARYNX:no exudate, no erythema and lips, buccal mucosa, and tongue normal  NECK: supple, thyroid normal size, non-tender, without nodularity LYMPH:  no palpable lymphadenopathy in the cervical, axillary or inguinal LUNGS: clear to auscultation and percussion with normal breathing effort HEART: regular rate & rhythm and no murmurs and no lower extremity edema ABDOMEN:abdomen soft, non-tender and normal bowel sounds Musculoskeletal:no cyanosis of digits and no clubbing  NEURO: alert & oriented x 3 with fluent speech, no focal motor/sensory deficits   LABORATORY DATA:  I have reviewed the data as listed   Chemistry      Component Value Date/Time   NA 140 07/10/2015 0822   NA 139 01/24/2015 1120   K 4.3 07/10/2015 0822   K 4.2 01/24/2015 1120   CL 106 01/24/2015 1120   CO2 23 07/10/2015 0822   CO2 28 01/24/2015 1120   BUN 14.0 07/10/2015 0822   BUN 12 01/24/2015 1120   CREATININE 0.8 07/10/2015 0822   CREATININE 0.71 01/24/2015 1120      Component Value Date/Time   CALCIUM 10.2 07/10/2015 0822   CALCIUM 11.0* 01/24/2015 1120   ALKPHOS 117 07/10/2015 0822   ALKPHOS 91 11/01/2009 1212   AST 18 07/10/2015 0822   AST 22 11/01/2009 1212   ALT 13 07/10/2015 0822   ALT 22 11/01/2009 1212   BILITOT 0.44 07/10/2015 0822   BILITOT 0.6 11/01/2009 1212       Lab Results  Component Value Date   WBC 3.3* 07/10/2015   HGB 12.2 07/10/2015   HCT 35.2 07/10/2015   MCV 90.6 07/10/2015   PLT 248 07/10/2015   NEUTROABS 1.9 07/10/2015   ASSESSMENT & PLAN:  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 clinical stage ER 7% PR 0% HER-2 positive ratio 3.84 in the reconstructed  left breast. Completed 6 cycles of of TCHP, currently on Herceptin maintenance that will complete November 2016  Post neoadjuvant MRI: Decrease in size of the mass from 4 cm to 2.8 cm; Final pathology counseling: pathologic complete response based on lumpectomy March 2016  Completed radiation thearpy in June 2016 Started tamoxifen June 2016  Current treatment:  1. Herceptin maintenance once every 3 weeks  2. Tamoxifen daily   Myalgias: I discussed with her that exercise will help the muscle aches and pains. Skin changes due to radiation: Much improved Hot Flashes: will manage on own for now   Aimee Poole is tolerating the tamoxifen relatively well so far. She has some hot flashes that wake her up at night, but otherwise has no issues. We discussed implementing 334m gabapentin QHS, but she declined at this time.  Return to clinic every 3 weeks for Herceptin maintenance every 6 weeks to follow up with Dr. GLindi Adie She will be due for a repeat echocardiogram in early September, so orders were placed today.   Orders Placed This Encounter  Procedures  . ECHOCARDIOGRAM COMPLETE    Standing Status: Future  Number of Occurrences:      Standing Expiration Date: 10/09/2016    Scheduling Instructions:     trastuzumab every 3 weeks    Order Specific Question:  Where should this test be performed    Answer:  [facility]    Order Specific Question:  Complete or Limited study?    Answer:  Complete    Order Specific Question:  With Image Enhancing Agent or without Image Enhancing Agent?    Answer:  With Image Enhancing Agent    Order Specific Question:  Reason for exam-Echo    Answer:  Other - See Comments Section   The patient has a good understanding of the overall plan. she agrees with it. she will call with any problems that may develop before the next visit here.   Laurie Panda, NP

## 2015-07-10 NOTE — Patient Instructions (Signed)

## 2015-07-10 NOTE — Patient Instructions (Signed)
[facility] Discharge Instructions for Patients Receiving Chemotherapy  Today you received the following chemotherapy agents: Herceptin.  To help prevent nausea and vomiting after your treatment, we encourage you to take your nausea medication: as ordered.   If you develop nausea and vomiting that is not controlled by your nausea medication, call the clinic.   BELOW ARE SYMPTOMS THAT SHOULD BE REPORTED IMMEDIATELY:  *FEVER GREATER THAN 100.5 F  *CHILLS WITH OR WITHOUT FEVER  NAUSEA AND VOMITING THAT IS NOT CONTROLLED WITH YOUR NAUSEA MEDICATION  *UNUSUAL SHORTNESS OF BREATH  *UNUSUAL BRUISING OR BLEEDING  TENDERNESS IN MOUTH AND THROAT WITH OR WITHOUT PRESENCE OF ULCERS  *URINARY PROBLEMS  *BOWEL PROBLEMS  UNUSUAL RASH Items with * indicate a potential emergency and should be followed up as soon as possible.  Feel free to call the clinic you have any questions or concerns. The clinic phone number is [phone].  Please show the [place] at check-in to the Emergency Department and triage nurse.

## 2015-07-14 ENCOUNTER — Telehealth: Payer: Self-pay | Admitting: Hematology and Oncology

## 2015-07-14 NOTE — Telephone Encounter (Signed)
Left message to confirm appointment for Echo 09/13

## 2015-08-01 ENCOUNTER — Other Ambulatory Visit (HOSPITAL_BASED_OUTPATIENT_CLINIC_OR_DEPARTMENT_OTHER): Payer: BLUE CROSS/BLUE SHIELD

## 2015-08-01 ENCOUNTER — Ambulatory Visit: Payer: BLUE CROSS/BLUE SHIELD

## 2015-08-01 ENCOUNTER — Ambulatory Visit (HOSPITAL_BASED_OUTPATIENT_CLINIC_OR_DEPARTMENT_OTHER): Payer: BLUE CROSS/BLUE SHIELD

## 2015-08-01 VITALS — BP 123/72 | HR 81 | Temp 98.4°F | Resp 16

## 2015-08-01 DIAGNOSIS — Z5112 Encounter for antineoplastic immunotherapy: Secondary | ICD-10-CM

## 2015-08-01 DIAGNOSIS — C50412 Malignant neoplasm of upper-outer quadrant of left female breast: Secondary | ICD-10-CM

## 2015-08-01 DIAGNOSIS — C50919 Malignant neoplasm of unspecified site of unspecified female breast: Secondary | ICD-10-CM

## 2015-08-01 DIAGNOSIS — Z95828 Presence of other vascular implants and grafts: Secondary | ICD-10-CM

## 2015-08-01 LAB — CBC WITH DIFFERENTIAL/PLATELET
BASO%: 0.6 % (ref 0.0–2.0)
BASOS ABS: 0 10*3/uL (ref 0.0–0.1)
EOS ABS: 0.1 10*3/uL (ref 0.0–0.5)
EOS%: 2.5 % (ref 0.0–7.0)
HCT: 33.7 % — ABNORMAL LOW (ref 34.8–46.6)
HGB: 11.8 g/dL (ref 11.6–15.9)
LYMPH%: 31.7 % (ref 14.0–49.7)
MCH: 31.2 pg (ref 25.1–34.0)
MCHC: 35 g/dL (ref 31.5–36.0)
MCV: 89.2 fL (ref 79.5–101.0)
MONO#: 0.2 10*3/uL (ref 0.1–0.9)
MONO%: 5.5 % (ref 0.0–14.0)
NEUT#: 2.2 10*3/uL (ref 1.5–6.5)
NEUT%: 59.7 % (ref 38.4–76.8)
Platelets: 215 10*3/uL (ref 145–400)
RBC: 3.78 10*6/uL (ref 3.70–5.45)
RDW: 12.2 % (ref 11.2–14.5)
WBC: 3.6 10*3/uL — ABNORMAL LOW (ref 3.9–10.3)
lymph#: 1.2 10*3/uL (ref 0.9–3.3)

## 2015-08-01 LAB — COMPREHENSIVE METABOLIC PANEL (CC13)
ALBUMIN: 3.2 g/dL — AB (ref 3.5–5.0)
ALK PHOS: 107 U/L (ref 40–150)
ALT: 15 U/L (ref 0–55)
AST: 15 U/L (ref 5–34)
Anion Gap: 5 mEq/L (ref 3–11)
BUN: 10.8 mg/dL (ref 7.0–26.0)
CO2: 26 meq/L (ref 22–29)
Calcium: 10.7 mg/dL — ABNORMAL HIGH (ref 8.4–10.4)
Chloride: 111 mEq/L — ABNORMAL HIGH (ref 98–109)
Creatinine: 0.8 mg/dL (ref 0.6–1.1)
GLUCOSE: 98 mg/dL (ref 70–140)
POTASSIUM: 4.1 meq/L (ref 3.5–5.1)
SODIUM: 142 meq/L (ref 136–145)
Total Bilirubin: 0.22 mg/dL (ref 0.20–1.20)
Total Protein: 6.5 g/dL (ref 6.4–8.3)

## 2015-08-01 MED ORDER — DIPHENHYDRAMINE HCL 25 MG PO CAPS
50.0000 mg | ORAL_CAPSULE | Freq: Once | ORAL | Status: AC
  Administered 2015-08-01: 50 mg via ORAL

## 2015-08-01 MED ORDER — DIPHENHYDRAMINE HCL 25 MG PO CAPS
ORAL_CAPSULE | ORAL | Status: AC
  Filled 2015-08-01: qty 2

## 2015-08-01 MED ORDER — HEPARIN SOD (PORK) LOCK FLUSH 100 UNIT/ML IV SOLN
500.0000 [IU] | Freq: Once | INTRAVENOUS | Status: AC | PRN
  Administered 2015-08-01: 500 [IU]
  Filled 2015-08-01: qty 5

## 2015-08-01 MED ORDER — SODIUM CHLORIDE 0.9 % IJ SOLN
10.0000 mL | INTRAMUSCULAR | Status: DC | PRN
  Administered 2015-08-01: 10 mL via INTRAVENOUS
  Filled 2015-08-01: qty 10

## 2015-08-01 MED ORDER — ACETAMINOPHEN 325 MG PO TABS
650.0000 mg | ORAL_TABLET | Freq: Once | ORAL | Status: AC
  Administered 2015-08-01: 650 mg via ORAL

## 2015-08-01 MED ORDER — TRASTUZUMAB CHEMO INJECTION 440 MG
6.0000 mg/kg | Freq: Once | INTRAVENOUS | Status: AC
  Administered 2015-08-01: 462 mg via INTRAVENOUS
  Filled 2015-08-01: qty 22

## 2015-08-01 MED ORDER — SODIUM CHLORIDE 0.9 % IJ SOLN
10.0000 mL | INTRAMUSCULAR | Status: DC | PRN
  Administered 2015-08-01: 10 mL
  Filled 2015-08-01: qty 10

## 2015-08-01 MED ORDER — SODIUM CHLORIDE 0.9 % IV SOLN
Freq: Once | INTRAVENOUS | Status: AC
  Administered 2015-08-01: 10:00:00 via INTRAVENOUS

## 2015-08-01 MED ORDER — ACETAMINOPHEN 325 MG PO TABS
ORAL_TABLET | ORAL | Status: AC
  Filled 2015-08-01: qty 2

## 2015-08-01 NOTE — Patient Instructions (Signed)
[facility] Discharge Instructions for Patients Receiving Chemotherapy  Today you received the following chemotherapy agents:  Herceptin  To help prevent nausea and vomiting after your treatment, we encourage you to take your nausea medication as prescribed.   If you develop nausea and vomiting that is not controlled by your nausea medication, call the clinic.   BELOW ARE SYMPTOMS THAT SHOULD BE REPORTED IMMEDIATELY:  *FEVER GREATER THAN 100.5 F  *CHILLS WITH OR WITHOUT FEVER  NAUSEA AND VOMITING THAT IS NOT CONTROLLED WITH YOUR NAUSEA MEDICATION  *UNUSUAL SHORTNESS OF BREATH  *UNUSUAL BRUISING OR BLEEDING  TENDERNESS IN MOUTH AND THROAT WITH OR WITHOUT PRESENCE OF ULCERS  *URINARY PROBLEMS  *BOWEL PROBLEMS  UNUSUAL RASH Items with * indicate a potential emergency and should be followed up as soon as possible.  Feel free to call the clinic you have any questions or concerns. The clinic phone number is [phone].  Please show the CHEMO ALERT CARD at check-in to the Emergency Department and triage nurse.   

## 2015-08-01 NOTE — Progress Notes (Signed)
Pt here for Herceptin.  Stated had pain yesterday on left chest above breast - lasting approx 5 min, and pain went away.  Pt also stated had pain on left side - associated with menstrual cycle.  Denied pain today.

## 2015-08-01 NOTE — Patient Instructions (Signed)

## 2015-08-04 ENCOUNTER — Other Ambulatory Visit: Payer: Self-pay

## 2015-08-08 ENCOUNTER — Other Ambulatory Visit (HOSPITAL_COMMUNITY): Payer: BLUE CROSS/BLUE SHIELD

## 2015-08-11 ENCOUNTER — Other Ambulatory Visit: Payer: Self-pay | Admitting: Nurse Practitioner

## 2015-08-11 ENCOUNTER — Ambulatory Visit (HOSPITAL_COMMUNITY)
Admission: RE | Admit: 2015-08-11 | Discharge: 2015-08-11 | Disposition: A | Discharge status: Home / Self Care | Payer: BLUE CROSS/BLUE SHIELD | Source: Ambulatory Visit | Attending: Nurse Practitioner | Admitting: Nurse Practitioner

## 2015-08-11 DIAGNOSIS — Z01818 Encounter for other preprocedural examination: Secondary | ICD-10-CM | POA: Insufficient documentation

## 2015-08-11 DIAGNOSIS — C50412 Malignant neoplasm of upper-outer quadrant of left female breast: Secondary | ICD-10-CM | POA: Diagnosis not present

## 2015-08-11 MED ORDER — PERFLUTREN LIPID MICROSPHERE
INTRAVENOUS | Status: AC
  Filled 2015-08-11: qty 10

## 2015-08-11 MED ORDER — PERFLUTREN LIPID MICROSPHERE
1.0000 mL | INTRAVENOUS | Status: AC | PRN
  Administered 2015-08-11: 4 mL via INTRAVENOUS

## 2015-08-11 NOTE — Progress Notes (Signed)
Echocardiogram 2D Echocardiogram limited with Definity has been performed.  Tresa Res 08/11/2015, 11:01 AM

## 2015-08-20 NOTE — Assessment & Plan Note (Signed)
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 clinical stage ER 7% PR 0% HER-2 positive ratio 3.84 in the reconstructed left breast. Completed 6 cycles of of TCHP, currently on Herceptin maintenance that will complete November 2016  Post neoadjuvant MRI: Decrease in size of the mass from 4 cm to 2.8 cm; Final pathology counseling: pathologic complete response based on lumpectomy March 2016  Completed radiation thearpy in June 2016 Started tamoxifen June 2016  Current treatment:  1. Herceptin maintenance once every 3 weeks to complete Nov 2016 (ECHO 9/16: EF 55-60%) 2. Tamoxifen daily  Tamoxifen Toxicities: tolerating the tamoxifen relatively well so far.  1.Hot flashes that wake her up at night, but otherwise has no issues. We discussed implementing 38m gabapentin QHS, but she declined  Return to clinic every 3 weeks for Herceptin maintenance every 6 weeks to follow up.

## 2015-08-21 ENCOUNTER — Ambulatory Visit (HOSPITAL_BASED_OUTPATIENT_CLINIC_OR_DEPARTMENT_OTHER): Payer: BLUE CROSS/BLUE SHIELD | Admitting: Hematology and Oncology

## 2015-08-21 ENCOUNTER — Ambulatory Visit: Payer: BLUE CROSS/BLUE SHIELD

## 2015-08-21 ENCOUNTER — Other Ambulatory Visit (HOSPITAL_BASED_OUTPATIENT_CLINIC_OR_DEPARTMENT_OTHER): Payer: BLUE CROSS/BLUE SHIELD

## 2015-08-21 ENCOUNTER — Ambulatory Visit (HOSPITAL_BASED_OUTPATIENT_CLINIC_OR_DEPARTMENT_OTHER): Payer: BLUE CROSS/BLUE SHIELD

## 2015-08-21 ENCOUNTER — Encounter: Payer: Self-pay | Admitting: Hematology and Oncology

## 2015-08-21 VITALS — BP 133/79 | HR 81 | Temp 98.2°F | Resp 18 | Ht 61.5 in | Wt 183.9 lb

## 2015-08-21 DIAGNOSIS — Z5112 Encounter for antineoplastic immunotherapy: Secondary | ICD-10-CM | POA: Diagnosis not present

## 2015-08-21 DIAGNOSIS — C50412 Malignant neoplasm of upper-outer quadrant of left female breast: Secondary | ICD-10-CM

## 2015-08-21 DIAGNOSIS — C50919 Malignant neoplasm of unspecified site of unspecified female breast: Secondary | ICD-10-CM

## 2015-08-21 DIAGNOSIS — N951 Menopausal and female climacteric states: Secondary | ICD-10-CM | POA: Diagnosis not present

## 2015-08-21 DIAGNOSIS — R232 Flushing: Secondary | ICD-10-CM

## 2015-08-21 DIAGNOSIS — Z95828 Presence of other vascular implants and grafts: Secondary | ICD-10-CM

## 2015-08-21 LAB — COMPREHENSIVE METABOLIC PANEL (CC13)
ALT: 11 U/L (ref 0–55)
AST: 17 U/L (ref 5–34)
Albumin: 3.2 g/dL — ABNORMAL LOW (ref 3.5–5.0)
Alkaline Phosphatase: 122 U/L (ref 40–150)
Anion Gap: 6 mEq/L (ref 3–11)
BUN: 11.9 mg/dL (ref 7.0–26.0)
CHLORIDE: 110 meq/L — AB (ref 98–109)
CO2: 24 meq/L (ref 22–29)
CREATININE: 0.7 mg/dL (ref 0.6–1.1)
Calcium: 10 mg/dL (ref 8.4–10.4)
EGFR: >90 mL/min/{1.73_m2} (ref >90)
GLUCOSE: 97 mg/dL (ref 70–140)
POTASSIUM: 4 meq/L (ref 3.5–5.1)
SODIUM: 141 meq/L (ref 136–145)
Total Bilirubin: <0.3 mg/dL (ref 0.20–1.20)
Total Protein: 6.5 g/dL (ref 6.4–8.3)

## 2015-08-21 LAB — CBC WITH DIFFERENTIAL/PLATELET
BASO%: 1 % (ref 0.0–2.0)
BASOS ABS: 0 10*3/uL (ref 0.0–0.1)
EOS%: 3.6 % (ref 0.0–7.0)
Eosinophils Absolute: 0.1 10*3/uL (ref 0.0–0.5)
HEMATOCRIT: 35.7 % (ref 34.8–46.6)
HGB: 12.3 g/dL (ref 11.6–15.9)
LYMPH#: 1.2 10*3/uL (ref 0.9–3.3)
LYMPH%: 35.2 % (ref 14.0–49.7)
MCH: 31 pg (ref 25.1–34.0)
MCHC: 34.4 g/dL (ref 31.5–36.0)
MCV: 90.2 fL (ref 79.5–101.0)
MONO#: 0.2 10*3/uL (ref 0.1–0.9)
MONO%: 7.1 % (ref 0.0–14.0)
NEUT#: 1.8 10*3/uL (ref 1.5–6.5)
NEUT%: 53.1 % (ref 38.4–76.8)
Platelets: 227 10*3/uL (ref 145–400)
RBC: 3.95 10*6/uL (ref 3.70–5.45)
RDW: 12.5 % (ref 11.2–14.5)
WBC: 3.4 10*3/uL — ABNORMAL LOW (ref 3.9–10.3)

## 2015-08-21 MED ORDER — HEPARIN SOD (PORK) LOCK FLUSH 100 UNIT/ML IV SOLN
500.0000 [IU] | Freq: Once | INTRAVENOUS | Status: AC | PRN
Start: 2015-08-21 — End: 2015-08-21
  Administered 2015-08-21: 500 [IU]
  Filled 2015-08-21: qty 5

## 2015-08-21 MED ORDER — SODIUM CHLORIDE 0.9 % IJ SOLN
10.0000 mL | INTRAMUSCULAR | Status: DC | PRN
  Administered 2015-08-21: 10 mL via INTRAVENOUS
  Filled 2015-08-21: qty 10

## 2015-08-21 MED ORDER — SODIUM CHLORIDE 0.9 % IJ SOLN
10.0000 mL | INTRAMUSCULAR | Status: DC | PRN
  Administered 2015-08-21: 10 mL
  Filled 2015-08-21: qty 10

## 2015-08-21 MED ORDER — ACETAMINOPHEN 325 MG PO TABS
650.0000 mg | ORAL_TABLET | Freq: Once | ORAL | Status: AC
  Administered 2015-08-21: 650 mg via ORAL

## 2015-08-21 MED ORDER — SODIUM CHLORIDE 0.9 % IV SOLN
Freq: Once | INTRAVENOUS | Status: AC
  Administered 2015-08-21: 10:00:00 via INTRAVENOUS

## 2015-08-21 MED ORDER — ACETAMINOPHEN 325 MG PO TABS
ORAL_TABLET | ORAL | Status: AC
  Filled 2015-08-21: qty 2

## 2015-08-21 MED ORDER — HEPARIN SOD (PORK) LOCK FLUSH 100 UNIT/ML IV SOLN
500.0000 [IU] | Freq: Once | INTRAVENOUS | Status: DC
  Filled 2015-08-21: qty 5

## 2015-08-21 MED ORDER — DIPHENHYDRAMINE HCL 25 MG PO CAPS
ORAL_CAPSULE | ORAL | Status: AC
  Filled 2015-08-21: qty 2

## 2015-08-21 MED ORDER — SODIUM CHLORIDE 0.9 % IV SOLN
6.0000 mg/kg | Freq: Once | INTRAVENOUS | Status: AC
  Administered 2015-08-21: 462 mg via INTRAVENOUS
  Filled 2015-08-21: qty 22

## 2015-08-21 MED ORDER — DIPHENHYDRAMINE HCL 25 MG PO CAPS
50.0000 mg | ORAL_CAPSULE | Freq: Once | ORAL | Status: AC
  Administered 2015-08-21: 50 mg via ORAL

## 2015-08-21 NOTE — Progress Notes (Signed)
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 The genes tested were ATM, BARD1, BRCA1, BRCA2, BRIP1, CDH1, CHEK2, MRE11A, MUTYH, NBN, NF1, PALB2, PTEN, RAD50, RAD51C, RAD51D, and TP53.   01/30/2015 Breast MRI Left breast: Masses decrease in size from 4 cm to 2.8 cm abuts the pectoralis muscle   02/07/2015 -  Chemotherapy Herceptin maintenance therapy to complete November 2016   02/09/2015 Surgery Left breast lumpectomy: Pathologic complete response   03/13/2015 - 05/02/2015 Radiation Therapy Adjuvant radiation therapy   05/30/2015 -  Anti-estrogen oral therapy Tamoxifen 20 mg daily    CHIEF COMPLIANT:  Herceptin maintenance  INTERVAL HISTORY: Aimee Poole is a  44 year old with above-mentioned history of left breast cancer currently on adjuvant maintenance Herceptin. He is tolerating it extremely well without any major problems or concerns. She was started on tamoxifen in July. He reports no  problems tolerating tamoxifen either. She does have hot flashes with the day and night.  Denies any fatigue or myalgias. Her energy levels are back to normal. Appetite is also back to normal but she has not been eating as well as she should.  REVIEW OF SYSTEMS:   Constitutional: Denies fevers, chills or abnormal weight loss Eyes: Denies blurriness of vision Ears, nose, mouth, throat, and face: Denies mucositis or sore throat Respiratory: Denies cough, dyspnea or wheezes Cardiovascular: Denies palpitation, chest discomfort or lower extremity swelling Gastrointestinal:  Denies nausea, heartburn or change in bowel habits Skin: Denies abnormal skin rashes Lymphatics: Denies new lymphadenopathy or easy bruising Neurological:Denies numbness, tingling or new weaknesses Behavioral/Psych: Mood is stable, no new changes  All other systems were reviewed with the patient and are negative.  I have reviewed the past medical history, past surgical history, social history and family history with the patient and they are unchanged from previous note.  ALLERGIES:  is allergic to corn-containing products.  MEDICATIONS:  Current Outpatient Prescriptions  Medication Sig Dispense Refill  . diphenhydramine-acetaminophen (TYLENOL PM) 25-500 MG TABS Take 1 tablet by mouth at bedtime as needed.    . Emollient (AQUAPHOR ADVANCED THERAPY) OINT Take 1 application by mouth daily. Hands and feet  0  . lidocaine-prilocaine (EMLA) cream Apply 1 application topically as needed. 30 g 3  . LORazepam (ATIVAN) 0.5 MG tablet Take by mouth daily as needed.   0  . tamoxifen (NOLVADEX) 20 MG tablet Take 1 tablet (20 mg total) by mouth daily. 90 tablet 3  No current facility-administered medications for this visit.    PHYSICAL EXAMINATION: ECOG PERFORMANCE STATUS: 1 - Symptomatic but completely ambulatory  Filed Vitals:   08/21/15 0915  BP: 133/79  Pulse: 81  Temp: 98.2 F (36.8 C)  Resp: 18   Filed Weights    08/21/15 0915  Weight: 183 lb 14.4 oz (83.416 kg)    GENERAL:alert, no distress and comfortable SKIN: skin color, texture, turgor are normal, no rashes or significant lesions EYES: normal, Conjunctiva are pink and non-injected, sclera clear OROPHARYNX:no exudate, no erythema and lips, buccal mucosa, and tongue normal  NECK: supple, thyroid normal size, non-tender, without nodularity LYMPH:  no palpable lymphadenopathy in the cervical, axillary or inguinal LUNGS: clear to auscultation and percussion with normal breathing effort HEART: regular rate & rhythm and no murmurs and no lower extremity edema ABDOMEN:abdomen soft, non-tender and normal bowel sounds Musculoskeletal:no cyanosis of digits and no clubbing  NEURO: alert & oriented x 3 with fluent speech, no focal motor/sensory deficits  LABORATORY DATA:  I have reviewed the data as listed   Chemistry      Component Value Date/Time   NA 142 08/01/2015 0839   NA 139 01/24/2015 1120   K 4.1 08/01/2015 0839   K 4.2 01/24/2015 1120   CL 106 01/24/2015 1120   CO2 26 08/01/2015 0839   CO2 28 01/24/2015 1120   BUN 10.8 08/01/2015 0839   BUN 12 01/24/2015 1120   CREATININE 0.8 08/01/2015 0839   CREATININE 0.71 01/24/2015 1120      Component Value Date/Time   CALCIUM 10.7* 08/01/2015 0839   CALCIUM 11.0* 01/24/2015 1120   ALKPHOS 107 08/01/2015 0839   ALKPHOS 91 11/01/2009 1212   AST 15 08/01/2015 0839   AST 22 11/01/2009 1212   ALT 15 08/01/2015 0839   ALT 22 11/01/2009 1212   BILITOT 0.22 08/01/2015 0839   BILITOT 0.6 11/01/2009 1212       Lab Results  Component Value Date   WBC 3.4* 08/21/2015   HGB 12.3 08/21/2015   HCT 35.7 08/21/2015   MCV 90.2 08/21/2015   PLT 227 08/21/2015   NEUTROABS 1.8 08/21/2015   ASSESSMENT & PLAN:  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 clinical stage ER 7% PR 0% HER-2 positive ratio 3.84 in the  reconstructed left breast. Completed 6 cycles of of TCHP, currently on Herceptin maintenance that will complete November 2016  Post neoadjuvant MRI: Decrease in size of the mass from 4 cm to 2.8 cm; Final pathology counseling: pathologic complete response based on lumpectomy March 2016  Completed radiation thearpy in June 2016 Started tamoxifen June 2016  Current treatment:  1. Herceptin maintenance once every 3 weeks to complete Nov 7th 2016 (ECHO 9/16: EF 55-60%) 2. Tamoxifen daily   Tamoxifen Toxicities: tolerating the tamoxifen relatively well so far.  1.Hot flashes that wake her up at night, but otherwise has no issues.   Breast cancer surveillance: patient will need a mammogram in October. I sent an order to breast center. Return to clinic every 3 weeks for Herceptin maintenance every 6 weeks to follow up.  After that we'll be seeing her every 6 months for follow-up.   No orders of the defined types were placed in this encounter.   The patient has a good understanding of the overall plan. she agrees with it. she will call with any problems that may develop before  the next visit here.   Rulon Eisenmenger, MD

## 2015-08-21 NOTE — Patient Instructions (Signed)
[facility] Discharge Instructions for Patients Receiving Chemotherapy  Today you received the following chemotherapy agents: Herceptin   To help prevent nausea and vomiting after your treatment, we encourage you to take your nausea medication as directed.    If you develop nausea and vomiting that is not controlled by your nausea medication, call the clinic.   BELOW ARE SYMPTOMS THAT SHOULD BE REPORTED IMMEDIATELY:  *FEVER GREATER THAN 100.5 F  *CHILLS WITH OR WITHOUT FEVER  NAUSEA AND VOMITING THAT IS NOT CONTROLLED WITH YOUR NAUSEA MEDICATION  *UNUSUAL SHORTNESS OF BREATH  *UNUSUAL BRUISING OR BLEEDING  TENDERNESS IN MOUTH AND THROAT WITH OR WITHOUT PRESENCE OF ULCERS  *URINARY PROBLEMS  *BOWEL PROBLEMS  UNUSUAL RASH Items with * indicate a potential emergency and should be followed up as soon as possible.  Feel free to call the clinic you have any questions or concerns. The clinic phone number is [phone].  Please show the CHEMO ALERT CARD at check-in to the Emergency Department and triage nurse.   

## 2015-08-22 ENCOUNTER — Telehealth: Payer: Self-pay | Admitting: Hematology and Oncology

## 2015-08-22 NOTE — Telephone Encounter (Signed)
Called and left a message with 10/17 appointments and for her to call for her mammo due to hold time

## 2015-09-10 NOTE — Assessment & Plan Note (Signed)
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 clinical stage ER 7% PR 0% HER-2 positive ratio 3.84 in the reconstructed left breast. Completed 6 cycles of of TCHP, currently on Herceptin maintenance that will complete November 2016  Post neoadjuvant MRI: Decrease in size of the mass from 4 cm to 2.8 cm; Final pathology counseling: pathologic complete response based on lumpectomy March 2016  Completed radiation thearpy in June 2016 Started tamoxifen June 2016  Current treatment:  1. Herceptin maintenance once every 3 weeks to complete Nov 7th 2016 (ECHO 9/16: EF 55-60%) 2. Tamoxifen daily   Tamoxifen Toxicities: tolerating the tamoxifen relatively well so far.  1.Hot flashes that wake her up at night, but otherwise has no issues.   Breast cancer surveillance: patient will need a mammogram in October. I sent an order to breast center. Return to clinic every 3 weeks for Herceptin maintenance every 6 weeks to follow up. After that we'll be seeing her every 6 months for follow-up.

## 2015-09-11 ENCOUNTER — Ambulatory Visit (HOSPITAL_BASED_OUTPATIENT_CLINIC_OR_DEPARTMENT_OTHER): Payer: BLUE CROSS/BLUE SHIELD

## 2015-09-11 ENCOUNTER — Other Ambulatory Visit (HOSPITAL_BASED_OUTPATIENT_CLINIC_OR_DEPARTMENT_OTHER): Payer: BLUE CROSS/BLUE SHIELD

## 2015-09-11 ENCOUNTER — Ambulatory Visit: Payer: BLUE CROSS/BLUE SHIELD

## 2015-09-11 ENCOUNTER — Ambulatory Visit (HOSPITAL_BASED_OUTPATIENT_CLINIC_OR_DEPARTMENT_OTHER): Payer: BLUE CROSS/BLUE SHIELD | Admitting: Hematology and Oncology

## 2015-09-11 ENCOUNTER — Telehealth: Payer: Self-pay | Admitting: Hematology and Oncology

## 2015-09-11 ENCOUNTER — Encounter: Payer: Self-pay | Admitting: Hematology and Oncology

## 2015-09-11 VITALS — BP 143/80 | HR 97 | Temp 98.6°F | Resp 18 | Ht 61.5 in | Wt 186.4 lb

## 2015-09-11 DIAGNOSIS — C50412 Malignant neoplasm of upper-outer quadrant of left female breast: Secondary | ICD-10-CM | POA: Diagnosis not present

## 2015-09-11 DIAGNOSIS — Z95828 Presence of other vascular implants and grafts: Secondary | ICD-10-CM

## 2015-09-11 DIAGNOSIS — N951 Menopausal and female climacteric states: Secondary | ICD-10-CM | POA: Diagnosis not present

## 2015-09-11 DIAGNOSIS — Z5112 Encounter for antineoplastic immunotherapy: Secondary | ICD-10-CM

## 2015-09-11 DIAGNOSIS — C50919 Malignant neoplasm of unspecified site of unspecified female breast: Secondary | ICD-10-CM

## 2015-09-11 LAB — COMPREHENSIVE METABOLIC PANEL (CC13)
ALBUMIN: 3.5 g/dL (ref 3.5–5.0)
ALK PHOS: 125 U/L (ref 40–150)
ALT: 11 U/L (ref 0–55)
AST: 16 U/L (ref 5–34)
Anion Gap: 8 mEq/L (ref 3–11)
BUN: 9.1 mg/dL (ref 7.0–26.0)
CALCIUM: 9.9 mg/dL (ref 8.4–10.4)
CO2: 26 mEq/L (ref 22–29)
CREATININE: 0.7 mg/dL (ref 0.6–1.1)
Chloride: 108 mEq/L (ref 98–109)
EGFR: >90 mL/min/{1.73_m2} (ref >90)
Glucose: 88 mg/dl (ref 70–140)
Potassium: 3.6 mEq/L (ref 3.5–5.1)
Sodium: 142 mEq/L (ref 136–145)
Total Protein: 7.1 g/dL (ref 6.4–8.3)

## 2015-09-11 LAB — CBC WITH DIFFERENTIAL/PLATELET
BASO%: 1.1 % (ref 0.0–2.0)
Basophils Absolute: 0 10*3/uL (ref 0.0–0.1)
EOS%: 2.4 % (ref 0.0–7.0)
Eosinophils Absolute: 0.1 10*3/uL (ref 0.0–0.5)
HEMATOCRIT: 36.1 % (ref 34.8–46.6)
HGB: 12.4 g/dL (ref 11.6–15.9)
LYMPH#: 1.3 10*3/uL (ref 0.9–3.3)
LYMPH%: 30.4 % (ref 14.0–49.7)
MCH: 30.5 pg (ref 25.1–34.0)
MCHC: 34.3 g/dL (ref 31.5–36.0)
MCV: 89 fL (ref 79.5–101.0)
MONO#: 0.2 10*3/uL (ref 0.1–0.9)
MONO%: 5.5 % (ref 0.0–14.0)
NEUT%: 60.6 % (ref 38.4–76.8)
NEUTROS ABS: 2.6 10*3/uL (ref 1.5–6.5)
PLATELETS: 264 10*3/uL (ref 145–400)
RBC: 4.05 10*6/uL (ref 3.70–5.45)
RDW: 12.2 % (ref 11.2–14.5)
WBC: 4.3 10*3/uL (ref 3.9–10.3)

## 2015-09-11 MED ORDER — ACETAMINOPHEN 325 MG PO TABS
ORAL_TABLET | ORAL | Status: AC
  Filled 2015-09-11: qty 2

## 2015-09-11 MED ORDER — SODIUM CHLORIDE 0.9 % IV SOLN
Freq: Once | INTRAVENOUS | Status: AC
  Administered 2015-09-11: 15:00:00 via INTRAVENOUS

## 2015-09-11 MED ORDER — SODIUM CHLORIDE 0.9 % IJ SOLN
10.0000 mL | INTRAMUSCULAR | Status: DC | PRN
  Administered 2015-09-11: 10 mL via INTRAVENOUS
  Filled 2015-09-11: qty 10

## 2015-09-11 MED ORDER — TRASTUZUMAB CHEMO INJECTION 440 MG
6.0000 mg/kg | Freq: Once | INTRAVENOUS | Status: AC
  Administered 2015-09-11: 462 mg via INTRAVENOUS
  Filled 2015-09-11: qty 22

## 2015-09-11 MED ORDER — DIPHENHYDRAMINE HCL 25 MG PO CAPS
ORAL_CAPSULE | ORAL | Status: AC
  Filled 2015-09-11: qty 1

## 2015-09-11 MED ORDER — SODIUM CHLORIDE 0.9 % IJ SOLN
10.0000 mL | INTRAMUSCULAR | Status: DC | PRN
  Administered 2015-09-11: 10 mL
  Filled 2015-09-11: qty 10

## 2015-09-11 MED ORDER — ACETAMINOPHEN 325 MG PO TABS
650.0000 mg | ORAL_TABLET | Freq: Once | ORAL | Status: AC
  Administered 2015-09-11: 650 mg via ORAL

## 2015-09-11 MED ORDER — DIPHENHYDRAMINE HCL 25 MG PO CAPS
50.0000 mg | ORAL_CAPSULE | Freq: Once | ORAL | Status: AC
  Administered 2015-09-11: 25 mg via ORAL

## 2015-09-11 MED ORDER — HEPARIN SOD (PORK) LOCK FLUSH 100 UNIT/ML IV SOLN
500.0000 [IU] | Freq: Once | INTRAVENOUS | Status: AC | PRN
  Administered 2015-09-11: 500 [IU]
  Filled 2015-09-11: qty 5

## 2015-09-11 NOTE — Telephone Encounter (Signed)
Appointments complete per 10/17 pof. Patient to get schedule in inf area.

## 2015-09-11 NOTE — Patient Instructions (Signed)
[facility] Discharge Instructions for Patients Receiving Chemotherapy  Today you received the following chemotherapy agents herceptin  To help prevent nausea and vomiting after your treatment, we encourage you to take your nausea medication  If you develop nausea and vomiting that is not controlled by your nausea medication, call the clinic.   BELOW ARE SYMPTOMS THAT SHOULD BE REPORTED IMMEDIATELY:  *FEVER GREATER THAN 100.5 F  *CHILLS WITH OR WITHOUT FEVER  NAUSEA AND VOMITING THAT IS NOT CONTROLLED WITH YOUR NAUSEA MEDICATION  *UNUSUAL SHORTNESS OF BREATH  *UNUSUAL BRUISING OR BLEEDING  TENDERNESS IN MOUTH AND THROAT WITH OR WITHOUT PRESENCE OF ULCERS  *URINARY PROBLEMS  *BOWEL PROBLEMS  UNUSUAL RASH Items with * indicate a potential emergency and should be followed up as soon as possible.  Feel free to call the clinic you have any questions or concerns. The clinic phone number is [phone].  Please show the CHEMO ALERT CARD at check-in to the Emergency Department and triage nurse.   

## 2015-09-11 NOTE — Progress Notes (Signed)
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 The genes tested were ATM, BARD1, BRCA1, BRCA2, BRIP1, CDH1, CHEK2, MRE11A, MUTYH, NBN, NF1, PALB2, PTEN, RAD50, RAD51C, RAD51D, and TP53.   01/30/2015 Breast MRI Left breast: Masses decrease in size from 4 cm to 2.8 cm abuts the pectoralis muscle   02/07/2015 -  Chemotherapy Herceptin maintenance therapy to complete November 2016   02/09/2015 Surgery Left breast lumpectomy: Pathologic complete response   03/13/2015 - 05/02/2015 Radiation Therapy Adjuvant radiation therapy   05/30/2015 -  Anti-estrogen oral therapy Tamoxifen 20 mg daily    CHIEF COMPLIANT:  Follow-up on Herceptin and tamoxifen   INTERVAL HISTORY: Aimee Poole is a  44 year old with above-mentioned history  Of left breast cancer currently on Herceptin maintenance. She has no symptoms or side effects of Herceptin. She will finish her last dose of Herceptin November 7. She  is tolerating tamoxifen extremely well. Occasional fatigue and occasional mild nausea. Hot flashes are related to tamoxifen and at most prominent at night.  REVIEW OF SYSTEMS:   Constitutional: Denies fevers, chills or abnormal weight loss Eyes: Denies blurriness of vision Ears, nose, mouth, throat, and face: Denies mucositis or sore throat Respiratory: Denies cough, dyspnea or wheezes Cardiovascular: Denies palpitation, chest discomfort or lower extremity swelling Gastrointestinal:  Denies nausea, heartburn or change in bowel habits Skin: Denies abnormal skin rashes Lymphatics: Denies new lymphadenopathy or easy bruising Neurological:Denies numbness, tingling or new weaknesses Behavioral/Psych: Mood is stable, no new changes  Breast:  denies any pain or lumps or nodules in either breasts All other systems were reviewed with the patient and are negative.  I have reviewed the past medical history, past surgical history, social history and family history with the patient and they are unchanged from previous note.  ALLERGIES:  is allergic to corn-containing products.  MEDICATIONS:  Current Outpatient Prescriptions  Medication Sig Dispense Refill  . diphenhydramine-acetaminophen (TYLENOL PM) 25-500 MG TABS Take 1 tablet by mouth at bedtime as needed.    . Emollient (AQUAPHOR ADVANCED THERAPY) OINT Take 1 application by mouth daily. Hands and feet  0  . lidocaine-prilocaine (EMLA) cream Apply 1 application topically as needed. 30 g 3  . LORazepam (ATIVAN) 0.5 MG tablet Take by mouth daily as needed.   0  . tamoxifen (NOLVADEX) 20 MG tablet Take 1 tablet (20 mg total) by mouth daily. 90 tablet 3   No current facility-administered medications for  this visit.    PHYSICAL EXAMINATION: ECOG PERFORMANCE STATUS: 1 - Symptomatic but completely ambulatory  Filed Vitals:   09/11/15 1320  BP: 143/80  Pulse: 97  Temp: 98.6 F (37 C)  Resp: 18   Filed Weights   09/11/15 1320  Weight: 186  lb 6.4 oz (84.55 kg)    GENERAL:alert, no distress and comfortable SKIN: skin color, texture, turgor are normal, no rashes or significant lesions EYES: normal, Conjunctiva are pink and non-injected, sclera clear OROPHARYNX:no exudate, no erythema and lips, buccal mucosa, and tongue normal  NECK: supple, thyroid normal size, non-tender, without nodularity LYMPH:  no palpable lymphadenopathy in the cervical, axillary or inguinal LUNGS: clear to auscultation and percussion with normal breathing effort HEART: regular rate & rhythm and no murmurs and no lower extremity edema ABDOMEN:abdomen soft, non-tender and normal bowel sounds Musculoskeletal:no cyanosis of digits and no clubbing  NEURO: alert & oriented x 3 with fluent speech, no focal motor/sensory deficits BREAST: No palpable masses or nodules in either right or left breasts. No palpable axillary supraclavicular or infraclavicular adenopathy no breast tenderness or nipple discharge. (exam performed in the presence of a chaperone)  LABORATORY DATA:  I have reviewed the data as listed   Chemistry      Component Value Date/Time   NA 142 09/11/2015 1251   NA 139 01/24/2015 1120   K 3.6 09/11/2015 1251   K 4.2 01/24/2015 1120   CL 106 01/24/2015 1120   CO2 26 09/11/2015 1251   CO2 28 01/24/2015 1120   BUN 9.1 09/11/2015 1251   BUN 12 01/24/2015 1120   CREATININE 0.7 09/11/2015 1251   CREATININE 0.71 01/24/2015 1120      Component Value Date/Time   CALCIUM 9.9 09/11/2015 1251   CALCIUM 11.0* 01/24/2015 1120   ALKPHOS 125 09/11/2015 1251   ALKPHOS 91 11/01/2009 1212   AST 16 09/11/2015 1251   AST 22 11/01/2009 1212   ALT 11 09/11/2015 1251   ALT 22 11/01/2009 1212   BILITOT <0.30 09/11/2015 1251   BILITOT 0.6 11/01/2009 1212       Lab Results  Component Value Date   WBC 4.3 09/11/2015   HGB 12.4 09/11/2015   HCT 36.1 09/11/2015   MCV 89.0 09/11/2015   PLT 264 09/11/2015   NEUTROABS 2.6 09/11/2015   ASSESSMENT &  PLAN:  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 clinical stage ER 7% PR 0% HER-2 positive ratio 3.84 in the reconstructed left breast. Completed 6 cycles of of TCHP, currently on Herceptin maintenance that will complete November 2016  Post neoadjuvant MRI: Decrease in size of the mass from 4 cm to 2.8 cm; Final pathology counseling: pathologic complete response based on lumpectomy March 2016  Completed radiation thearpy in June 2016 Started tamoxifen June 2016  Current treatment:  1. Herceptin maintenance once every 3 weeks to complete Nov 7th 2016 (ECHO 9/16: EF 55-60%) 2. Tamoxifen daily   Tamoxifen Toxicities: tolerating the tamoxifen relatively well so far.  1.Hot flashes that wake her up at night, but otherwise has no issues.   Breast cancer surveillance:  Mammogram appointment and 24 2016 RTC 6 months for follow-up.   No orders of the defined types were placed in this encounter.   The patient has a good understanding of the overall plan. she agrees with it. she will call with any problems that may develop before the next  visit here.   Rulon Eisenmenger, MD 09/11/2015

## 2015-09-11 NOTE — Patient Instructions (Signed)

## 2015-09-11 NOTE — Addendum Note (Signed)
Addended by: Prentiss Bells on: 09/11/2015 05:15 PM   Modules accepted: Medications

## 2015-09-18 ENCOUNTER — Ambulatory Visit
Admission: RE | Admit: 2015-09-18 | Discharge: 2015-09-18 | Disposition: A | Discharge status: Home / Self Care | Payer: BLUE CROSS/BLUE SHIELD | Source: Ambulatory Visit | Attending: Hematology and Oncology | Admitting: Hematology and Oncology

## 2015-09-18 DIAGNOSIS — C50412 Malignant neoplasm of upper-outer quadrant of left female breast: Secondary | ICD-10-CM

## 2015-09-29 ENCOUNTER — Other Ambulatory Visit: Payer: Self-pay | Admitting: Hematology and Oncology

## 2015-10-01 ENCOUNTER — Other Ambulatory Visit: Payer: Self-pay | Admitting: Hematology and Oncology

## 2015-10-02 ENCOUNTER — Other Ambulatory Visit: Payer: Self-pay | Admitting: *Deleted

## 2015-10-02 ENCOUNTER — Ambulatory Visit (HOSPITAL_BASED_OUTPATIENT_CLINIC_OR_DEPARTMENT_OTHER): Payer: BLUE CROSS/BLUE SHIELD

## 2015-10-02 VITALS — BP 136/76 | HR 86 | Temp 98.1°F | Resp 17

## 2015-10-02 DIAGNOSIS — C50412 Malignant neoplasm of upper-outer quadrant of left female breast: Secondary | ICD-10-CM

## 2015-10-02 DIAGNOSIS — Z5112 Encounter for antineoplastic immunotherapy: Secondary | ICD-10-CM

## 2015-10-02 MED ORDER — SODIUM CHLORIDE 0.9 % IJ SOLN
10.0000 mL | INTRAMUSCULAR | Status: DC | PRN
  Administered 2015-10-02: 10 mL
  Filled 2015-10-02: qty 10

## 2015-10-02 MED ORDER — SODIUM CHLORIDE 0.9 % IV SOLN
6.0000 mg/kg | Freq: Once | INTRAVENOUS | Status: AC
  Administered 2015-10-02: 462 mg via INTRAVENOUS
  Filled 2015-10-02: qty 22

## 2015-10-02 MED ORDER — TAMOXIFEN CITRATE 20 MG PO TABS: 20.0000 mg | ORAL_TABLET | Freq: Every day | ORAL | Status: DC

## 2015-10-02 MED ORDER — ACETAMINOPHEN 325 MG PO TABS
ORAL_TABLET | ORAL | Status: AC
  Filled 2015-10-02: qty 2

## 2015-10-02 MED ORDER — DIPHENHYDRAMINE HCL 25 MG PO CAPS
50.0000 mg | ORAL_CAPSULE | Freq: Once | ORAL | Status: AC
  Administered 2015-10-02: 25 mg via ORAL

## 2015-10-02 MED ORDER — DIPHENHYDRAMINE HCL 25 MG PO CAPS
ORAL_CAPSULE | ORAL | Status: AC
  Filled 2015-10-02: qty 1

## 2015-10-02 MED ORDER — SODIUM CHLORIDE 0.9 % IV SOLN
Freq: Once | INTRAVENOUS | Status: AC
  Administered 2015-10-02: 10:00:00 via INTRAVENOUS

## 2015-10-02 MED ORDER — ACETAMINOPHEN 325 MG PO TABS
650.0000 mg | ORAL_TABLET | Freq: Once | ORAL | Status: AC
  Administered 2015-10-02: 650 mg via ORAL

## 2015-10-02 MED ORDER — HEPARIN SOD (PORK) LOCK FLUSH 100 UNIT/ML IV SOLN
500.0000 [IU] | Freq: Once | INTRAVENOUS | Status: AC | PRN
  Administered 2015-10-02: 500 [IU]
  Filled 2015-10-02: qty 5

## 2015-10-02 NOTE — Patient Instructions (Signed)
[facility] Discharge Instructions for Patients Receiving Chemotherapy  Today you received the following chemotherapy agents: Herceptin   To help prevent nausea and vomiting after your treatment, we encourage you to take your nausea medication as directed.    If you develop nausea and vomiting that is not controlled by your nausea medication, call the clinic.   BELOW ARE SYMPTOMS THAT SHOULD BE REPORTED IMMEDIATELY:  *FEVER GREATER THAN 100.5 F  *CHILLS WITH OR WITHOUT FEVER  NAUSEA AND VOMITING THAT IS NOT CONTROLLED WITH YOUR NAUSEA MEDICATION  *UNUSUAL SHORTNESS OF BREATH  *UNUSUAL BRUISING OR BLEEDING  TENDERNESS IN MOUTH AND THROAT WITH OR WITHOUT PRESENCE OF ULCERS  *URINARY PROBLEMS  *BOWEL PROBLEMS  UNUSUAL RASH Items with * indicate a potential emergency and should be followed up as soon as possible.  Feel free to call the clinic you have any questions or concerns. The clinic phone number is [phone].  Please show the CHEMO ALERT CARD at check-in to the Emergency Department and triage nurse.   

## 2015-10-12 ENCOUNTER — Other Ambulatory Visit: Payer: Self-pay

## 2015-10-12 DIAGNOSIS — C50412 Malignant neoplasm of upper-outer quadrant of left female breast: Secondary | ICD-10-CM

## 2015-10-30 ENCOUNTER — Other Ambulatory Visit: Payer: Self-pay | Admitting: Hematology and Oncology

## 2015-10-30 NOTE — Telephone Encounter (Signed)
Chart reviewed.

## 2015-12-01 ENCOUNTER — Other Ambulatory Visit: Payer: Self-pay | Admitting: Radiology

## 2015-12-04 ENCOUNTER — Ambulatory Visit (HOSPITAL_COMMUNITY)
Admission: RE | Admit: 2015-12-04 | Discharge: 2015-12-04 | Disposition: A | Discharge status: Home / Self Care | Payer: BLUE CROSS/BLUE SHIELD | Source: Ambulatory Visit | Attending: Hematology and Oncology | Admitting: Hematology and Oncology

## 2015-12-04 DIAGNOSIS — Z452 Encounter for adjustment and management of vascular access device: Secondary | ICD-10-CM | POA: Diagnosis present

## 2015-12-04 DIAGNOSIS — Z9221 Personal history of antineoplastic chemotherapy: Secondary | ICD-10-CM | POA: Insufficient documentation

## 2015-12-04 DIAGNOSIS — Z853 Personal history of malignant neoplasm of breast: Secondary | ICD-10-CM | POA: Diagnosis not present

## 2015-12-04 DIAGNOSIS — C50412 Malignant neoplasm of upper-outer quadrant of left female breast: Secondary | ICD-10-CM

## 2015-12-04 LAB — CBC WITH DIFFERENTIAL/PLATELET
BASOS ABS: 0.1 10*3/uL (ref 0.0–0.1)
Basophils Relative: 1 %
EOS PCT: 1 %
Eosinophils Absolute: 0.1 10*3/uL (ref 0.0–0.7)
HCT: 37.6 % (ref 36.0–46.0)
HEMOGLOBIN: 13.2 g/dL (ref 12.0–15.0)
LYMPHS PCT: 38 %
Lymphs Abs: 1.8 10*3/uL (ref 0.7–4.0)
MCH: 30.9 pg (ref 26.0–34.0)
MCHC: 35.1 g/dL (ref 30.0–36.0)
MCV: 88.1 fL (ref 78.0–100.0)
Monocytes Absolute: 0.2 10*3/uL (ref 0.1–1.0)
Monocytes Relative: 5 %
NEUTROS PCT: 55 %
Neutro Abs: 2.6 10*3/uL (ref 1.7–7.7)
Platelets: 261 10*3/uL (ref 150–400)
RBC: 4.27 MIL/uL (ref 3.87–5.11)
RDW: 12.4 % (ref 11.5–15.5)
WBC: 4.7 10*3/uL (ref 4.0–10.5)

## 2015-12-04 LAB — BASIC METABOLIC PANEL
ANION GAP: 9 (ref 5–15)
BUN: 11 mg/dL (ref 6–20)
CO2: 22 mmol/L (ref 22–32)
Calcium: 9.3 mg/dL (ref 8.9–10.3)
Chloride: 108 mmol/L (ref 101–111)
Creatinine, Ser: 0.58 mg/dL (ref 0.44–1.00)
Glucose, Bld: 111 mg/dL — ABNORMAL HIGH (ref 65–99)
POTASSIUM: 4 mmol/L (ref 3.5–5.1)
SODIUM: 139 mmol/L (ref 135–145)

## 2015-12-04 LAB — PROTIME-INR
INR: 1 (ref 0.00–1.49)
Prothrombin Time: 13.4 seconds (ref 11.6–15.2)

## 2015-12-04 MED ORDER — MIDAZOLAM HCL 2 MG/2ML IJ SOLN
INTRAMUSCULAR | Status: AC
  Filled 2015-12-04: qty 4

## 2015-12-04 MED ORDER — CEFAZOLIN SODIUM-DEXTROSE 2-3 GM-% IV SOLR
2.0000 g | INTRAVENOUS | Status: AC
  Administered 2015-12-04: 2 g via INTRAVENOUS

## 2015-12-04 MED ORDER — MIDAZOLAM HCL 2 MG/2ML IJ SOLN
INTRAMUSCULAR | Status: AC | PRN
  Administered 2015-12-04: 0.5 mg via INTRAVENOUS
  Administered 2015-12-04: 1 mg via INTRAVENOUS
  Administered 2015-12-04 (×2): 0.5 mg via INTRAVENOUS

## 2015-12-04 MED ORDER — CEFAZOLIN SODIUM-DEXTROSE 2-3 GM-% IV SOLR
INTRAVENOUS | Status: AC
  Filled 2015-12-04: qty 50

## 2015-12-04 MED ORDER — LIDOCAINE HCL 1 % IJ SOLN
INTRAMUSCULAR | Status: AC
  Filled 2015-12-04: qty 20

## 2015-12-04 MED ORDER — FENTANYL CITRATE (PF) 100 MCG/2ML IJ SOLN
INTRAMUSCULAR | Status: AC | PRN
  Administered 2015-12-04: 25 ug via INTRAVENOUS
  Administered 2015-12-04: 50 ug via INTRAVENOUS
  Administered 2015-12-04: 25 ug via INTRAVENOUS

## 2015-12-04 MED ORDER — SODIUM CHLORIDE 0.9 % IV SOLN
INTRAVENOUS | Status: DC
  Administered 2015-12-04: 08:00:00 via INTRAVENOUS

## 2015-12-04 MED ORDER — FENTANYL CITRATE (PF) 100 MCG/2ML IJ SOLN
INTRAMUSCULAR | Status: AC
  Filled 2015-12-04: qty 2

## 2015-12-04 NOTE — H&P (Signed)
Referring Physician(s): Gudena,Vinay  Chief Complaint:  "I'm here to get my port out"  Subjective:  Pt familiar to IR service from prior port a cath placement on 09/23/14. She has history of left breast cancer, s/p lumpectomy with chemoradiation. She is currently on surveillance with tamoxifen only and presents today for port a cath removal. She denies fevers, chills, HA,CP, cough, abd/back pain,N/V or bleeding. She does have occ dyspnea with exertion.  Past Medical History  Diagnosis Date  . Recurrent cancer of left breast ([place]) 01/2015  . History of chemotherapy     finished chemo 01/17/2015  . Cough 02/02/2015    productive of yellow mucus   Past Surgical History  Procedure Laterality Date  . Breast reduction surgery Right 05/30/2010  . Ectopic pregnancy surgery Left 04/18/2006    partial exc. distal tube  . Cystectomy      ovary as well as scar tissue  . Cesarean section  2008    x 1  . Mastectomy Left 11/03/2009  . Vascular delay pre-tram Left 11/03/2009  . Reconstruction breast w/ tram flap Left 01/23/2010  . Incisional hernia repair  01/23/2010  . Revision reconstructed breast Left 05/30/2010  . Uterine fibroid surgery    . Portacath placement    . Breast lumpectomy with radioactive seed localization Left 02/09/2015    Procedure: LEFT BREAST LUMPECTOMY WITH RADIOACTIVE SEED LOCALIZATION;  Surgeon: Rolm Bookbinder, MD;  Location: [facility];  Service: General;  Laterality: Left;     Allergies: Corn-containing products  Medications: Prior to Admission medications   Medication Sig Start Date End Date Taking? Authorizing Provider  tamoxifen (NOLVADEX) 20 MG tablet Take 1 tablet (20 mg total) by mouth daily. 10/02/15  Yes Nicholas Lose, MD  diphenhydramine-acetaminophen (TYLENOL PM) 25-500 MG TABS Take 1 tablet by mouth at bedtime as needed.    Historical Provider, MD  Emollient (AQUAPHOR ADVANCED THERAPY) OINT Take 1 application by mouth daily. Hands and  feet 01/26/15   Historical Provider, MD  lidocaine-prilocaine (EMLA) cream Apply 1 application topically as needed. 05/30/15   Nicholas Lose, MD  LORazepam (ATIVAN) 0.5 MG tablet Take by mouth daily as needed.  01/16/15   Historical Provider, MD  tamoxifen (NOLVADEX) 20 MG tablet TAKE 1 TABLET (20 MG TOTAL) BY MOUTH DAILY. 10/30/15   Nicholas Lose, MD     Vital Signs: BP 125/78 mmHg  Pulse 95  Temp(Src) 97.7 F (36.5 C) (Oral)  Resp 18  Ht 5' 1.5" (1.562 m)  Wt 178 lb (80.74 kg)  BMI 33.09 kg/m2  SpO2 100%  Physical Exam  Constitutional: She is oriented to person, place, and time. She appears well-developed and well-nourished.  Cardiovascular: Normal rate and regular rhythm.   Clean, intact rt chest wall PAC  Pulmonary/Chest: Effort normal and breath sounds normal.  Abdominal: Soft. Bowel sounds are normal. There is no tenderness.  Musculoskeletal: Normal range of motion. She exhibits no edema.  Neurological: She is alert and oriented to person, place, and time.    Imaging: No results found.  Labs:  CBC:  Recent Labs  08/01/15 0839 08/21/15 0831 09/11/15 1250 12/04/15 0755  WBC 3.6* 3.4* 4.3 4.7  HGB 11.8 12.3 12.4 13.2  HCT 33.7* 35.7 36.1 37.6  PLT 215 227 264 261    COAGS:  Recent Labs  12/04/15 0755  INR 1.00    BMP:  Recent Labs  01/24/15 1120  08/01/15 0839 08/21/15 0831 09/11/15 1251 12/04/15 0755  NA 139  < >  142 141 142 139  K 4.2  < > 4.1 4.0 3.6 4.0  CL 106  --   --   --   --  108  CO2 28  < > 26 24 26 22   GLUCOSE 93  < > 98 97 88 111*  BUN 12  < > 10.8 11.9 9.1 11  CALCIUM 11.0*  < > 10.7* 10.0 9.9 9.3  CREATININE 0.71  < > 0.8 0.7 0.7 0.58  GFRNONAA >90  --   --   --   --  >60  GFRAA >90  --   --   --   --  >60  < > = values in this interval not displayed.  LIVER FUNCTION TESTS:  Recent Labs  07/10/15 0822 08/01/15 0839 08/21/15 0831 09/11/15 1251  BILITOT 0.44 0.22 <0.30 <0.30  AST 18 15 17 16   ALT 13 15 11 11   ALKPHOS 117  107 122 125  PROT 6.9 6.5 6.5 7.1  ALBUMIN 3.5 3.2* 3.2* 3.5    Assessment and Plan: Pt with history of left breast cancer, s/p lumpectomy with chemoradiation. She is currently on surveillance with tamoxifen only and presents today for port a cath removal.Details/risks of procedure , including but not limited to, internal bleeding, infection discussed with pt with her understanding and consent.    Signed: D. Rowe Robert 12/04/2015, 8:41 AM   I spent a total of 15 minutes at the the patient's bedside AND on the patient's hospital floor or unit, greater than 50% of which was counseling/coordinating care for port a cath removal

## 2015-12-04 NOTE — Procedures (Signed)
RIJV PAC removal No comp/EBL 

## 2015-12-04 NOTE — Discharge Instructions (Signed)
Incision Care °An incision is when a surgeon cuts into your body. After surgery, the incision needs to be cared for properly to prevent infection.  °HOW TO CARE FOR YOUR INCISION °· Take medicines only as directed by your health care provider. °· There are many different ways to close and cover an incision, including stitches, skin glue, and adhesive strips. Follow your health care provider's instructions on: °¨ Incision care. °¨ Bandage (dressing) changes and removal. °¨ Incision closure removal. °· Do not take baths, swim, or use a hot tub until your health care provider approves. You may shower as directed by your health care provider. °· Resume your normal diet and activities as directed. °· Use anti-itch medicine (such as an antihistamine) as directed by your health care provider. The incision may itch while it is healing. Do not pick or scratch at the incision. °· Drink enough fluid to keep your urine clear or pale yellow. °SEEK MEDICAL CARE IF:  °· You have drainage, redness, swelling, or pain at your incision site. °· You have muscle aches, chills, or a general ill feeling. °· You notice a bad smell coming from the incision or dressing. °· Your incision edges separate after the sutures, staples, or skin adhesive strips have been removed. °· You have persistent nausea or vomiting. °· You have a fever. °· You are dizzy. °SEEK IMMEDIATE MEDICAL CARE IF:  °· You have a rash. °· You faint. °· You have difficulty breathing. °MAKE SURE YOU:  °· Understand these instructions. °· Will watch your condition. °· Will get help right away if you are not doing well or get worse. °  °This information is not intended to replace advice given to you by your health care provider. Make sure you discuss any questions you have with your health care provider. °  °Document Released: 05/31/2005 Document Revised: 12/02/2014 Document Reviewed: 01/05/2014 °Elsevier Interactive Patient Education ©2016 [employer] ° ° °Moderate  Conscious Sedation, Adult, Care After °Refer to this sheet in the next few weeks. These instructions provide you with information on caring for yourself after your procedure. Your health care provider may also give you more specific instructions. Your treatment has been planned according to current medical practices, but problems sometimes occur. Call your health care provider if you have any problems or questions after your procedure. °WHAT TO EXPECT AFTER THE PROCEDURE  °After your procedure: °· You may feel sleepy, clumsy, and have poor balance for several hours. °· Vomiting may occur if you eat too soon after the procedure. °HOME CARE INSTRUCTIONS °· Do not participate in any activities where you could become injured for at least 24 hours. Do not: °¨ Drive. °¨ Swim. °¨ Ride a bicycle. °¨ Operate heavy machinery. °¨ Cook. °¨ Use power tools. °¨ Climb ladders. °¨ Work from a high place. °· Do not make important decisions or sign legal documents until you are improved. °· If you vomit, drink water, juice, or soup when you can drink without vomiting. Make sure you have little or no nausea before eating solid foods. °· Only take over-the-counter or prescription medicines for pain, discomfort, or fever as directed by your health care provider. °· Make sure you and your family fully understand everything about the medicines given to you, including what side effects may occur. °· You should not drink alcohol, take sleeping pills, or take medicines that cause drowsiness for at least 24 hours. °· If you smoke, do not smoke without supervision. °· If you are feeling   better, you may resume normal activities 24 hours after you were sedated. °· Keep all appointments with your health care provider. °SEEK MEDICAL CARE IF: °· Your skin is pale or bluish in color. °· You continue to feel nauseous or vomit. °· Your pain is getting worse and is not helped by medicine. °· You have bleeding or swelling. °· You are still sleepy or  feeling clumsy after 24 hours. °SEEK IMMEDIATE MEDICAL CARE IF: °· You develop a rash. °· You have difficulty breathing. °· You develop any type of allergic problem. °· You have a fever. °MAKE SURE YOU: °· Understand these instructions. °· Will watch your condition. °· Will get help right away if you are not doing well or get worse. °  °This information is not intended to replace advice given to you by your health care provider. Make sure you discuss any questions you have with your health care provider. °  °Document Released: 09/01/2013 Document Revised: 12/02/2014 Document Reviewed: 09/01/2013 °Elsevier Interactive Patient Education ©2016 [employer] ° °

## 2016-02-17 ENCOUNTER — Emergency Department (HOSPITAL_COMMUNITY): Payer: BLUE CROSS/BLUE SHIELD

## 2016-02-17 ENCOUNTER — Emergency Department (HOSPITAL_COMMUNITY)
Admission: EM | Admit: 2016-02-17 | Discharge: 2016-02-18 | Disposition: A | Discharge status: Home / Self Care | Payer: BLUE CROSS/BLUE SHIELD | Attending: Emergency Medicine | Admitting: Emergency Medicine

## 2016-02-17 ENCOUNTER — Encounter (HOSPITAL_COMMUNITY): Payer: Self-pay | Admitting: Nurse Practitioner

## 2016-02-17 DIAGNOSIS — R1032 Left lower quadrant pain: Secondary | ICD-10-CM | POA: Diagnosis present

## 2016-02-17 DIAGNOSIS — Z9889 Other specified postprocedural states: Secondary | ICD-10-CM | POA: Insufficient documentation

## 2016-02-17 DIAGNOSIS — R109 Unspecified abdominal pain: Secondary | ICD-10-CM

## 2016-02-17 DIAGNOSIS — Z853 Personal history of malignant neoplasm of breast: Secondary | ICD-10-CM | POA: Diagnosis not present

## 2016-02-17 DIAGNOSIS — Z3202 Encounter for pregnancy test, result negative: Secondary | ICD-10-CM | POA: Insufficient documentation

## 2016-02-17 DIAGNOSIS — Z79899 Other long term (current) drug therapy: Secondary | ICD-10-CM | POA: Insufficient documentation

## 2016-02-17 DIAGNOSIS — Z9221 Personal history of antineoplastic chemotherapy: Secondary | ICD-10-CM | POA: Diagnosis not present

## 2016-02-17 DIAGNOSIS — N83202 Unspecified ovarian cyst, left side: Secondary | ICD-10-CM

## 2016-02-17 DIAGNOSIS — R350 Frequency of micturition: Secondary | ICD-10-CM | POA: Diagnosis not present

## 2016-02-17 LAB — BASIC METABOLIC PANEL
ANION GAP: 8 (ref 5–15)
BUN: 9 mg/dL (ref 6–20)
CO2: 22 mmol/L (ref 22–32)
Calcium: 10.1 mg/dL (ref 8.9–10.3)
Chloride: 111 mmol/L (ref 101–111)
Creatinine, Ser: 0.64 mg/dL (ref 0.44–1.00)
GFR calc Af Amer: >60 mL/min (ref >60)
GLUCOSE: 94 mg/dL (ref 65–99)
POTASSIUM: 3.9 mmol/L (ref 3.5–5.1)
Sodium: 141 mmol/L (ref 135–145)

## 2016-02-17 LAB — URINALYSIS, ROUTINE W REFLEX MICROSCOPIC
Bilirubin Urine: NEGATIVE
Glucose, UA: NEGATIVE mg/dL
Hgb urine dipstick: NEGATIVE
Ketones, ur: NEGATIVE mg/dL
LEUKOCYTES UA: NEGATIVE
NITRITE: NEGATIVE
Protein, ur: NEGATIVE mg/dL
SPECIFIC GRAVITY, URINE: 1.027 (ref 1.005–1.030)
pH: 6 (ref 5.0–8.0)

## 2016-02-17 LAB — CBC WITH DIFFERENTIAL/PLATELET
BASOS ABS: 0 10*3/uL (ref 0.0–0.1)
Basophils Relative: 0 %
EOS PCT: 1 %
Eosinophils Absolute: 0.1 10*3/uL (ref 0.0–0.7)
HEMATOCRIT: 35.3 % — AB (ref 36.0–46.0)
Hemoglobin: 12.4 g/dL (ref 12.0–15.0)
LYMPHS PCT: 36 %
Lymphs Abs: 2 10*3/uL (ref 0.7–4.0)
MCH: 30.2 pg (ref 26.0–34.0)
MCHC: 35.1 g/dL (ref 30.0–36.0)
MCV: 86.1 fL (ref 78.0–100.0)
Monocytes Absolute: 0.4 10*3/uL (ref 0.1–1.0)
Monocytes Relative: 7 %
NEUTROS ABS: 3.1 10*3/uL (ref 1.7–7.7)
Neutrophils Relative %: 56 %
PLATELETS: 250 10*3/uL (ref 150–400)
RBC: 4.1 MIL/uL (ref 3.87–5.11)
RDW: 12.1 % (ref 11.5–15.5)
WBC: 5.6 10*3/uL (ref 4.0–10.5)

## 2016-02-17 LAB — PREGNANCY, URINE: PREG TEST UR: NEGATIVE

## 2016-02-17 NOTE — ED Notes (Signed)
Pt states she was referred from Urgent care for further evaluation with [facility] for urinary symptoms, reportedly has been having intermittent left flank pain and urianry frequency. States pain radiates to the CVA aspect of her back but denies dysuria but reports "pressure when voiding."

## 2016-02-17 NOTE — ED Provider Notes (Signed)
CSN: [id number]     Arrival date & time 02/17/16  1553 History   First MD Initiated Contact with Patient 02/17/16 1702     Chief Complaint  Patient presents with  . Flank Pain     (Consider location/radiation/quality/duration/timing/severity/associated sxs/prior Treatment) Patient is a 45 y.o. female presenting with flank pain. The history is provided by the patient and medical records.  Flank Pain   45 year old female with history of breast cancer status post lumpectomy and chemotherapy completed in Feb 2016, uterine fibroids, presenting to the ED for left sided abdominal pain. She reports for approximately 1 week now she has been having intermittent left lower abdominal pain which extends into her groin. She reports urinary frequency but denies dysuria. She does have a "pressure" sensation during and after urination.  She denies any fever or chills. No nausea, vomiting, or diarrhea.  No hx of kidney stones. Patient does report that her menstrual cycle resumed in January 2017 after several months due to her chemotherapy. She reports her cycle should have started on 02/14/2016, however it is yet to began.  Past Medical History  Diagnosis Date  . Recurrent cancer of left breast ([place]) 01/2015  . History of chemotherapy     finished chemo 01/17/2015  . Cough 02/02/2015    productive of yellow mucus   Past Surgical History  Procedure Laterality Date  . Breast reduction surgery Right 05/30/2010  . Ectopic pregnancy surgery Left 04/18/2006    partial exc. distal tube  . Cystectomy      ovary as well as scar tissue  . Cesarean section  2008    x 1  . Mastectomy Left 11/03/2009  . Vascular delay pre-tram Left 11/03/2009  . Reconstruction breast w/ tram flap Left 01/23/2010  . Incisional hernia repair  01/23/2010  . Revision reconstructed breast Left 05/30/2010  . Uterine fibroid surgery    . Portacath placement    . Breast lumpectomy with radioactive seed localization Left 02/09/2015    Procedure:  LEFT BREAST LUMPECTOMY WITH RADIOACTIVE SEED LOCALIZATION;  Surgeon: Rolm Bookbinder, MD;  Location: [facility];  Service: General;  Laterality: Left;   Family History  Problem Relation Age of Onset  . Breast cancer Maternal Aunt 58    currently 63  . Colon cancer Maternal Uncle    Social History  Substance Use Topics  . Smoking status: Never Smoker   . Smokeless tobacco: Never Used  . Alcohol Use: No   OB History    No data available     Review of Systems  Genitourinary: Positive for frequency and flank pain.  All other systems reviewed and are negative.     Allergies  Corn-containing products  Home Medications   Prior to Admission medications   Medication Sig Start Date End Date Taking? Authorizing Provider  diphenhydramine-acetaminophen (TYLENOL PM) 25-500 MG TABS Take 1 tablet by mouth at bedtime as needed.    Historical Provider, MD  Emollient (AQUAPHOR ADVANCED THERAPY) OINT Take 1 application by mouth daily. Hands and feet 01/26/15   Historical Provider, MD  lidocaine-prilocaine (EMLA) cream Apply 1 application topically as needed. 05/30/15   Nicholas Lose, MD  LORazepam (ATIVAN) 0.5 MG tablet Take by mouth daily as needed.  01/16/15   Historical Provider, MD  tamoxifen (NOLVADEX) 20 MG tablet Take 1 tablet (20 mg total) by mouth daily. 10/02/15   Nicholas Lose, MD  tamoxifen (NOLVADEX) 20 MG tablet TAKE 1 TABLET (20 MG TOTAL) BY MOUTH DAILY. 10/30/15  Nicholas Lose, MD   BP 129/83 mmHg  Pulse 99  Temp(Src) 98.3 F (36.8 C) (Oral)  Resp 14  SpO2 99%   Physical Exam  Constitutional: She is oriented to person, place, and time. She appears well-developed and well-nourished. No distress.  HENT:  Head: Normocephalic and atraumatic.  Mouth/Throat: Oropharynx is clear and moist.  Eyes: Conjunctivae and EOM are normal. Pupils are equal, round, and reactive to light.  Neck: Normal range of motion. Neck supple.  Cardiovascular: Normal rate, regular rhythm and  normal heart sounds.   Pulmonary/Chest: Effort normal and breath sounds normal. No respiratory distress. She has no wheezes.  Abdominal: Soft. Bowel sounds are normal. There is no tenderness. There is no guarding.  Abdomen soft, non-tender  Musculoskeletal: Normal range of motion. She exhibits no edema.  Neurological: She is alert and oriented to person, place, and time.  Skin: Skin is warm and dry. She is not diaphoretic.  Psychiatric: She has a normal mood and affect.  Nursing note and vitals reviewed.   ED Course  Procedures (including critical care time) Labs Review Labs Reviewed  URINALYSIS, ROUTINE W REFLEX MICROSCOPIC (NOT AT [facility]) - Abnormal; Notable for the following:    APPearance CLOUDY (*)    All other components within normal limits  CBC WITH DIFFERENTIAL/PLATELET - Abnormal; Notable for the following:    HCT 35.3 (*)    All other components within normal limits  PREGNANCY, URINE  BASIC METABOLIC PANEL    Imaging Review US Transvaginal Non-ob  02/18/2016  CLINICAL DATA:  45 year old female with history of breast cancer presenting with left abdominal pain. EXAM: TRANSABDOMINAL AND TRANSVAGINAL ULTRASOUND OF PELVIS DOPPLER ULTRASOUND OF OVARIES TECHNIQUE: Both transabdominal and transvaginal ultrasound examinations of the pelvis were performed. Transabdominal technique was performed for global imaging of the pelvis including uterus, ovaries, adnexal regions, and pelvic cul-de-sac. It was necessary to proceed with endovaginal exam following the transabdominal exam to visualize the endometrium and the ovaries. Color and duplex Doppler ultrasound was utilized to evaluate blood flow to the ovaries. COMPARISON:  CT dated 02/17/2016 and PET-CT dated 09/29/2014 FINDINGS: Uterus Measurements: 10.2 x 4.5 x 6.2 cm. The uterus is anteverted and slightly heterogeneous. No fibroids or other mass visualized. Endometrium Thickness: 8 mm.  No focal abnormality visualized. Right ovary Not  visualized Left ovary The left ovary measures 6.2 x 5.0 x 5.6 cm. There is a complex predominantly cystic mass with lace-like content in the left adnexa. Doppler images do not demonstrate flow within this mass. Low resistance arterial and venous flow noted in the periphery of the mass likely within the ovarian tissue. This mass most likely represents a hemorrhagic cyst. A hypermetabolic lesion noted on the prior PET-CT in the left ovary which may correspond to this mass. With history of breast cancer further evaluation with MRI is warranted. Other findings Trace IMPRESSION: Left ovarian complex predominantly cystic mass most likely a hemorrhagic cyst. Further evaluation with nonemergent MRI without and with contrast is recommended. Electronically Signed   By: Anner Crete M.D.   On: 02/18/2016 00:06   US Pelvis Complete  02/18/2016  CLINICAL DATA:  45 year old female with history of breast cancer presenting with left abdominal pain. EXAM: TRANSABDOMINAL AND TRANSVAGINAL ULTRASOUND OF PELVIS DOPPLER ULTRASOUND OF OVARIES TECHNIQUE: Both transabdominal and transvaginal ultrasound examinations of the pelvis were performed. Transabdominal technique was performed for global imaging of the pelvis including uterus, ovaries, adnexal regions, and pelvic cul-de-sac. It was necessary to proceed with endovaginal exam following  the transabdominal exam to visualize the endometrium and the ovaries. Color and duplex Doppler ultrasound was utilized to evaluate blood flow to the ovaries. COMPARISON:  CT dated 02/17/2016 and PET-CT dated 09/29/2014 FINDINGS: Uterus Measurements: 10.2 x 4.5 x 6.2 cm. The uterus is anteverted and slightly heterogeneous. No fibroids or other mass visualized. Endometrium Thickness: 8 mm.  No focal abnormality visualized. Right ovary Not visualized Left ovary The left ovary measures 6.2 x 5.0 x 5.6 cm. There is a complex predominantly cystic mass with lace-like content in the left adnexa. Doppler  images do not demonstrate flow within this mass. Low resistance arterial and venous flow noted in the periphery of the mass likely within the ovarian tissue. This mass most likely represents a hemorrhagic cyst. A hypermetabolic lesion noted on the prior PET-CT in the left ovary which may correspond to this mass. With history of breast cancer further evaluation with MRI is warranted. Other findings Trace IMPRESSION: Left ovarian complex predominantly cystic mass most likely a hemorrhagic cyst. Further evaluation with nonemergent MRI without and with contrast is recommended. Electronically Signed   By: Anner Crete M.D.   On: 02/18/2016 00:06   [place] Art/ven Flow Abd Pelv Doppler  02/18/2016  CLINICAL DATA:  45 year old female with history of breast cancer presenting with left abdominal pain. EXAM: TRANSABDOMINAL AND TRANSVAGINAL ULTRASOUND OF PELVIS DOPPLER ULTRASOUND OF OVARIES TECHNIQUE: Both transabdominal and transvaginal ultrasound examinations of the pelvis were performed. Transabdominal technique was performed for global imaging of the pelvis including uterus, ovaries, adnexal regions, and pelvic cul-de-sac. It was necessary to proceed with endovaginal exam following the transabdominal exam to visualize the endometrium and the ovaries. Color and duplex Doppler ultrasound was utilized to evaluate blood flow to the ovaries. COMPARISON:  CT dated 02/17/2016 and PET-CT dated 09/29/2014 FINDINGS: Uterus Measurements: 10.2 x 4.5 x 6.2 cm. The uterus is anteverted and slightly heterogeneous. No fibroids or other mass visualized. Endometrium Thickness: 8 mm.  No focal abnormality visualized. Right ovary Not visualized Left ovary The left ovary measures 6.2 x 5.0 x 5.6 cm. There is a complex predominantly cystic mass with lace-like content in the left adnexa. Doppler images do not demonstrate flow within this mass. Low resistance arterial and venous flow noted in the periphery of the mass likely within the ovarian  tissue. This mass most likely represents a hemorrhagic cyst. A hypermetabolic lesion noted on the prior PET-CT in the left ovary which may correspond to this mass. With history of breast cancer further evaluation with MRI is warranted. Other findings Trace IMPRESSION: Left ovarian complex predominantly cystic mass most likely a hemorrhagic cyst. Further evaluation with nonemergent MRI without and with contrast is recommended. Electronically Signed   By: Anner Crete M.D.   On: 02/18/2016 00:06   Ct Renal Stone Study  02/17/2016  CLINICAL DATA:  Left flank pain, urinary frequency EXAM: CT ABDOMEN AND PELVIS WITHOUT CONTRAST TECHNIQUE: Multidetector CT imaging of the abdomen and pelvis was performed following the standard protocol without IV contrast. COMPARISON:  PET-CT dated 09/29/2014 FINDINGS: Lower chest:  Lung bases are clear. Hepatobiliary: Unenhanced liver is unremarkable. Gallbladder is unremarkable. No intrahepatic or extrahepatic ductal dilatation. Pancreas: Within normal limits. Spleen: Within normal limits. Adrenals/Urinary Tract: Adrenal glands are within normal limits. No renal, ureteral, or bladder calculi.  No hydronephrosis. Bladder is within normal limits. Stomach/Bowel: Stomach is within normal limits. No evidence of bowel obstruction. Normal appendix (series 2/image 51). Vascular/Lymphatic: No evidence of abdominal aortic aneurysm. No suspicious abdominopelvic lymphadenopathy.  Reproductive: Uterus is mildly heterogeneous. Right ovary is notable for a 4.9 x 5.6 cm complex/hyperdense lesion medially, possibly reflecting a hemorrhagic cyst. However, a smaller lesion with hypermetabolism was present in this location on prior PET-CT. Additional 4.1 x 3.1 cm simple cyst anteriorly (series 2/image 58). No evidence of right adnexal mass. Other: No abdominopelvic ascites. Musculoskeletal: Postsurgical changes in the left lower chest wall. 5.0 x 2.2 cm fluid collection/seroma inferiorly along the  left lateral chest wall (series 2/image 1). Mild degenerative changes of the lumbar spine. IMPRESSION: No renal, ureteral, or bladder calculi.  No hydronephrosis. 4.9 x 5.6 mm hyperdense lesion along the medial aspect of the left ovary, possibly reflecting a hemorrhagic cyst. However, this may correspond to a focus of hypermetabolism on prior PET, and is incompletely characterized on CT. OB-GYN consultation is suggested. Given the prior PET, consider follow-up MR pelvis with/without contrast for definitive characterization. 5.0 x 2.2 cm fluid collection/seroma inferiorly along the left lateral chest wall, incompletely visualized. Electronically Signed   By: Julian Hy M.D.   On: 02/17/2016 19:49   I have personally reviewed and evaluated these images and lab results as part of my medical decision-making.   EKG Interpretation None      MDM   Final diagnoses:  Left sided abdominal pain  Left sided abdominal pain   44 rolled female here with left lower abdominal pain with urinary frequency and pelvic "pressure" when urinating.  This is been ongoing for the past week. Patient is afebrile, nontoxic. Her abdomen is soft and nontender. She has no CVA tenderness. No history of kidney stones. Lab work is reassuring. UA without signs of infection or significant hematuria.  No complaint of vaginal discharge or concern for STD at this time.  CT renal study was obtained without evidence of ureteral calculi, however there is incidental finding of a hyperdense lesion in the left ovary, questionable hemorrhagic cyst. Patient does have history of breast cancer and radiologist noted on her prior PET scan she did have a hyperdense lesion in this area.  Due to these findings, pelvic ultrasound was obtained revealing a likely hemorrhagic cysts. Given patient's history of breast cancer, it is recommended that she have a follow-up MRI to ensure no malignancy.  Results were discussed with patient and husband at  bedside, vague knowledge understanding. She will schedule follow-up on Monday.  Rx percocet for pain.  Discussed plan with patient, he/she acknowledged understanding and agreed with plan of care.  Return precautions given for new or worsening symptoms.  Larene Pickett, PA-C 02/18/16 0032  Lacretia Leigh, MD 02/22/16 [id number]

## 2016-02-18 MED ORDER — OXYCODONE-ACETAMINOPHEN 5-325 MG PO TABS: 1.0000 | ORAL_TABLET | ORAL | Status: DC | PRN

## 2016-02-18 NOTE — Discharge Instructions (Signed)
Take the prescribed medication as directed. Follow-up with your OB-GYN.  It is recommended that you have a follow-up MRI to ensure no other acute findings given your history. Return to the ED for new or worsening symptoms.

## 2016-03-11 ENCOUNTER — Ambulatory Visit (HOSPITAL_BASED_OUTPATIENT_CLINIC_OR_DEPARTMENT_OTHER): Payer: BLUE CROSS/BLUE SHIELD | Admitting: Hematology and Oncology

## 2016-03-11 ENCOUNTER — Telehealth: Payer: Self-pay | Admitting: Hematology and Oncology

## 2016-03-11 ENCOUNTER — Encounter: Payer: Self-pay | Admitting: Hematology and Oncology

## 2016-03-11 VITALS — BP 130/80 | HR 98 | Temp 98.1°F | Resp 18 | Ht 61.5 in | Wt 192.2 lb

## 2016-03-11 DIAGNOSIS — C50412 Malignant neoplasm of upper-outer quadrant of left female breast: Secondary | ICD-10-CM | POA: Diagnosis not present

## 2016-03-11 DIAGNOSIS — Z923 Personal history of irradiation: Secondary | ICD-10-CM

## 2016-03-11 DIAGNOSIS — Z79810 Long term (current) use of selective estrogen receptor modulators (SERMs): Secondary | ICD-10-CM | POA: Diagnosis not present

## 2016-03-11 DIAGNOSIS — Z9221 Personal history of antineoplastic chemotherapy: Secondary | ICD-10-CM | POA: Diagnosis not present

## 2016-03-11 NOTE — Telephone Encounter (Signed)
appt made and avs printed °

## 2016-03-11 NOTE — Assessment & Plan Note (Signed)
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 clinical stage ER 7% PR 0% HER-2 positive ratio 3.84 in the reconstructed left breast. Completed 6 cycles of of TCHP, currently on Herceptin maintenance that will complete November 2016  Post neoadjuvant MRI: Decrease in size of the mass from 4 cm to 2.8 cm; Final pathology counseling: pathologic complete response based on lumpectomy March 2016  Completed radiation thearpy in June 2016 Started tamoxifen June 2016  Current treatment:  1. Herceptin maintenance completed Nov 7th 2016 (ECHO 9/16: EF 55-60%) 2. Tamoxifen daily started June 2016  Tamoxifen Toxicities: tolerating the tamoxifen relatively well so far.  1.Hot flashes that wake her up at night, but otherwise has no issues.   Breast cancer surveillance:  Mammogram appointment Oct 2016: No suspicious masses Hemorrhagic ovarian cyst: Patient being evaluated by GYN  RTC 6 months for follow-up.

## 2016-03-11 NOTE — Progress Notes (Signed)
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 The genes tested were ATM, BARD1, BRCA1, BRCA2, BRIP1, CDH1, CHEK2, MRE11A, MUTYH, NBN, NF1, PALB2, PTEN, RAD50, RAD51C, RAD51D, and TP53.   01/30/2015 Breast MRI Left breast: Masses decrease in size from 4 cm to 2.8 cm abuts the pectoralis muscle   02/07/2015 -  Chemotherapy Herceptin maintenance therapy to complete November 2016   02/09/2015 Surgery Left breast lumpectomy: Pathologic complete response   03/13/2015 - 05/02/2015 Radiation Therapy Adjuvant radiation therapy   05/30/2015 -  Anti-estrogen oral therapy Tamoxifen 20 mg daily    CHIEF COMPLIANT: follow-up on tamoxifen therapy  INTERVAL HISTORY: Aimee Poole is a 45 year old with above-mentioned history of left breast invasive ductal carcinoma treated with neoadjuvant chemotherapy followed by lumpectomy. She completed Herceptin maintenance. She also finished adjuvant  radiation and is currently on tamoxifen therapy. She complains of ankle stiffness in the morning. She is also concerned with weight gain.  REVIEW OF SYSTEMS:   Constitutional: Denies fevers, chills or abnormal weight loss Eyes: Denies blurriness of vision Ears, nose, mouth, throat, and face: Denies mucositis or sore throat Respiratory: Denies cough, dyspnea or wheezes Cardiovascular: Denies palpitation, chest discomfort Gastrointestinal:  Denies nausea, heartburn or change in bowel habits Skin: Denies abnormal skin rashes Lymphatics: Denies new lymphadenopathy or easy bruising Neurological:Denies numbness, tingling or new weaknesses Behavioral/Psych: Mood is stable, no new changes  Extremities: No lower extremity edema Breast: occasional discomfort in the left breast All other systems were reviewed with the patient and are negative.  I have reviewed the past medical history, past surgical history, social history and family history with the patient and they are unchanged from previous note.  ALLERGIES:  is allergic to corn-containing products.  MEDICATIONS:  Current Outpatient Prescriptions  Medication Sig Dispense Refill  . diphenhydramine-acetaminophen (TYLENOL PM) 25-500 MG TABS Take 1 tablet by mouth at bedtime as needed (sleep).     . Emollient (AQUAPHOR ADVANCED THERAPY) OINT Take 1 application by mouth daily as needed (dry hands and feet.). Hands and feet  0  . lidocaine-prilocaine (EMLA) cream Apply 1 application topically as needed. (Patient not taking: Reported on 02/17/2016) 30 g 3  . LORazepam (ATIVAN) 0.5 MG tablet Take 0.5 mg by mouth daily as needed (nausea).   0  . oxyCODONE-acetaminophen (PERCOCET/ROXICET) 5-325 MG tablet Take 1 tablet by mouth every 4 (four) hours as needed. 15 tablet 0  . tamoxifen (NOLVADEX)  20 MG tablet Take 1 tablet (20 mg total) by mouth daily. (Patient not taking: Reported on 02/17/2016) 90 tablet 3  . tamoxifen (NOLVADEX) 20 MG tablet TAKE 1 TABLET  (20 MG TOTAL) BY MOUTH DAILY. (Patient taking differently: TAKE 1 TABLET (20 MG TOTAL) BY MOUTH EVERY NIGHT.) 90 tablet 2   No current facility-administered medications for this visit.    PHYSICAL EXAMINATION: ECOG PERFORMANCE STATUS: 1 - Symptomatic but completely ambulatory  Filed Vitals:   03/11/16 0905  BP: 130/80  Pulse: 98  Temp: 98.1 F (36.7 C)  Resp: 18   Filed Weights   03/11/16 0905  Weight: 192 lb 3.2 oz (87.181 kg)    GENERAL:alert, no distress and comfortable SKIN: skin color, texture, turgor are normal, no rashes or significant lesions EYES: normal, Conjunctiva are pink and non-injected, sclera clear OROPHARYNX:no exudate, no erythema and lips, buccal mucosa, and tongue normal  NECK: supple, thyroid normal size, non-tender, without nodularity LYMPH:  no palpable lymphadenopathy in the cervical, axillary or inguinal LUNGS: clear to auscultation and percussion with normal breathing effort HEART: regular rate & rhythm and no murmurs and no lower extremity edema ABDOMEN:abdomen soft, non-tender and normal bowel sounds MUSCULOSKELETAL:no cyanosis of digits and no clubbing  NEURO: alert & oriented x 3 with fluent speech, no focal motor/sensory deficits EXTREMITIES: No lower extremity edema BREAST: No palpable masses or nodules in either right or left breasts. No palpable axillary supraclavicular or infraclavicular adenopathy no breast tenderness or nipple discharge. (exam performed in the presence of a chaperone)  LABORATORY DATA:  I have reviewed the data as listed   Chemistry      Component Value Date/Time   NA 141 02/17/2016 1747   NA 142 09/11/2015 1251   K 3.9 02/17/2016 1747   K 3.6 09/11/2015 1251   CL 111 02/17/2016 1747   CO2 22 02/17/2016 1747   CO2 26 09/11/2015 1251   BUN 9 02/17/2016 1747   BUN 9.1 09/11/2015 1251   CREATININE 0.64 02/17/2016 1747   CREATININE 0.7 09/11/2015 1251      Component Value Date/Time   CALCIUM 10.1 02/17/2016 1747     CALCIUM 9.9 09/11/2015 1251   ALKPHOS 125 09/11/2015 1251   ALKPHOS 91 11/01/2009 1212   AST 16 09/11/2015 1251   AST 22 11/01/2009 1212   ALT 11 09/11/2015 1251   ALT 22 11/01/2009 1212   BILITOT <0.30 09/11/2015 1251   BILITOT 0.6 11/01/2009 1212       Lab Results  Component Value Date   WBC 5.6 02/17/2016   HGB 12.4 02/17/2016   HCT 35.3* 02/17/2016   MCV 86.1 02/17/2016   PLT 250 02/17/2016   NEUTROABS 3.1 02/17/2016   ASSESSMENT & PLAN:  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 clinical stage ER 7% PR 0% HER-2 positive ratio 3.84 in the reconstructed left breast. Completed 6 cycles of of TCHP, currently on Herceptin maintenance that will complete November 2016  Post neoadjuvant MRI: Decrease in size of the mass from 4 cm to 2.8 cm; Final pathology counseling: pathologic complete response based on lumpectomy March 2016  Completed radiation thearpy in June 2016 Started tamoxifen June 2016  Current treatment:  1. Herceptin maintenance completed Nov 7th 2016 (ECHO 9/16: EF 55-60%) 2. Tamoxifen daily started June 2016  Tamoxifen Toxicities: tolerating the tamoxifen relatively well so far.  1.Hot flashes that wake her up at  night, but otherwise has no issues.   Breast cancer surveillance:  Mammogram appointment Oct 2016: No suspicious masses Breast exam: 03/11/2016  no palpable lumps or nodules. Hemorrhagic ovarian cyst: Patient being evaluated by GYN  RTC 6 months for follow-up.  No orders of the defined types were placed in this encounter.   The patient has a good understanding of the overall plan. she agrees with it. she will call with any problems that may develop before the next visit here.   Rulon Eisenmenger, MD 03/11/2016

## 2016-05-07 IMAGING — CT CT ANGIO CHEST
1 of 2 series · 19 of 32 positions shown · IV contrast (OMNIPAQUE 350)
Comparison: PET-CT 09/29/2014.

CLINICAL DATA: Breast cancer. Short of breath. Syncope. Pulmonary
embolism. Mastectomy. Chemotherapy 01/17/2015.

EXAM:
CT ANGIOGRAPHY CHEST WITH CONTRAST
TECHNIQUE: Multidetector CT imaging of the chest was performed using the
standard protocol during bolus administration of intravenous
contrast. Multiplanar CT image reconstructions and MIPs were
obtained to evaluate the vascular anatomy.
CONTRAST:  100mL OMNIPAQUE IOHEXOL 350 MG/ML SOLN

[Series 6: thins for pacs · axial · 0.74mm/px · z∈[+1520,+1704]mm · 19 of 206 slices shown]
[im 11/206  lung]
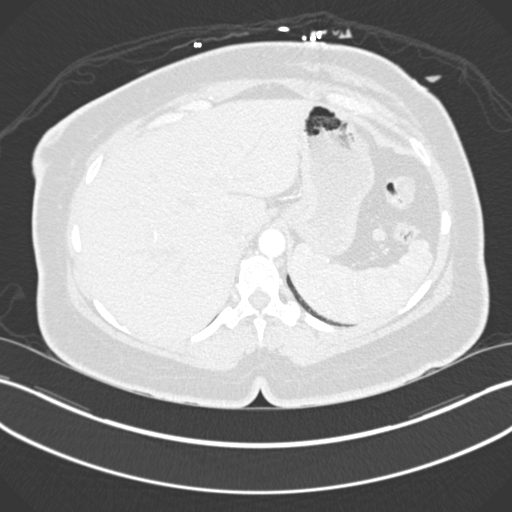
[im 21/206  mediastinal]
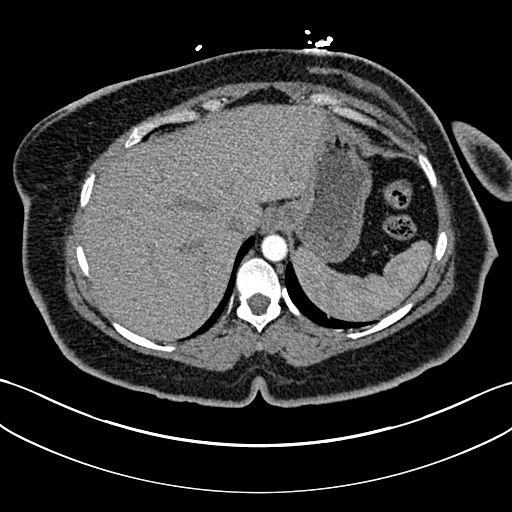
[im 31/206  lung]
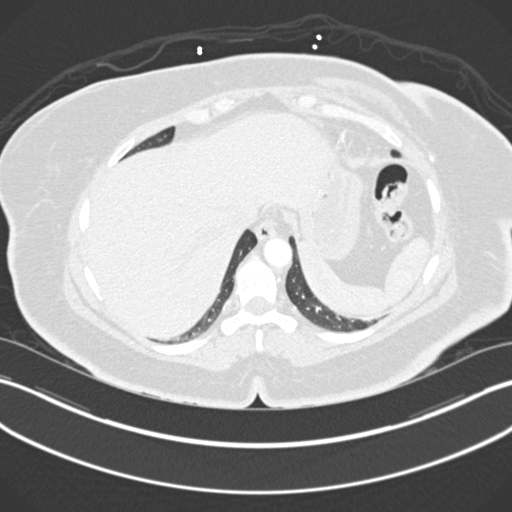
[im 52/206  mediastinal]
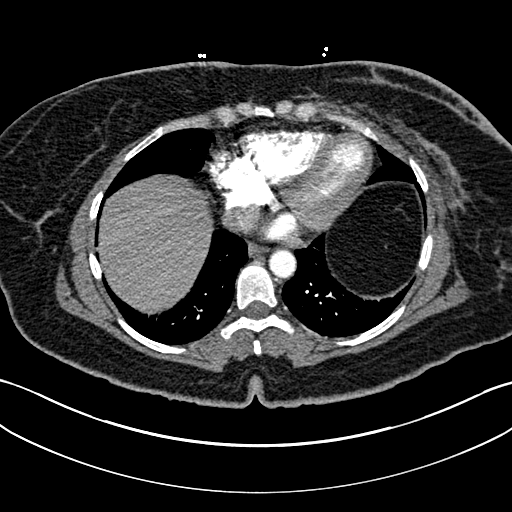
[im 62/206  lung]
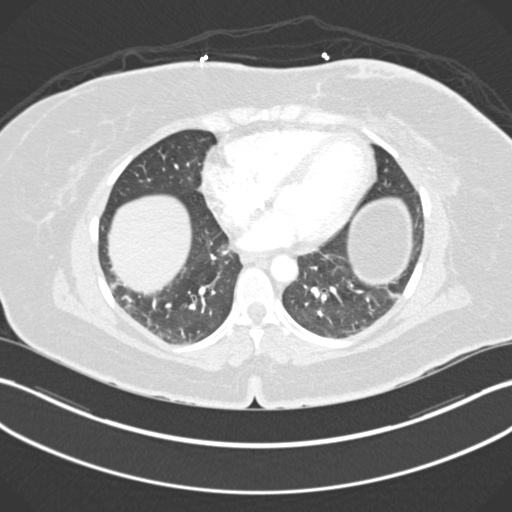
[im 69/206  mediastinal]
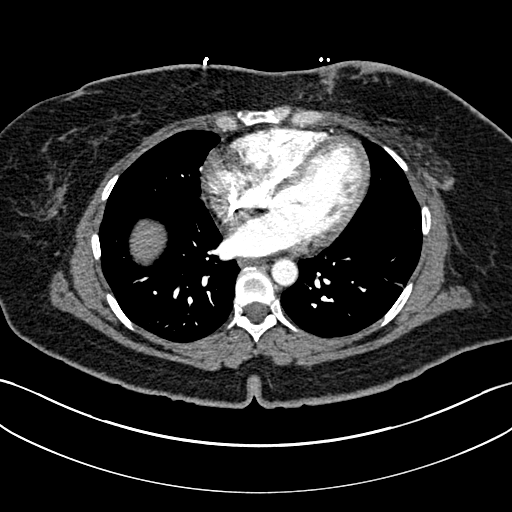
[im 72/206  lung]
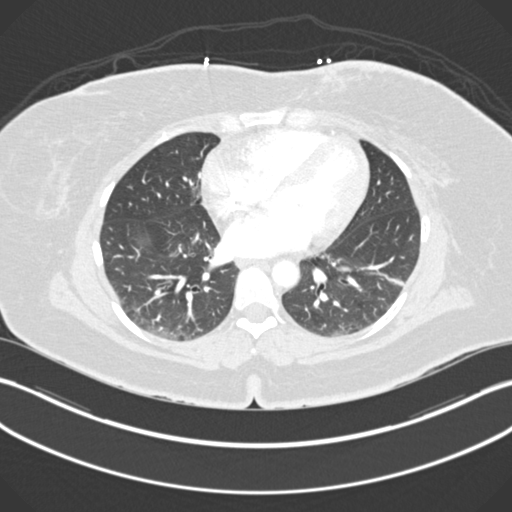
[im 83/206  mediastinal]
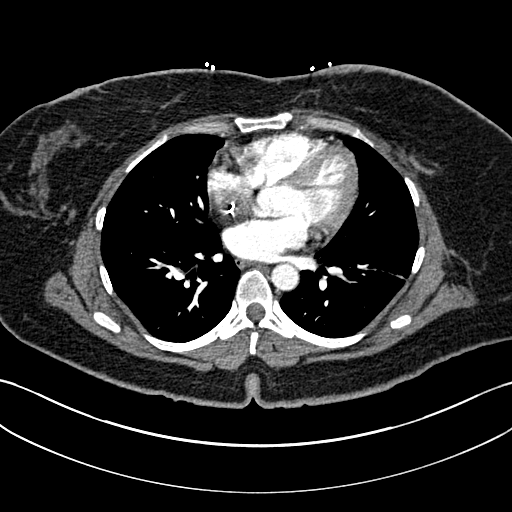
[im 93/206  lung]
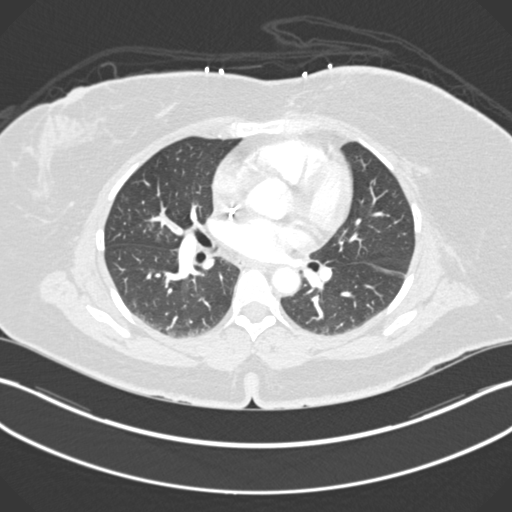
[im 103/206  mediastinal]
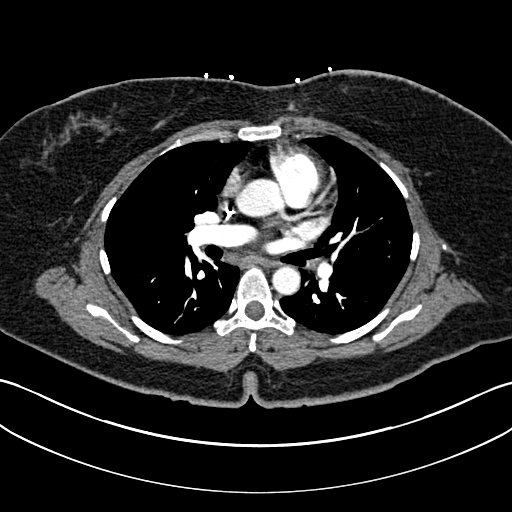
[im 113/206  lung]
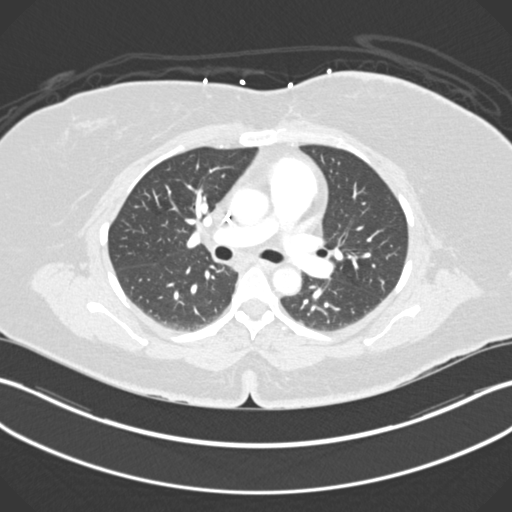
[im 124/206  mediastinal]
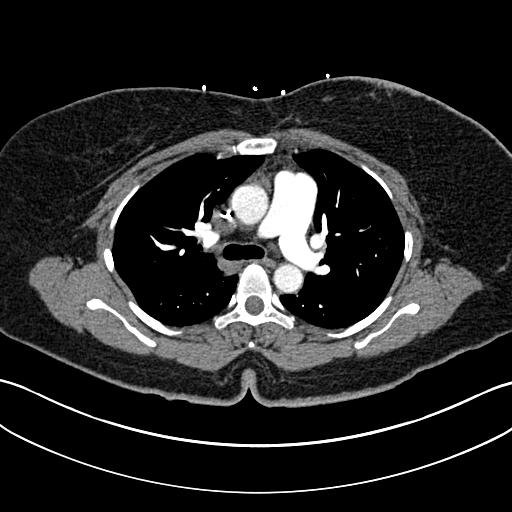
[im 134/206  lung]
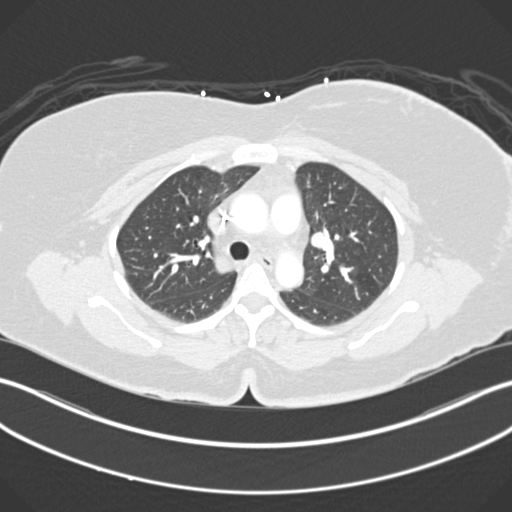
[im 137/206  mediastinal]
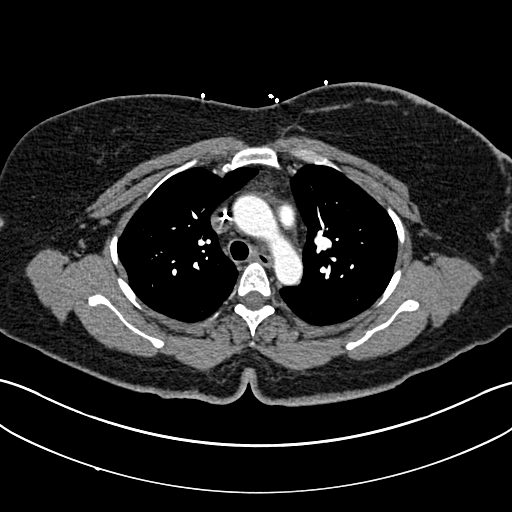
[im 144/206  lung]
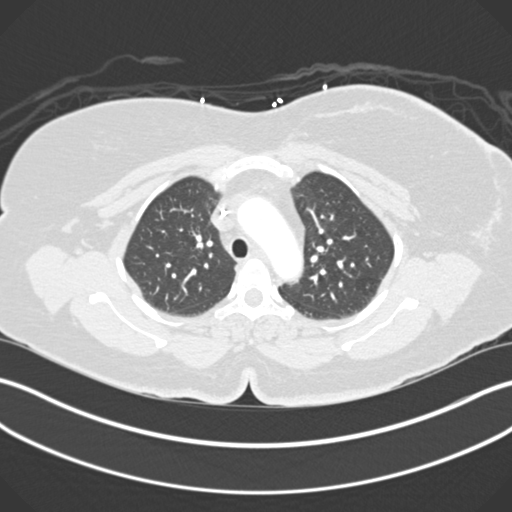
[im 154/206  mediastinal]
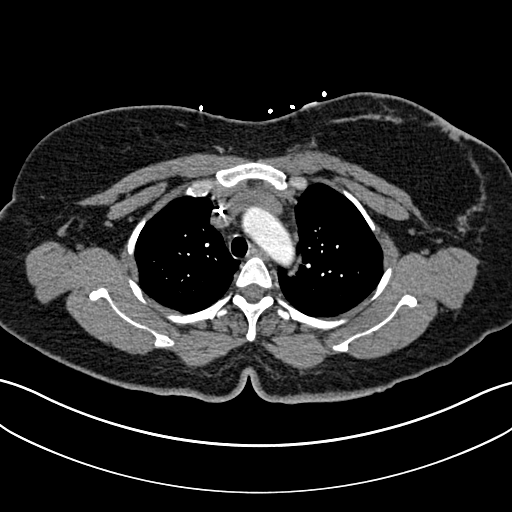
[im 175/206  lung]
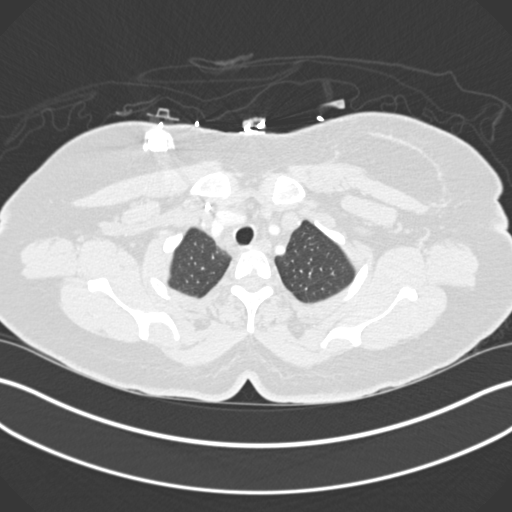
[im 185/206  mediastinal]
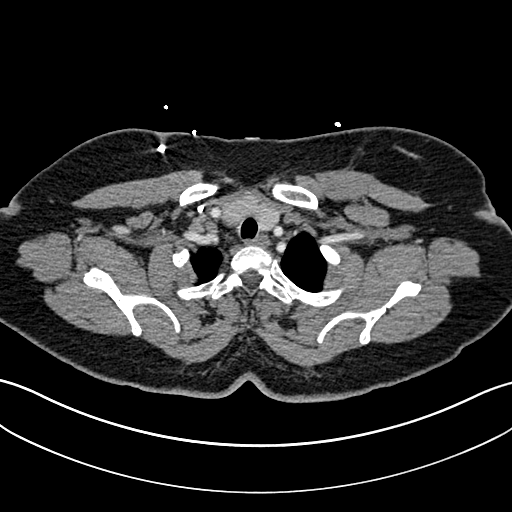
[im 195/206  lung]
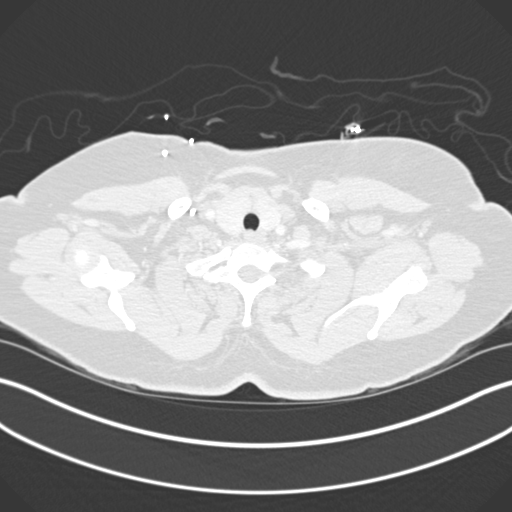

[19 of 32 positions shown; findings below may reference images not displayed]

FINDINGS: Bones: Negative for compression fracture. No aggressive osseous
lesions.

Cardiovascular: Technically adequate study. Negative for pulmonary
embolism. Three vessel aortic arch appears normal. RIGHT subclavian
Port-A-Cath.

Lungs: Dependent atelectasis in the RIGHT lung. Dependent
atelectasis in the LEFT lung. Subsegmental scarring in the anterior
LEFT lower lobe that appears similar to the prior PET-CT.

Central airways: Patent.

Effusions: None.

Lymphadenopathy: No axillary adenopathy. Postoperative changes of
LEFT breast reconstruction. No mediastinal adenopathy. Scarring
extends to the LEFT axilla.

Esophagus: Normal.

Upper abdomen: Normal. Accessory splenic tissue near the splenic
hilum.

Other: None.

Review of the MIP images confirms the above findings.
IMPRESSION: Negative for pulmonary embolism or acute cardiopulmonary disease.
Dependent atelectasis and LEFT lower lobe scarring.

## 2016-05-17 ENCOUNTER — Other Ambulatory Visit: Payer: Self-pay | Admitting: Family Medicine

## 2016-05-17 ENCOUNTER — Other Ambulatory Visit (HOSPITAL_COMMUNITY)
Admission: RE | Admit: 2016-05-17 | Discharge: 2016-05-17 | Disposition: A | Discharge status: Home / Self Care | Payer: BLUE CROSS/BLUE SHIELD | Source: Ambulatory Visit | Attending: Family Medicine | Admitting: Family Medicine

## 2016-05-17 DIAGNOSIS — Z124 Encounter for screening for malignant neoplasm of cervix: Secondary | ICD-10-CM | POA: Insufficient documentation

## 2016-05-17 DIAGNOSIS — R102 Pelvic and perineal pain: Secondary | ICD-10-CM

## 2016-05-21 LAB — CYTOLOGY - PAP

## 2016-05-24 ENCOUNTER — Ambulatory Visit
Admission: RE | Admit: 2016-05-24 | Discharge: 2016-05-24 | Disposition: A | Discharge status: Home / Self Care | Payer: BLUE CROSS/BLUE SHIELD | Source: Ambulatory Visit | Attending: Family Medicine | Admitting: Family Medicine

## 2016-05-24 DIAGNOSIS — R102 Pelvic and perineal pain: Secondary | ICD-10-CM

## 2016-08-14 ENCOUNTER — Other Ambulatory Visit: Payer: Self-pay | Admitting: General Surgery

## 2016-08-14 DIAGNOSIS — Z853 Personal history of malignant neoplasm of breast: Secondary | ICD-10-CM

## 2016-08-17 ENCOUNTER — Other Ambulatory Visit: Payer: Self-pay | Admitting: Hematology and Oncology

## 2016-09-10 ENCOUNTER — Ambulatory Visit: Payer: BLUE CROSS/BLUE SHIELD | Admitting: Hematology and Oncology

## 2016-09-10 ENCOUNTER — Telehealth: Payer: Self-pay | Admitting: *Deleted

## 2016-09-10 NOTE — Assessment & Plan Note (Deleted)
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 clinical stage ER 7% PR 0% HER-2 positive ratio 3.84 in the reconstructed left breast. Completed 6 cycles of of TCHP, currently on Herceptin maintenance that will complete November 2016  Post neoadjuvant MRI: Decrease in size of the mass from 4 cm to 2.8 cm; Final pathology counseling: pathologic complete response based on lumpectomy March 2016  Completed radiation thearpy in June 2016 Started tamoxifen June 2016 Herceptin maintenance completed Nov 7th 2016 (ECHO 9/16: EF 55-60%)  Current treatment: Tamoxifen 20 mg daily started June 2016  Tamoxifen Toxicities: tolerating the tamoxifen relatively well so far.  1.Hot flashes that wake her up at night, but otherwise has no issues.   Breast cancer surveillance:  Mammogram Oct 2016: No suspicious masses; next mammogram scheduled for 09/20/2016 Breast exam: 09/10/2016  no palpable lumps or nodules. Hemorrhagic ovarian cyst: Patient being evaluated by GYN  RTC 6 months for follow-up.

## 2016-09-10 NOTE — Telephone Encounter (Signed)
Pt called to cancel appt for today.  She cannot come.  She will call back to reschedule.

## 2016-09-17 ENCOUNTER — Encounter: Payer: Self-pay | Admitting: Hematology and Oncology

## 2016-09-17 ENCOUNTER — Ambulatory Visit (HOSPITAL_BASED_OUTPATIENT_CLINIC_OR_DEPARTMENT_OTHER): Payer: BLUE CROSS/BLUE SHIELD | Admitting: Hematology and Oncology

## 2016-09-17 DIAGNOSIS — Z79810 Long term (current) use of selective estrogen receptor modulators (SERMs): Secondary | ICD-10-CM | POA: Diagnosis not present

## 2016-09-17 DIAGNOSIS — C50412 Malignant neoplasm of upper-outer quadrant of left female breast: Secondary | ICD-10-CM | POA: Diagnosis not present

## 2016-09-17 DIAGNOSIS — N951 Menopausal and female climacteric states: Secondary | ICD-10-CM | POA: Diagnosis not present

## 2016-09-17 DIAGNOSIS — Z170 Estrogen receptor positive status [ER+]: Principal | ICD-10-CM

## 2016-09-17 NOTE — Assessment & Plan Note (Signed)
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 clinical stage ER 7% PR 0% HER-2 positive ratio 3.84 in the reconstructed left breast. Completed 6 cycles of of TCHP, currently on Herceptin maintenance that will complete November 2016  Post neoadjuvant MRI: Decrease in size of the mass from 4 cm to 2.8 cm; Final pathology counseling: pathologic complete response based on lumpectomy March 2016  Completed radiation thearpy in June 2016 Herceptin maintenance completed Nov 7th 2016 (ECHO 9/16: EF 55-60%) Started tamoxifen June 2016  Current treatment: Tamoxifen daily started June 2016  Tamoxifen Toxicities: tolerating the tamoxifen relatively well so far.  1.Hot flashes that wake her up at night, but otherwise has no issues.   Breast cancer surveillance:  Mammogram appointment 09/20/2016 Breast exam: 03/11/2016  no palpable lumps or nodules. Hemorrhagic ovarian cyst: Patient being evaluated by GYN  RTC 6 months for follow-up and after that annually.

## 2016-09-17 NOTE — Progress Notes (Signed)
Patient Care Team: No Pcp Per Patient as PCP - General (General Practice)  DIAGNOSIS:  Encounter Diagnosis  Name Primary?  . 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 The genes tested were ATM, BARD1, BRCA1, BRCA2, BRIP1, CDH1, CHEK2, MRE11A, MUTYH, NBN, NF1, PALB2, PTEN, RAD50, RAD51C, RAD51D, and TP53.      01/30/2015 Breast MRI    Left breast: Masses decrease in size from 4 cm to 2.8 cm abuts the pectoralis muscle      02/07/2015 -  Chemotherapy    Herceptin maintenance therapy to complete November 2016      02/09/2015 Surgery    Left breast lumpectomy: Pathologic complete response      03/13/2015 - 05/02/2015 Radiation Therapy    Adjuvant radiation therapy      05/30/2015 -  Anti-estrogen oral therapy    Tamoxifen 20 mg daily       CHIEF COMPLIANT: follow-up on tamoxifen therapy  INTERVAL HISTORY: Aimee Poole is a 45 year old with above-mentioned history of right left breast cancer treated with neoadjuvant chemotherapy followed by lumpectomy on 02/09/2015. She had pathologic complete response. She had adjuvant radiation therapy and is currently on oral antiestrogen  therapy with tamoxifen. She is tolerating tamoxifen extremely well. She initially had lots of hot flashes these have subsided. She is staying very busy with her work and does not seem to have any time for exercise.patient is concerned about her weight gain issues.  REVIEW OF SYSTEMS:   Constitutional: Denies fevers, chills or abnormal weight loss Eyes: Denies blurriness of vision Ears, nose, mouth, throat, and face: Denies mucositis or sore throat Respiratory: Denies cough, dyspnea or wheezes Cardiovascular: Denies palpitation, chest discomfort Gastrointestinal:  Denies nausea, heartburn or change in bowel habits Skin: Denies abnormal skin rashes Lymphatics: Denies new lymphadenopathy or easy bruising Neurological:Denies numbness, tingling or new weaknesses Behavioral/Psych: Mood is stable, no new changes  Extremities: No lower extremity edema Breast:  denies any pain or lumps or nodules in either breasts All other systems were reviewed with the patient and are negative.  I have reviewed the past medical history, past surgical history, social history and family history with the patient and they are unchanged from previous note.  ALLERGIES:  is allergic to corn-containing products.  MEDICATIONS:  Current Outpatient Prescriptions  Medication Sig Dispense Refill  . diphenhydramine-acetaminophen (TYLENOL PM) 25-500 MG TABS Take 1 tablet by mouth at bedtime as needed (sleep).     . Emollient (AQUAPHOR ADVANCED THERAPY) OINT Take 1 application by mouth daily as needed (dry hands and feet.). Hands and feet  0  . lidocaine-prilocaine (EMLA) cream Apply 1 application topically as needed. (Patient not taking: Reported on 02/17/2016) 30 g 3  . LORazepam (ATIVAN) 0.5 MG tablet Take 0.5 mg by mouth daily as needed (nausea).   0  . oxyCODONE-acetaminophen (PERCOCET/ROXICET) 5-325 MG tablet Take 1 tablet by mouth every 4 (four) hours as needed. 15 tablet 0  . tamoxifen (NOLVADEX) 20 MG tablet Take 1  tablet (20 mg total) by mouth daily. (Patient not taking: Reported on 02/17/2016) 90 tablet 3  . tamoxifen (NOLVADEX) 20 MG tablet TAKE 1 TABLET (20 MG TOTAL) BY MOUTH DAILY. 90 tablet 2   No current facility-administered medications for this visit.     PHYSICAL EXAMINATION: ECOG PERFORMANCE STATUS: 1 - Symptomatic but completely ambulatory  Vitals:   09/17/16 0856  BP: 119/75  Pulse: 83  Resp: 18  Temp: 98.7 F (37.1 C)   Filed Weights   09/17/16 0856  Weight: 190 lb 9.6 oz (86.5 kg)    GENERAL:alert, no distress and comfortable SKIN: skin color, texture, turgor are normal, no rashes or significant lesions EYES: normal, Conjunctiva are pink and non-injected, sclera clear OROPHARYNX:no exudate, no erythema and lips, buccal mucosa, and tongue normal  NECK: supple, thyroid normal size, non-tender, without nodularity LYMPH:  no palpable lymphadenopathy in the cervical, axillary or inguinal LUNGS: clear to auscultation and percussion with normal breathing effort HEART: regular rate & rhythm and no murmurs and no lower extremity edema ABDOMEN:abdomen soft, non-tender and normal bowel sounds MUSCULOSKELETAL:no cyanosis of digits and no clubbing  NEURO: alert & oriented x 3 with fluent speech, no focal motor/sensory deficits EXTREMITIES: No lower extremity edema   LABORATORY DATA:  I have reviewed the data as listed   Chemistry      Component Value Date/Time   NA 141 02/17/2016 1747   NA 142 09/11/2015 1251   K 3.9 02/17/2016 1747   K 3.6 09/11/2015 1251   CL 111 02/17/2016 1747   CO2 22 02/17/2016 1747   CO2 26 09/11/2015 1251   BUN 9 02/17/2016 1747   BUN 9.1 09/11/2015 1251   CREATININE 0.64 02/17/2016 1747   CREATININE 0.7 09/11/2015 1251      Component Value Date/Time   CALCIUM 10.1 02/17/2016 1747   CALCIUM 9.9 09/11/2015 1251   ALKPHOS 125 09/11/2015 1251   AST 16 09/11/2015 1251   ALT 11 09/11/2015 1251   BILITOT <0.30 09/11/2015 1251       Lab Results    Component Value Date   WBC 5.6 02/17/2016   HGB 12.4 02/17/2016   HCT 35.3 (L) 02/17/2016   MCV 86.1 02/17/2016   PLT 250 02/17/2016   NEUTROABS 3.1 02/17/2016     ASSESSMENT & PLAN:  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 clinical stage ER 7% PR 0% HER-2 positive ratio 3.84 in the reconstructed left breast. Completed 6 cycles of of TCHP, currently on Herceptin maintenance that will complete November 2016  Post neoadjuvant MRI: Decrease in size of the mass from 4 cm to 2.8 cm; Final pathology counseling: pathologic complete response based on lumpectomy March 2016  Completed radiation thearpy in June 2016 Herceptin maintenance completed Nov 7th 2016 (ECHO 9/16: EF 55-60%) Started tamoxifen June 2016  Current treatment: Tamoxifen daily started June 2016  Tamoxifen Toxicities: tolerating the tamoxifen relatively well so far.  1.Hot flashes that wake her up at night, but otherwise has  no issues.   Breast cancer surveillance:  Mammogram appointment 09/20/2016 Breast exam: 03/11/2016  no palpable lumps or nodules. Hemorrhagic ovarian cyst: Patient being evaluated by GYN  RTC 6 months for follow-up and after that annually.   No orders of the defined types were placed in this encounter.  The patient has a good understanding of the overall plan. she agrees with it. she will call with any problems that may develop before the next visit here.   Rulon Eisenmenger, MD 09/17/16

## 2016-09-20 ENCOUNTER — Ambulatory Visit
Admission: RE | Admit: 2016-09-20 | Discharge: 2016-09-20 | Disposition: A | Discharge status: Home / Self Care | Payer: BLUE CROSS/BLUE SHIELD | Source: Ambulatory Visit | Attending: General Surgery | Admitting: General Surgery

## 2016-09-20 DIAGNOSIS — Z853 Personal history of malignant neoplasm of breast: Secondary | ICD-10-CM

## 2016-11-21 ENCOUNTER — Other Ambulatory Visit: Payer: Self-pay | Admitting: Nurse Practitioner

## 2017-02-18 ENCOUNTER — Emergency Department (HOSPITAL_COMMUNITY)
Admission: EM | Admit: 2017-02-18 | Discharge: 2017-02-18 | Disposition: A | Discharge status: Home / Self Care | Payer: BLUE CROSS/BLUE SHIELD | Attending: Emergency Medicine | Admitting: Emergency Medicine

## 2017-02-18 ENCOUNTER — Encounter (HOSPITAL_COMMUNITY): Payer: Self-pay

## 2017-02-18 DIAGNOSIS — Z79899 Other long term (current) drug therapy: Secondary | ICD-10-CM | POA: Insufficient documentation

## 2017-02-18 DIAGNOSIS — M778 Other enthesopathies, not elsewhere classified: Secondary | ICD-10-CM

## 2017-02-18 DIAGNOSIS — Z853 Personal history of malignant neoplasm of breast: Secondary | ICD-10-CM | POA: Insufficient documentation

## 2017-02-18 DIAGNOSIS — M25521 Pain in right elbow: Secondary | ICD-10-CM | POA: Diagnosis present

## 2017-02-18 MED ORDER — NAPROXEN 500 MG PO TABS: 500.0000 mg | ORAL_TABLET | Freq: Two times a day (BID) | ORAL | 0 refills | Status: DC

## 2017-02-18 NOTE — ED Notes (Signed)
Bed: WTR9 Expected date:  Expected time:  Means of arrival:  Comments: 

## 2017-02-18 NOTE — ED Provider Notes (Signed)
[facility] DEPT Provider Note   CSN: [id number] Arrival date & time: 02/18/17  4166   By signing my name below, I, Neta Mends, attest that this documentation has been prepared under the direction and in the presence of Quincy Carnes, PA-C. Electronically Signed: Neta Mends, ED Scribe. 02/18/2017. 10:30 AM.   History   Chief Complaint Chief Complaint  Patient presents with  . Arm Pain   The history is provided by the patient. No language interpreter was used.   HPI Comments:  Aimee Poole is a 46 y.o. female who presents to the Emergency Department complaining of ongoing, worsening right arm pain x 3 months. Pt reports that she has been having "shooting" pain radiating from her right elbow to her right hand. She states that the pain is worse when laying on her right arm or attempting to pick items up with her right hand. Pt Reports associated paresthesias of her fingertips. She had no preceding injury prior to onset of symptoms. States she has been going to the gym several times a week and using the elliptical, lifting weights, using resistance band, etc. This seems to make her symptoms worse. Patient is right-hand dominant. She denies any right arm weakness. No chest pain or shortness of breath. She has not been evaluated for this issue previously. No meds tried PTA.  Past Medical History:  Diagnosis Date  . Cough 02/02/2015   productive of yellow mucus  . History of chemotherapy    finished chemo 01/17/2015  . Recurrent cancer of left breast ([place]) 01/2015    Patient Active Problem List   Diagnosis Date Noted  . Hot flashes 07/10/2015  . Genetic testing 02/01/2015  . Orthostatic hypotension 01/27/2015  . Dehydration 10/13/2014  . Central line complication 05/24/1600  . Diarrhea 10/13/2014  . Family history of breast cancer   . Breast cancer of upper-outer quadrant of left female breast ([place]) 03/13/2012  . History of breast cancer in female 09/24/2011     Past Surgical History:  Procedure Laterality Date  . BREAST LUMPECTOMY WITH RADIOACTIVE SEED LOCALIZATION Left 02/09/2015   Procedure: LEFT BREAST LUMPECTOMY WITH RADIOACTIVE SEED LOCALIZATION;  Surgeon: Rolm Bookbinder, MD;  Location: [facility];  Service: General;  Laterality: Left;  . BREAST REDUCTION SURGERY Right 05/30/2010  . CESAREAN SECTION  2008   x 1  . CYSTECTOMY     ovary as well as scar tissue  . ECTOPIC PREGNANCY SURGERY Left 04/18/2006   partial exc. distal tube  . INCISIONAL HERNIA REPAIR  01/23/2010  . MASTECTOMY Left 11/03/2009  . PORTACATH PLACEMENT    . RECONSTRUCTION BREAST W/ TRAM FLAP Left 01/23/2010  . REVISION RECONSTRUCTED BREAST Left 05/30/2010  . UTERINE FIBROID SURGERY    . VASCULAR DELAY PRE-TRAM Left 11/03/2009    OB History    No data available       Home Medications    Prior to Admission medications   Medication Sig Start Date End Date Taking? Authorizing Provider  diphenhydramine-acetaminophen (TYLENOL PM) 25-500 MG TABS Take 1 tablet by mouth at bedtime as needed (sleep).     Historical Provider, MD  Emollient (AQUAPHOR ADVANCED THERAPY) OINT Take 1 application by mouth daily as needed (dry hands and feet.). Hands and feet 01/26/15   Historical Provider, MD  lidocaine-prilocaine (EMLA) cream Apply 1 application topically as needed. Patient not taking: Reported on 02/17/2016 05/30/15   Nicholas Lose, MD  LORazepam (ATIVAN) 0.5 MG tablet Take 0.5 mg by mouth daily  as needed (nausea).  01/16/15   Historical Provider, MD  oxyCODONE-acetaminophen (PERCOCET/ROXICET) 5-325 MG tablet Take 1 tablet by mouth every 4 (four) hours as needed. 02/18/16   Larene Pickett, PA-C  tamoxifen (NOLVADEX) 20 MG tablet Take 1 tablet (20 mg total) by mouth daily. Patient not taking: Reported on 02/17/2016 10/02/15   Nicholas Lose, MD  tamoxifen (NOLVADEX) 20 MG tablet TAKE 1 TABLET (20 MG TOTAL) BY MOUTH DAILY. 08/19/16   Nicholas Lose, MD    Family History Family  History  Problem Relation Age of Onset  . Colon cancer Maternal Uncle   . Breast cancer Maternal Aunt 59    currently 69    Social History Social History  Substance Use Topics  . Smoking status: Never Smoker  . Smokeless tobacco: Never Used  . Alcohol use No     Allergies   Corn-containing products   Review of Systems Review of Systems  Musculoskeletal: Positive for arthralgias and myalgias.  Neurological: Positive for numbness.  All other systems reviewed and are negative.    Physical Exam Updated Vital Signs BP 123/84 (BP Location: Left Arm)   Pulse 81   Temp 98.4 F (36.9 C) (Oral)   Resp 18   Ht 5\' 2"  (1.575 m)   Wt 194 lb (88 kg)   SpO2 98%   BMI 35.48 kg/m   Physical Exam  Constitutional: She is oriented to person, place, and time. She appears well-developed and well-nourished.  HENT:  Head: Normocephalic and atraumatic.  Mouth/Throat: Oropharynx is clear and moist.  Eyes: Conjunctivae and EOM are normal. Pupils are equal, round, and reactive to light.  Neck: Normal range of motion.  Cardiovascular: Normal rate, regular rhythm and normal heart sounds.   Pulmonary/Chest: Effort normal and breath sounds normal.  Abdominal: Soft. Bowel sounds are normal.  Musculoskeletal: Normal range of motion.  Right elbow is normal in appearance without visible swelling or bony deformity, there are no overlying skin changes, no focal tenderness, pain is reproduced with pulling mechanism of the right arm, no apparent pain with pushing, full range of motion of the elbow, no crepitus, normal grip strength, no wrist drop, normal cap refill and sensation to all fingers  Neurological: She is alert and oriented to person, place, and time.  Skin: Skin is warm and dry.  Psychiatric: She has a normal mood and affect.  Nursing note and vitals reviewed.    ED Treatments / Results  DIAGNOSTIC STUDIES:  Oxygen Saturation is 98% on RA, normal by my interpretation.     COORDINATION OF CARE:  10:30 AM Discussed treatment plan with pt at bedside and pt agreed to plan.   Labs (all labs ordered are listed, but only abnormal results are displayed) Labs Reviewed - No data to display  EKG  EKG Interpretation None       Radiology No results found.  Procedures Procedures (including critical care time)  Medications Ordered in ED Medications - No data to display   Initial Impression / Assessment and Plan / ED Course  I have reviewed the triage vital signs and the nursing notes.  Pertinent labs & imaging results that were available during my care of the patient were reviewed by me and considered in my medical decision making (see chart for details).  46 year old female here with right elbow pain for the past 3 months. Based on history and exam findings, this is likely a tendinitis. She has no joint swelling, erythema, or warmth to touch to suggest  septic joint. No recent injury or trauma to suggest fracture or dislocation and there is no deformity on exam. Her arm is neurovascularly intact, compartments are soft, no wrist drop or other deficits. I recommended to use supportive brace, especially during physical activity.  Will start on anti-inflammatories. I recommended that she follow-up closely with her primary care doctor. She was also given referral to orthopedics if symptoms are worsening.  Discussed plan with patient,  she acknowledged understanding and agreed with plan of care.  Return precautions given for new or worsening symptoms.  Final Clinical Impressions(s) / ED Diagnoses   Final diagnoses:  Right elbow tendonitis    New Prescriptions Discharge Medication List as of 02/18/2017 10:36 AM    START taking these medications   Details  naproxen (NAPROSYN) 500 MG tablet Take 1 tablet (500 mg total) by mouth 2 (two) times daily with a meal., Starting Tue 02/18/2017, Print       I personally performed the services described in this  documentation, which was scribed in my presence. The recorded information has been reviewed and is accurate.   Larene Pickett, PA-C 02/18/17 [place], MD 02/18/17 1400

## 2017-02-18 NOTE — ED Triage Notes (Signed)
Pt here with rt arm pain.  Pain has been there since December.  No new injury.  Pt states it aches.  Has not had evaluated in past.

## 2017-02-18 NOTE — Discharge Instructions (Signed)
Take the prescribed medication as directed. Recommend to wear elbow tendonitis brace-- available at [facility], [facility], etc.  Follow-up with your primary care doctor.  If they are unable to assist you or symptoms worsening, may need to see orthopedics.  Dr. Lorin Mercy is on call today so may follow-up with him. Return to the ED for new or worsening symptoms.

## 2017-03-20 NOTE — Assessment & Plan Note (Signed)
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 clinical stage ER 7% PR 0% HER-2 positive ratio 3.84 in the reconstructed left breast. Completed 6 cycles of of TCHP, currently on Herceptin maintenance that will complete November 2016  Post neoadjuvant MRI: Decrease in size of the mass from 4 cm to 2.8 cm; Final pathology counseling: pathologic complete response based on lumpectomy March 2016  Completed radiation thearpy in June 2016 Herceptin maintenance completed Nov 7th 2016 (ECHO 9/16: EF 55-60%) Started tamoxifen June 2016  Current treatment: Tamoxifen daily started June 2016  Tamoxifen Toxicities: tolerating the tamoxifen relatively well so far.  1.Hot flashes that wake her up at night, but otherwise has no issues.   Breast cancer surveillance:  Mammogram appointment 09/20/2016 Breast exam: 03/21/2017 no palpable lumps or nodules. Hemorrhagic ovarian cyst: Patient being evaluated by GYN  RTC in 1 year

## 2017-03-21 ENCOUNTER — Ambulatory Visit (HOSPITAL_BASED_OUTPATIENT_CLINIC_OR_DEPARTMENT_OTHER): Payer: BLUE CROSS/BLUE SHIELD | Admitting: Hematology and Oncology

## 2017-03-21 ENCOUNTER — Encounter: Payer: Self-pay | Admitting: Hematology and Oncology

## 2017-03-21 DIAGNOSIS — Z79810 Long term (current) use of selective estrogen receptor modulators (SERMs): Secondary | ICD-10-CM

## 2017-03-21 DIAGNOSIS — C50412 Malignant neoplasm of upper-outer quadrant of left female breast: Secondary | ICD-10-CM | POA: Diagnosis not present

## 2017-03-21 DIAGNOSIS — Z170 Estrogen receptor positive status [ER+]: Secondary | ICD-10-CM | POA: Diagnosis not present

## 2017-03-21 MED ORDER — TAMOXIFEN CITRATE 20 MG PO TABS: ORAL_TABLET | ORAL | 3 refills | Status: DC

## 2017-03-21 NOTE — Progress Notes (Signed)
Patient Care Team: No Pcp Per Patient as PCP - General (General Practice)  DIAGNOSIS:  Encounter Diagnosis  Name Primary?  . 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 The genes tested were ATM, BARD1, BRCA1, BRCA2, BRIP1, CDH1, CHEK2, MRE11A, MUTYH, NBN, NF1, PALB2, PTEN, RAD50, RAD51C, RAD51D, and TP53.      01/30/2015 Breast MRI    Left breast: Masses decrease in size from 4 cm to 2.8 cm abuts the pectoralis muscle      02/07/2015 -  Chemotherapy    Herceptin maintenance therapy to complete November 2016      02/09/2015 Surgery    Left breast lumpectomy: Pathologic complete response      03/13/2015 - 05/02/2015 Radiation Therapy    Adjuvant radiation therapy      05/30/2015 -  Anti-estrogen oral therapy    Tamoxifen 20 mg daily       CHIEF COMPLIANT: Surveillance of breast cancer on tamoxifen therapy  INTERVAL HISTORY: Aimee Poole is a 46 year old with above-mentioned history of left breast cancer currently on adjuvant tamoxifen. She is tolerating it fairly well. She denies any lumps or nodules in the breasts. She is complaining of tendinitis in the right elbow. She  recently lost her job and is looking for another job.  REVIEW OF SYSTEMS:   Constitutional: Denies fevers, chills or abnormal weight loss Eyes: Denies blurriness of vision Ears, nose, mouth, throat, and face: Denies mucositis or sore throat Respiratory: Denies cough, dyspnea or wheezes Cardiovascular: Denies palpitation, chest discomfort Gastrointestinal:  Denies nausea, heartburn or change in bowel habits Skin: Denies abnormal skin rashes Lymphatics: Denies new lymphadenopathy or easy bruising Neurological:Denies numbness, tingling or new weaknesses Behavioral/Psych: Mood is stable, no new changes  Extremities: No lower extremity edema Breast:  denies any pain or lumps or nodules in either breasts All other systems were reviewed with the patient and are negative.  I have reviewed the past medical history, past surgical history, social history and family history with the patient and they are unchanged from previous note.  ALLERGIES:  is allergic to corn-containing products.  MEDICATIONS:  Current Outpatient Prescriptions  Medication Sig Dispense Refill  . diphenhydramine-acetaminophen (TYLENOL PM) 25-500 MG TABS Take 1 tablet by mouth at bedtime as needed (sleep).     . naproxen (NAPROSYN) 500 MG tablet Take 1 tablet (500 mg total) by mouth 2 (two) times daily with a meal. 30 tablet 0  . tamoxifen (NOLVADEX) 20 MG tablet TAKE 1 TABLET (20 MG TOTAL) BY MOUTH DAILY. 90 tablet 3   No current facility-administered medications for  this visit.     PHYSICAL EXAMINATION: ECOG PERFORMANCE STATUS: 1 - Symptomatic but completely ambulatory  Vitals:   03/21/17 0830  BP: 118/66  Pulse: 95  Resp: 18  Temp: 98.2 F (36.8 C)   Filed Weights   03/21/17 0830  Weight: 191 lb 9.6 oz (86.9 kg)    GENERAL:alert, no distress and comfortable SKIN: skin color, texture, turgor are normal, no rashes or significant lesions EYES: normal, Conjunctiva are pink and non-injected, sclera  clear OROPHARYNX:no exudate, no erythema and lips, buccal mucosa, and tongue normal  NECK: supple, thyroid normal size, non-tender, without nodularity LYMPH:  no palpable lymphadenopathy in the cervical, axillary or inguinal LUNGS: clear to auscultation and percussion with normal breathing effort HEART: regular rate & rhythm and no murmurs and no lower extremity edema ABDOMEN:abdomen soft, non-tender and normal bowel sounds MUSCULOSKELETAL: Tendinitis in the right elbow  NEURO: alert & oriented x 3 with fluent speech, no focal motor/sensory deficits EXTREMITIES: No lower extremity edema BREAST: No palpable masses or nodules in either right or left breasts. No palpable axillary supraclavicular or infraclavicular adenopathy no breast tenderness or nipple discharge. (exam performed in the presence of a chaperone)  LABORATORY DATA:  I have reviewed the data as listed   Chemistry      Component Value Date/Time   NA 141 02/17/2016 1747   NA 142 09/11/2015 1251   K 3.9 02/17/2016 1747   K 3.6 09/11/2015 1251   CL 111 02/17/2016 1747   CO2 22 02/17/2016 1747   CO2 26 09/11/2015 1251   BUN 9 02/17/2016 1747   BUN 9.1 09/11/2015 1251   CREATININE 0.64 02/17/2016 1747   CREATININE 0.7 09/11/2015 1251      Component Value Date/Time   CALCIUM 10.1 02/17/2016 1747   CALCIUM 9.9 09/11/2015 1251   ALKPHOS 125 09/11/2015 1251   AST 16 09/11/2015 1251   ALT 11 09/11/2015 1251   BILITOT <0.30 09/11/2015 1251       Lab Results  Component Value Date   WBC 5.6 02/17/2016   HGB 12.4 02/17/2016   HCT 35.3 (L) 02/17/2016   MCV 86.1 02/17/2016   PLT 250 02/17/2016   NEUTROABS 3.1 02/17/2016    ASSESSMENT & PLAN:  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 clinical stage ER 7% PR 0% HER-2 positive ratio 3.84 in the reconstructed left breast. Completed 6 cycles of of TCHP, currently on Herceptin  maintenance that will complete November 2016  Post neoadjuvant MRI: Decrease in size of the mass from 4 cm to 2.8 cm; Final pathology counseling: pathologic complete response based on lumpectomy March 2016  Completed radiation thearpy in June 2016 Herceptin maintenance completed Nov 7th 2016 (ECHO 9/16: EF 55-60%) Started tamoxifen June 2016  Current treatment: Tamoxifen daily started June 2016  Tamoxifen Toxicities: tolerating the tamoxifen relatively well so far.  1.Hot flashes that wake her up at night, but otherwise has no issues.   Breast cancer surveillance:  Mammogram 09/20/2016: No mammographic evidence of malignancy, breast density category B Breast exam: 03/21/2017 no palpable lumps or nodules. Hemorrhagic ovarian cyst and fibroids: Patient being evaluated by GYN  RTC in 1 year  I spent 25 minutes talking to the patient of which more than half was spent in counseling and coordination of care.  No orders of the defined types were placed in this encounter.  The patient has a good understanding  of the overall plan. she agrees with it. she will call with any problems that may develop before the next visit here.   Rulon Eisenmenger, MD 03/21/17

## 2017-08-13 ENCOUNTER — Other Ambulatory Visit: Payer: Self-pay | Admitting: General Surgery

## 2017-08-13 DIAGNOSIS — Z853 Personal history of malignant neoplasm of breast: Secondary | ICD-10-CM

## 2017-09-02 ENCOUNTER — Other Ambulatory Visit: Payer: Self-pay | Admitting: Physician Assistant

## 2017-09-02 ENCOUNTER — Ambulatory Visit
Admission: RE | Admit: 2017-09-02 | Discharge: 2017-09-02 | Disposition: A | Discharge status: Home / Self Care | Payer: BLUE CROSS/BLUE SHIELD | Source: Ambulatory Visit | Attending: Physician Assistant | Admitting: Physician Assistant

## 2017-09-02 DIAGNOSIS — R52 Pain, unspecified: Secondary | ICD-10-CM

## 2017-09-11 ENCOUNTER — Other Ambulatory Visit: Payer: Self-pay

## 2017-09-22 ENCOUNTER — Ambulatory Visit
Admission: RE | Admit: 2017-09-22 | Discharge: 2017-09-22 | Disposition: A | Discharge status: Home / Self Care | Payer: BLUE CROSS/BLUE SHIELD | Source: Ambulatory Visit | Attending: General Surgery | Admitting: General Surgery

## 2017-09-22 DIAGNOSIS — Z853 Personal history of malignant neoplasm of breast: Secondary | ICD-10-CM

## 2017-09-22 HISTORY — DX: Personal history of irradiation: Z92.3

## 2017-09-22 HISTORY — DX: Personal history of antineoplastic chemotherapy: Z92.21

## 2017-10-08 ENCOUNTER — Other Ambulatory Visit: Payer: Self-pay | Admitting: Obstetrics and Gynecology

## 2017-10-08 ENCOUNTER — Other Ambulatory Visit (HOSPITAL_COMMUNITY)
Admission: RE | Admit: 2017-10-08 | Discharge: 2017-10-08 | Disposition: A | Discharge status: Home / Self Care | Payer: BLUE CROSS/BLUE SHIELD | Source: Ambulatory Visit | Attending: Obstetrics and Gynecology | Admitting: Obstetrics and Gynecology

## 2017-10-08 DIAGNOSIS — Z124 Encounter for screening for malignant neoplasm of cervix: Secondary | ICD-10-CM | POA: Insufficient documentation

## 2017-10-09 ENCOUNTER — Other Ambulatory Visit: Payer: Self-pay | Admitting: Obstetrics and Gynecology

## 2017-10-09 DIAGNOSIS — R1032 Left lower quadrant pain: Secondary | ICD-10-CM

## 2017-10-10 LAB — CYTOLOGY - PAP
DIAGNOSIS: NEGATIVE
HPV (WINDOPATH): DETECTED — AB
HPV 16/18/45 GENOTYPING: NEGATIVE

## 2017-10-15 ENCOUNTER — Other Ambulatory Visit: Payer: BLUE CROSS/BLUE SHIELD

## 2017-10-22 ENCOUNTER — Other Ambulatory Visit: Payer: BLUE CROSS/BLUE SHIELD

## 2017-12-03 ENCOUNTER — Other Ambulatory Visit: Payer: Self-pay

## 2017-12-03 MED ORDER — TAMOXIFEN CITRATE 20 MG PO TABS: ORAL_TABLET | ORAL | 0 refills | Status: DC

## 2017-12-09 ENCOUNTER — Other Ambulatory Visit: Payer: Self-pay | Admitting: Hematology and Oncology

## 2017-12-24 ENCOUNTER — Other Ambulatory Visit: Payer: Self-pay

## 2017-12-24 DIAGNOSIS — Z170 Estrogen receptor positive status [ER+]: Principal | ICD-10-CM

## 2017-12-24 DIAGNOSIS — C50412 Malignant neoplasm of upper-outer quadrant of left female breast: Secondary | ICD-10-CM

## 2017-12-25 ENCOUNTER — Telehealth: Payer: Self-pay

## 2017-12-25 NOTE — Telephone Encounter (Signed)
Returned pt call regarding arm and hand swelling. Informed her I sent In a referral to the lymphedema clinic and they should be contacting her.  Cyndia Bent RN

## 2017-12-29 ENCOUNTER — Other Ambulatory Visit: Payer: Self-pay | Admitting: Obstetrics and Gynecology

## 2017-12-31 ENCOUNTER — Ambulatory Visit: Payer: Managed Care, Other (non HMO) | Attending: Hematology and Oncology | Admitting: Physical Therapy

## 2017-12-31 ENCOUNTER — Other Ambulatory Visit: Payer: Self-pay

## 2017-12-31 DIAGNOSIS — I972 Postmastectomy lymphedema syndrome: Secondary | ICD-10-CM

## 2017-12-31 DIAGNOSIS — M6281 Muscle weakness (generalized): Secondary | ICD-10-CM | POA: Diagnosis present

## 2017-12-31 DIAGNOSIS — R293 Abnormal posture: Secondary | ICD-10-CM

## 2017-12-31 NOTE — Therapy (Signed)
[place], [place], [address] Phone: [phone]   Fax:  [phone]  Physical Therapy Evaluation  Patient Details  Name: Aimee Poole MRN: [id number] Date of Birth: [birth year] Referring Provider: Dr. Lindi Adie    Encounter Date: 12/31/2017  PT End of Session - 12/31/17 0956    Visit Number  1    Number of Visits  13    Date for PT Re-Evaluation  02/06/18    PT Start Time  0849    PT Stop Time  0932    PT Time Calculation (min)  43 min    Activity Tolerance  Patient tolerated treatment well    Behavior During Therapy  [facility] for tasks assessed/performed       Past Medical History:  Diagnosis Date  . Breast cancer ([place]) 2010   left breast  . Breast cancer ([place]) 2015   recurrent left breast  . Cough 02/02/2015   productive of yellow mucus  . History of chemotherapy    finished chemo 01/17/2015  . Personal history of chemotherapy 2015  . Personal history of radiation therapy 2015  . Recurrent cancer of left breast ([place]) 01/2015    Past Surgical History:  Procedure Laterality Date  . BREAST LUMPECTOMY Left 2016  . BREAST LUMPECTOMY WITH RADIOACTIVE SEED LOCALIZATION Left 02/09/2015   Procedure: LEFT BREAST LUMPECTOMY WITH RADIOACTIVE SEED LOCALIZATION;  Surgeon: Rolm Bookbinder, MD;  Location: [place];  Service: General;  Laterality: Left;  . BREAST REDUCTION SURGERY Right 05/30/2010  . CESAREAN SECTION  2008   x 1  . CYSTECTOMY     ovary as well as scar tissue  . ECTOPIC PREGNANCY SURGERY Left 04/18/2006   partial exc. distal tube  . INCISIONAL HERNIA REPAIR  01/23/2010  . MASTECTOMY Left 11/03/2009   TRAM 2011  . PORTACATH PLACEMENT    . RECONSTRUCTION BREAST W/ TRAM FLAP Left 01/23/2010  . REDUCTION MAMMAPLASTY Right   . REVISION RECONSTRUCTED BREAST Left 05/30/2010  . UTERINE FIBROID SURGERY    . VASCULAR DELAY PRE-TRAM Left 11/03/2009    There were no vitals filed for this  visit.   Subjective Assessment - 12/31/17 0904    Subjective  Pt has left arm swelling for several months.  She can't get her wedding ring on.  "they thought it was tendinitis and I had steroid shots"  they didn't help .  She feels like she has pain in her lateral chest and where the incisions were whenever she tries to pick something up Sometimes her feet hurt too when she wakes      Pertinent History  left breast cancer twice on the left side and had a mastectomy in 2010 with 3 nodes removed and had a wound infection.  After that She had a TRAM flap on the left and had a right side breast reduction.  She had a recurrence of left breast cancer in 2015 closer to the chest with surgery, chemo and radiation She is currently being evaluated for possibly ovarian mass and will return to GYN doctor in 6 weeks     Patient Stated Goals  to get the swelling gone down and to get her wedding back on     Currently in Pain?  Yes    Pain Score  5     Pain Location  Arm    Pain Orientation  Left    Pain Descriptors / Indicators  Constant;Aching    Pain Type  Chronic pain    Pain Radiating Towards  the whole arm     Pain Onset  More than a month ago    Pain Frequency  Constant    Aggravating Factors   layin on it     Pain Relieving Factors  holding onto something when she is laying on it     Effect of Pain on Daily Activities  can't grip to open a jar          [facility] PT Assessment - 12/31/17 0001      Assessment   Medical Diagnosis  left breast cancer     Referring Provider  Dr. Lindi Adie     Onset Date/Surgical Date  10/25/09    Hand Dominance  Right      Precautions   Precautions  None      Restrictions   Weight Bearing Restrictions  No      Balance Screen   Has the patient fallen in the past 6 months  No pt feels like her balance is off     Has the patient had a decrease in activity level because of a fear of falling?   No    Is the patient reluctant to leave their home because of a fear of  falling?   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 ( 4th grade)  does not do any regular exercise, has an ellipical and a bike a home       Cognition   Overall Cognitive Status  Within Functional Limits for tasks assessed      Observation/Other Assessments   Skin Integrity  no open areas     Quick DASH   40.91      Observation/Other Assessments-Edema    Edema  -- pt with visible edema in left hand especially fingers       Sensation   Light Touch  Not tested    Additional Comments  pt feels varied burning and numbness along left arm       Coordination   Gross Motor Movements are Fluid and Coordinated  Yes      Posture/Postural Control   Posture/Postural Control  Postural limitations    Postural Limitations  Rounded Shoulders;Forward head    Posture Comments  pt with obesity       AROM   Right Shoulder Flexion  170 Degrees    Right Shoulder ABduction  170 Degrees    Left Shoulder Flexion  160 Degrees    Left Shoulder ABduction  160 Degrees      Strength   Overall Strength Comments  appears to have general deconditioning       Palpation   Palpation comment  some tightness perceived in left axilla and lateral left chest, but is not painful to patient         LYMPHEDEMA/ONCOLOGY QUESTIONNAIRE - 12/31/17 0924      Type   Cancer Type  left breast       Surgeries   Mastectomy Date  11/25/08    Tram Date  04/25/10 approximate     Number Lymph Nodes Removed  3      Treatment   Past Chemotherapy Treatment  Yes    Date  11/25/13    Past Radiation Treatment  Yes    Date  11/25/13      What other symptoms do you have   Are you having Pain  Yes     Are you having pitting edema  Yes minimal     Body Site  left hand     Is it Hard or Difficult finding clothes that fit  Yes    Do you have infections  No    Is there Decreased scar mobility  Yes    Stemmer Sign  Yes      Lymphedema Stage   Stage  -- pt reports it is worse at night and better in morning.      Right Upper Extremity Lymphedema   10 cm Proximal to Olecranon Process  32 cm    Olecranon Process  24.5 cm    15 cm Proximal to Ulnar Styloid Process  24.5 cm    10 cm Proximal to Ulnar Styloid Process  22 cm    Just Proximal to Ulnar Styloid Process  15.5 cm    Across Hand at [employer]  18.7 cm    At [place] of 2nd Digit  6 cm      Left Upper Extremity Lymphedema   10 cm Proximal to Olecranon Process  32.5 cm    Olecranon Process  24.5 cm    15 cm Proximal to Ulnar Styloid Process  24.5 cm    10 cm Proximal to Ulnar Styloid Process  22 cm    Just Proximal to Ulnar Styloid Process  16 cm    Across Hand at [employer]  19.5 cm    At [place] of 2nd Digit  7 cm        Quick Dash - 12/31/17 0001    Open a tight or new jar  Moderate difficulty    Do heavy household chores (wash walls, wash floors)  Moderate difficulty    Carry a shopping bag or briefcase  Moderate difficulty    Wash your back  Moderate difficulty    Use a knife to cut food  No difficulty    Recreational activities in which you take some force or impact through your arm, shoulder, or hand (golf, hammering, tennis)  No difficulty    During the past week, to what extent has your arm, shoulder or hand problem interfered with your normal social activities with family, friends, neighbors, or groups?  Modererately    During the past week, to what extent has your arm, shoulder or hand problem limited your work or other regular daily activities  Slightly    Arm, shoulder, or hand pain.  Moderate    Tingling (pins and needles) in your arm, shoulder, or hand  Moderate    Difficulty Sleeping  Severe difficulty     DASH Score  40.91 %       Objective measurements completed on examination: See above findings.              PT Education - 12/31/17 0955    Education provided  Yes    Education Details  encouraged pt to do walking, biking or elliptical exercise 30 min 5 times a week     Person(s) Educated  Patient    Methods  Explanation  Comprehension  Verbalized understanding          PT Long Term Goals - 12/31/17 1007      PT LONG TERM GOAL #1   Title  Pt will have reduction of circumference of left index finger to 6.5 cm     Baseline  7.0 cm     Time  4    Period  Weeks    Status  New      PT LONG TERM GOAL #2   Title  Pt will be able to self manage left arm lymphedema with self manual lymph drainage, self bandaging , exercise and use of compression garments     Time  4    Period  Weeks    Status  New      PT LONG TERM GOAL #3   Title  Pt will be independent in a home exercise program     Time  4    Period  Weeks    Status  New             Plan - 12/31/17 9628    Clinical Impression Statement  Pt presents with lymphedema in left hand and wrist, mostly in fingers.  She also has complaints of pain in arm, but explained to her that lymphedema is not usually a painful condition.  She has limitations of end range of left shoulder motion and appears to have generalized deconditioning and would benefit from strengthening  program. She admits she is not motivated to exercise, but will consider it.  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 , chronic pain issues     Clinical Presentation  Evolving    Clinical Presentation due to:  pt under evaluation for possible ovarian issue, pain and swelling in hand is persistent despite previous treatment      Clinical Decision Making  Moderate    Rehab Potential  Good    Clinical Impairments Affecting Rehab Potential  previous radiation , multipe surgeries and 3 lymph node removal,     PT Frequency  3x / week    PT Duration  4 weeks    PT Treatment/Interventions  ADLs/Self Care Home Management;Manual lymph drainage;Compression bandaging;Scar mobilization;Passive range of motion;Patient/family education;Therapeutic exercise;Therapeutic activities;Taping;Orthotic Fit/Training;Manual techniques    PT Next Visit Plan  Manual lymph drainage and remedial exercises and shoulder ROM HEP   Proceed with bandaging especially to hand if pt agrees and teach self bandaging.  Assist with getting garmnets, Teach supine scapular series and facilitate community exercise (?? prehab class at cancer center on Thrusdays?)  Tell pt about ABC class     Consulted and Agree with Plan of Care  Patient       Patient will benefit from skilled therapeutic intervention in order to improve the following deficits and impairments:  Decreased scar mobility, Decreased knowledge of precautions, Decreased knowledge of use of DME, Increased fascial restricitons, Pain, Increased edema, Postural dysfunction  Visit Diagnosis: Postmastectomy lymphedema - Plan: PT plan of care cert/re-cert  Abnormal posture - Plan: PT plan of care cert/re-cert  Muscle weakness (generalized) - Plan: PT plan of care cert/re-cert     Problem List Patient Active Problem List   Diagnosis Date Noted  . Hot flashes 07/10/2015  .  Genetic testing 02/01/2015  . Orthostatic hypotension 01/27/2015  . Dehydration 10/13/2014  . Central line complication 62/26/3335  . Diarrhea 10/13/2014  . Family history of breast cancer   . Breast cancer of upper-outer quadrant of left female breast ([place]) 03/13/2012  . History of breast cancer in female 09/24/2011   Donato Heinz. Owens Shark PT  [place]wood Levo 12/31/2017, 10:10 AM  [place], [place], [address] Phone: [phone]   Fax:  [phone]  Name: MORENIKE CUFF MRN: [id number] Date of Birth: [birth year]

## 2018-01-02 ENCOUNTER — Telehealth: Payer: Self-pay

## 2018-01-02 NOTE — Telephone Encounter (Signed)
Unable to leave a voice mail. Will mail letter of upcoming appointment changes. Per 2/8 rescheduling.

## 2018-01-05 ENCOUNTER — Ambulatory Visit: Payer: Managed Care, Other (non HMO) | Admitting: Physical Therapy

## 2018-01-05 ENCOUNTER — Encounter: Payer: Self-pay | Admitting: Physical Therapy

## 2018-01-05 DIAGNOSIS — R293 Abnormal posture: Secondary | ICD-10-CM

## 2018-01-05 DIAGNOSIS — I972 Postmastectomy lymphedema syndrome: Secondary | ICD-10-CM | POA: Diagnosis not present

## 2018-01-05 NOTE — Therapy (Signed)
[place], [place], [address] Phone: [phone]   Fax:  [phone]  Physical Therapy Treatment  Patient Details  Name: Aimee Poole MRN: [id number] Date of Birth: Dec 31, 1970 Referring Provider: Dr. Lindi Adie    Encounter Date: 01/05/2018  PT End of Session - 01/05/18 0849    Visit Number  2    Number of Visits  13    Date for PT Re-Evaluation  02/06/18    PT Start Time  0803    PT Stop Time  0846    PT Time Calculation (min)  43 min    Activity Tolerance  Patient tolerated treatment well    Behavior During Therapy  [facility] for tasks assessed/performed       Past Medical History:  Diagnosis Date  . Breast cancer ([place]) 2010   left breast  . Breast cancer ([place]) 2015   recurrent left breast  . Cough 02/02/2015   productive of yellow mucus  . History of chemotherapy    finished chemo 01/17/2015  . Personal history of chemotherapy 2015  . Personal history of radiation therapy 2015  . Recurrent cancer of left breast ([place]) 01/2015    Past Surgical History:  Procedure Laterality Date  . BREAST LUMPECTOMY Left 2016  . BREAST LUMPECTOMY WITH RADIOACTIVE SEED LOCALIZATION Left 02/09/2015   Procedure: LEFT BREAST LUMPECTOMY WITH RADIOACTIVE SEED LOCALIZATION;  Surgeon: Rolm Bookbinder, MD;  Location: [place];  Service: General;  Laterality: Left;  . BREAST REDUCTION SURGERY Right 05/30/2010  . CESAREAN SECTION  2008   x 1  . CYSTECTOMY     ovary as well as scar tissue  . ECTOPIC PREGNANCY SURGERY Left 04/18/2006   partial exc. distal tube  . INCISIONAL HERNIA REPAIR  01/23/2010  . MASTECTOMY Left 11/03/2009   TRAM 2011  . PORTACATH PLACEMENT    . RECONSTRUCTION BREAST W/ TRAM FLAP Left 01/23/2010  . REDUCTION MAMMAPLASTY Right   . REVISION RECONSTRUCTED BREAST Left 05/30/2010  . UTERINE FIBROID SURGERY    . VASCULAR DELAY PRE-TRAM Left 11/03/2009    There were no vitals filed for this  visit.  Subjective Assessment - 01/05/18 0805    Subjective  My arm is sore up here. When I raise my arm I dont have any pain.     Pertinent History  left breast cancer twice on the left side and had a mastectomy in 2010 with 3 nodes removed and had a wound infection.  After that She had a TRAM flap on the left and had a right side breast reduction.  She had a recurrence of left breast cancer in 2015 closer to the chest with surgery, chemo and radiation She is currently being evaluated for possibly ovarian mass and will return to GYN doctor in 6 weeks     Patient Stated Goals  to get the swelling gone down and to get her wedding back on     Currently in Pain?  Yes    Pain Score  5     Pain Location  Arm    Pain Orientation  Left                      [facility] Adult PT Treatment/Exercise - 01/05/18 0001      Shoulder Exercises: Supine   Flexion  AAROM;Both;5 reps with dowel with 15 sec hold    ABduction  AAROM;Left;5 reps with dowel with 10 sec holds  Manual Therapy   Manual Therapy  Manual Lymphatic Drainage (MLD);Compression Bandaging;Neural Stretch    Manual Lymphatic Drainage (MLD)  short neck, 5 diaphragmatic breaths, right axillary and left inguinal nodes, establishment of interaxillary and axillo inguinal pathways, LUE working proximal to distal with focus on fingers then retracing all steps    Compression Bandaging  instructed pt in the following and therapist demonstrated while pt return demonstrated and applied compression bandages herself: thick stockinette from hand to wrist, elastomull to all 5 fingers, artiflex from hand to wrist, 1 6 cm bandage to hand    Neural Stretch  to median nerve with wrist extended on wall                  PT Long Term Goals - 12/31/17 1007      PT LONG TERM GOAL #1   Title  Pt will have reduction of circumference of left index finger to 6.5 cm     Baseline  7.0 cm     Time  4    Period  Weeks    Status  New      PT  LONG TERM GOAL #2   Title  Pt will be able to self manage left arm lymphedema with self manual lymph drainage, self bandaging , exercise and use of compression garments     Time  4    Period  Weeks    Status  New      PT LONG TERM GOAL #3   Title  Pt will be independent in a home exercise program     Time  4    Period  Weeks    Status  New            Plan - 01/05/18 0849    Clinical Impression Statement  Began MLD today. Will instruct pt and issue handout on this at next session. Instructed pt in neural stretch and supine dowel stretches to help decrease UE pain. Instructed pt in compression bandaging for left hand as pt applied compression bandages today.     Rehab Potential  Good    Clinical Impairments Affecting Rehab Potential  previous radiation , multipe surgeries and 3 lymph node removal,     PT Frequency  3x / week    PT Duration  4 weeks    PT Treatment/Interventions  ADLs/Self Care Home Management;Manual lymph drainage;Compression bandaging;Scar mobilization;Passive range of motion;Patient/family education;Therapeutic exercise;Therapeutic activities;Taping;Orthotic Fit/Training;Manual techniques    PT Next Visit Plan  Assess indep with dowel exercises and neural stretch, Manual lymph drainage and remedial exercises and shoulder ROM HEP   Assess pt's independence with left hand bandaging. Assist with getting garmnets, Teach supine scapular series and facilitate community exercise (?? prehab class at cancer center on Thrusdays?)  Tell pt about ABC class     PT Home Exercise Plan  supine dowel exercises, neural stretch    Consulted and Agree with Plan of Care  Patient       Patient will benefit from skilled therapeutic intervention in order to improve the following deficits and impairments:  Decreased scar mobility, Decreased knowledge of precautions, Decreased knowledge of use of DME, Increased fascial restricitons, Pain, Increased edema, Postural dysfunction  Visit  Diagnosis: Postmastectomy lymphedema  Abnormal posture     Problem List Patient Active Problem List   Diagnosis Date Noted  . Hot flashes 07/10/2015  . Genetic testing 02/01/2015  . Orthostatic hypotension 01/27/2015  . Dehydration 10/13/2014  . Central line complication 78/58/8502  .  Diarrhea 10/13/2014  . Family history of breast cancer   . Breast cancer of upper-outer quadrant of left female breast ([place]) 03/13/2012  . History of breast cancer in female 09/24/2011    Allyson Sabal [facility] 01/05/2018, 8:52 AM  [place], [place], [address] Phone: [phone]   Fax:  [phone]  Name: LORINA DUFFNER MRN: [id number] Date of Birth: Dec 26, 1970  Manus Gunning, PT 01/05/18 8:52 AM

## 2018-01-05 NOTE — Patient Instructions (Signed)
Shoulder: Flexion (Supine)    With hands shoulder width apart, slowly lower dowel to floor behind head. Do not let elbows bend. Keep back flat. Hold _10-15___ seconds. Repeat _10___ times. Do _2___ sessions per day. CAUTION: Stretch slowly and gently.  Copyright  VHI. All rights reserved.  Shoulder: Abduction (Supine)    With right arm flat on floor, hold dowel in palm. Slowly move arm up to side of head by pushing with opposite arm. Do not let elbow bend. Hold _10-15___ seconds. Repeat _10___ times. Do _2___ sessions per day. CAUTION: Stretch slowly and gently.  Copyright  VHI. All rights reserved.   

## 2018-01-07 ENCOUNTER — Ambulatory Visit: Payer: Managed Care, Other (non HMO) | Admitting: Physical Therapy

## 2018-01-07 ENCOUNTER — Encounter: Payer: Self-pay | Admitting: Physical Therapy

## 2018-01-07 DIAGNOSIS — I972 Postmastectomy lymphedema syndrome: Secondary | ICD-10-CM | POA: Diagnosis not present

## 2018-01-07 DIAGNOSIS — M6281 Muscle weakness (generalized): Secondary | ICD-10-CM

## 2018-01-07 DIAGNOSIS — R293 Abnormal posture: Secondary | ICD-10-CM

## 2018-01-07 NOTE — Therapy (Signed)
[place], [place], [address] Phone: [phone]   Fax:  [phone]  Physical Therapy Treatment  Patient Details  Name: Aimee Poole MRN: [id number] Date of Birth: [birth year] Referring Provider: Dr. Lindi Adie    Encounter Date: 01/07/2018  PT End of Session - 01/07/18 1301    Visit Number  3    Number of Visits  13    Date for PT Re-Evaluation  02/06/18    PT Start Time  0845    PT Stop Time  0930    PT Time Calculation (min)  45 min    Activity Tolerance  Patient tolerated treatment well    Behavior During Therapy  [facility] for tasks assessed/performed       Past Medical History:  Diagnosis Date  . Breast cancer ([place]) 2010   left breast  . Breast cancer ([place]) 2015   recurrent left breast  . Cough 02/02/2015   productive of yellow mucus  . History of chemotherapy    finished chemo 01/17/2015  . Personal history of chemotherapy 2015  . Personal history of radiation therapy 2015  . Recurrent cancer of left breast ([place]) 01/2015    Past Surgical History:  Procedure Laterality Date  . BREAST LUMPECTOMY Left 2016  . BREAST LUMPECTOMY WITH RADIOACTIVE SEED LOCALIZATION Left 02/09/2015   Procedure: LEFT BREAST LUMPECTOMY WITH RADIOACTIVE SEED LOCALIZATION;  Surgeon: Rolm Bookbinder, MD;  Location: [facility];  Service: General;  Laterality: Left;  . BREAST REDUCTION SURGERY Right 05/30/2010  . CESAREAN SECTION  2008   x 1  . CYSTECTOMY     ovary as well as scar tissue  . ECTOPIC PREGNANCY SURGERY Left 04/18/2006   partial exc. distal tube  . INCISIONAL HERNIA REPAIR  01/23/2010  . MASTECTOMY Left 11/03/2009   TRAM 2011  . PORTACATH PLACEMENT    . RECONSTRUCTION BREAST W/ TRAM FLAP Left 01/23/2010  . REDUCTION MAMMAPLASTY Right   . REVISION RECONSTRUCTED BREAST Left 05/30/2010  . UTERINE FIBROID SURGERY    . VASCULAR DELAY PRE-TRAM Left 11/03/2009    There were no vitals filed for this  visit.  Subjective Assessment - 01/07/18 0904    Subjective  Pt states she thinks her hand is a little better,  Her arm is not huring its is just uncomfortable in the back of her hand.  she was only able to wear the bandages a few hours because she neede to  wash dishes. She needs more practice with wrapping her hand     Pertinent History  left breast cancer twice on the left side and had a mastectomy in 2010 with 3 nodes removed and had a wound infection.  After that She had a TRAM flap on the left and had a right side breast reduction.  She had a recurrence of left breast cancer in 2015 closer to the chest with surgery, chemo and radiation She is currently being evaluated for possibly ovarian mass and will return to GYN doctor in 6 weeks     Patient Stated Goals  to get the swelling gone down and to get her wedding ring back on     Currently in Pain?  Yes    Pain Score  3     Pain Location  Hand    Pain Orientation  Left    Pain Descriptors / Indicators  Discomfort    Pain Type  Chronic pain  LYMPHEDEMA/ONCOLOGY QUESTIONNAIRE - 01/07/18 0851      Left Upper Extremity Lymphedema   10 cm Proximal to Ulnar Styloid Process  22 cm    Just Proximal to Ulnar Styloid Process  16 cm    Across Hand at [employer]  19 cm    At [place] of 2nd Digit  6.7 cm               [facility] Adult PT Treatment/Exercise - 01/07/18 0001      Self-Care   Self-Care  Other Self-Care Comments      Shoulder Exercises: Supine   Flexion  AAROM;Both;5 reps with dowel with 15 sec hold    ABduction  AAROM;Left;5 reps with dowel with 10 sec holds      Manual Therapy   Manual Therapy  Edema management;Manual Lymphatic Drainage (MLD);Compression Bandaging;Neural Stretch    Edema Management  discussed with pt need to measure hand immediately after bandages are removed  She will take a picture and then measure ( tapes given to pt ) so we can see the effect of bandaging and make a plan for her to get  measured for glove     Manual Lymphatic Drainage (MLD)  short neck, 5 diaphragmatic breaths, right axillary and left inguinal nodes, establishment of interaxillary and axillo inguinal pathways, LUE working proximal to distal with focus on fingers then retracing all steps    Compression Bandaging  instructed pt in the following and therapist demonstrated while pt return demonstrated and applied compression bandages herself: thick stockinette from hand to wrist, elastomull to all 5 fingers, artiflex from hand to wrist, 1 6 cm bandage to hand             PT Education - 01/07/18 1300    Education provided  Yes    Education Details  self bandaging     Person(s) Educated  Patient    Methods  Explanation;Demonstration;Tactile cues;Handout;Other (comment) hand over hand instruction     Comprehension  Need further instruction          PT Long Term Goals - 12/31/17 1007      PT LONG TERM GOAL #1   Title  Pt will have reduction of circumference of left index finger to 6.5 cm     Baseline  7.0 cm     Time  4    Period  Weeks    Status  New      PT LONG TERM GOAL #2   Title  Pt will be able to self manage left arm lymphedema with self manual lymph drainage, self bandaging , exercise and use of compression garments     Time  4    Period  Weeks    Status  New      PT LONG TERM GOAL #3   Title  Pt will be independent in a home exercise program     Time  4    Period  Weeks    Status  New            Plan - 01/07/18 1256    Clinical Impression Statement  Pt seems to have had some intial reduction, but she did not wear the bandages for long. She will be try to take a picture of her hand and measure it when she takes her bandages off this time. She was able to start wrapping her fingers today but wil need more instruction. Script sent to Dr. Lindi Adie for sleeve and glove.  I called [facility] and they feel pt should have some insurance coverage     Clinical Impairments Affecting  Rehab Potential  previous radiation , multipe surgeries and 3 lymph node removal,     PT Frequency  3x / week    PT Duration  4 weeks    PT Next Visit Plan  check to see if script has come back, check to see if pt has taken picture or measurement of hand after bandages are removed and decide when pt will be ready for garments.  continue with teaching self MLD and wrapping. , Manual lymph drainage and remedial exercises and shoulder ROM HEP   Assess pt's independence with left hand bandaging. Assist with getting garmnets, Teach supine scapular series and facilitate community exercise (?? prehab class at cancer center on Thrusdays?)  Tell pt about ABC class     PT Home Exercise Plan  supine dowel exercises, neural stretch       Patient will benefit from skilled therapeutic intervention in order to improve the following deficits and impairments:  Decreased scar mobility, Decreased knowledge of precautions, Decreased knowledge of use of DME, Increased fascial restricitons, Pain, Increased edema, Postural dysfunction  Visit Diagnosis: Postmastectomy lymphedema  Abnormal posture  Muscle weakness (generalized)     Problem List Patient Active Problem List   Diagnosis Date Noted  . Hot flashes 07/10/2015  . Genetic testing 02/01/2015  . Orthostatic hypotension 01/27/2015  . Dehydration 10/13/2014  . Central line complication 66/04/3015  . Diarrhea 10/13/2014  . Family history of breast cancer   . Breast cancer of upper-outer quadrant of left female breast ([place]) 03/13/2012  . History of breast cancer in female 09/24/2011   Donato Heinz. Owens Shark PT  Norwood Levo 01/07/2018, 1:04 PM  [place], [place], [address] Phone: [phone]   Fax:  [phone]  Name: Aimee Poole MRN: [id number] Date of Birth: 04-26-1971

## 2018-01-09 ENCOUNTER — Ambulatory Visit: Payer: Managed Care, Other (non HMO) | Admitting: Physical Therapy

## 2018-01-12 ENCOUNTER — Ambulatory Visit: Payer: Managed Care, Other (non HMO) | Admitting: Physical Therapy

## 2018-01-12 DIAGNOSIS — I972 Postmastectomy lymphedema syndrome: Secondary | ICD-10-CM | POA: Diagnosis not present

## 2018-01-12 NOTE — Therapy (Signed)
[place], [place], [address] Phone: [phone]   Fax:  [phone]  Physical Therapy Treatment  Patient Details  Name: Aimee Poole MRN: [id number] Date of Birth: 25-Jan-1971 Referring Provider: Dr. Lindi Adie    Encounter Date: 01/12/2018  PT End of Session - 01/12/18 2001    Visit Number  4    Number of Visits  13    Date for PT Re-Evaluation  02/06/18    PT Start Time  5885    PT Stop Time  1429    PT Time Calculation (min)  41 min    Activity Tolerance  Patient tolerated treatment well    Behavior During Therapy  [facility] for tasks assessed/performed       Past Medical History:  Diagnosis Date  . Breast cancer ([place]) 2010   left breast  . Breast cancer ([place]) 2015   recurrent left breast  . Cough 02/02/2015   productive of yellow mucus  . History of chemotherapy    finished chemo 01/17/2015  . Personal history of chemotherapy 2015  . Personal history of radiation therapy 2015  . Recurrent cancer of left breast ([place]) 01/2015    Past Surgical History:  Procedure Laterality Date  . BREAST LUMPECTOMY Left 2016  . BREAST LUMPECTOMY WITH RADIOACTIVE SEED LOCALIZATION Left 02/09/2015   Procedure: LEFT BREAST LUMPECTOMY WITH RADIOACTIVE SEED LOCALIZATION;  Surgeon: Rolm Bookbinder, MD;  Location: [employer];  Service: General;  Laterality: Left;  . BREAST REDUCTION SURGERY Right 05/30/2010  . CESAREAN SECTION  2008   x 1  . CYSTECTOMY     ovary as well as scar tissue  . ECTOPIC PREGNANCY SURGERY Left 04/18/2006   partial exc. distal tube  . INCISIONAL HERNIA REPAIR  01/23/2010  . MASTECTOMY Left 11/03/2009   TRAM 2011  . PORTACATH PLACEMENT    . RECONSTRUCTION BREAST W/ TRAM FLAP Left 01/23/2010  . REDUCTION MAMMAPLASTY Right   . REVISION RECONSTRUCTED BREAST Left 05/30/2010  . UTERINE FIBROID SURGERY    . VASCULAR DELAY PRE-TRAM Left 11/03/2009    There were no vitals filed for this  visit.  Subjective Assessment - 01/12/18 1349    Subjective  "A big fail" with bandaging--the first time the finger bandages came off. Pt. did measure the hand but didn't take a picture of it.  "I don't know if I measured it right."    Pertinent History  left breast cancer twice on the left side and had a mastectomy in 2010 with 3 nodes removed and had a wound infection.  After that She had a TRAM flap on the left and had a right side breast reduction.  She had a recurrence of left breast cancer in 2015 closer to the chest with surgery, chemo and radiation She is currently being evaluated for possibly ovarian mass and will return to GYN doctor in 6 weeks     Currently in Pain?  No/denies            LYMPHEDEMA/ONCOLOGY QUESTIONNAIRE - 01/12/18 1352      Left Upper Extremity Lymphedema   Just Proximal to Ulnar Styloid Process  16.3 cm    Across Hand at [employer]  19.5 cm    At [place] of 2nd Digit  6.7 cm    Other  Didn't have the bandages on over the weekend.               [facility] Adult PT Treatment/Exercise -  01/12/18 0001      Manual Therapy   Edema Management  finger, hand and wrist circumference measurements taken    Manual Lymphatic Drainage (MLD)  short neck, 5 diaphragmatic breaths, right axillary and left inguinal nodes, establishment of interaxillary and axillo inguinal pathways, LUE working proximal to distal with focus on fingers then retracing all steps    Compression Bandaging  Pt. performed the following with cueing from therapist:  thick stockinette to hand and wrist, Elastomull to all five fingers, piece of Artiflex to wrist and hand, and 1-6 cm. bandage to wrist and hand.  Pt. unsure and benefitted from help with finger wraps             PT Education - 01/12/18 2000    Education provided  Yes    Education Details  self bandaging    Person(s) Educated  Patient    Methods  Explanation;Verbal cues    Comprehension  Returned demonstration;Need further  instruction          PT Long Term Goals - 12/31/17 1007      PT LONG TERM GOAL #1   Title  Pt will have reduction of circumference of left index finger to 6.5 cm     Baseline  7.0 cm     Time  4    Period  Weeks    Status  New      PT LONG TERM GOAL #2   Title  Pt will be able to self manage left arm lymphedema with self manual lymph drainage, self bandaging , exercise and use of compression garments     Time  4    Period  Weeks    Status  New      PT LONG TERM GOAL #3   Title  Pt will be independent in a home exercise program     Time  4    Period  Weeks    Status  New            Plan - 01/12/18 2002    Clinical Impression Statement  Pt. had not taken a picture of her hand when bandages were removed, but seemed to have measured accurately, based on what she showed me.  Measurements today were up a little bit, but pt. had not had bandages on all weekend.  Prescription had not been received as of treatment time, but patient was not ready to be measured for a garment today. Pt. was able to bandage herself but did need more instruction today, as she was unsure particularly about finger bandaging.  Her bandaging did look good when completed with cueing by therapist. Pt. is unable to come to Prehab exercise class on Thursdays due to overlap with childcare duties.    Rehab Potential  Good    Clinical Impairments Affecting Rehab Potential  previous radiation , multiple surgeries and 3 lymph node removal     PT Frequency  3x / week    PT Duration  4 weeks    PT Treatment/Interventions  ADLs/Self Care Home Management;Manual lymph drainage;Compression bandaging;Scar mobilization;Passive range of motion;Patient/family education;Therapeutic exercise;Therapeutic activities;Taping;Orthotic Fit/Training;Manual techniques    PT Next Visit Plan  check to see if script has come back, decide when pt will be ready for garments; continue with teaching self MLD and wrapping; manual lymph  drainage, remedial exercises and shoulder ROM HEP; Assess pt's independence with left hand bandaging. Assist with getting garmnets, Teach supine scapular series and facilitate community exercise; tell pt about  ABC class     PT Home Exercise Plan  supine dowel exercises, neural stretch    Consulted and Agree with Plan of Care  Patient       Patient will benefit from skilled therapeutic intervention in order to improve the following deficits and impairments:  Decreased scar mobility, Decreased knowledge of precautions, Decreased knowledge of use of DME, Increased fascial restricitons, Pain, Increased edema, Postural dysfunction  Visit Diagnosis: Postmastectomy lymphedema     Problem List Patient Active Problem List   Diagnosis Date Noted  . Hot flashes 07/10/2015  . Genetic testing 02/01/2015  . Orthostatic hypotension 01/27/2015  . Dehydration 10/13/2014  . Central line complication 09/81/1914  . Diarrhea 10/13/2014  . Family history of breast cancer   . Breast cancer of upper-outer quadrant of left female breast ([place]) 03/13/2012  . History of breast cancer in female 09/24/2011    SALISBURY,DONNA 01/12/2018, 8:09 PM  [place], [place], [address] Phone: [phone]   Fax:  [phone]  Name: Aimee Poole MRN: [id number] Date of Birth: [birth year]  Serafina Royals, PT 01/12/18 8:09 PM

## 2018-01-14 ENCOUNTER — Ambulatory Visit: Payer: Managed Care, Other (non HMO) | Admitting: Physical Therapy

## 2018-01-14 ENCOUNTER — Encounter: Payer: Self-pay | Admitting: Physical Therapy

## 2018-01-14 DIAGNOSIS — R293 Abnormal posture: Secondary | ICD-10-CM

## 2018-01-14 DIAGNOSIS — I972 Postmastectomy lymphedema syndrome: Secondary | ICD-10-CM | POA: Diagnosis not present

## 2018-01-14 DIAGNOSIS — M6281 Muscle weakness (generalized): Secondary | ICD-10-CM

## 2018-01-14 NOTE — Therapy (Signed)
[place], [place], [address] Phone: [phone]   Fax:  [phone]  Physical Therapy Treatment  Patient Details  Name: Aimee Poole MRN: [id number] Date of Birth: 1970-12-12 Referring Provider: Dr. Lindi Adie    Encounter Date: 01/14/2018  PT End of Session - 01/14/18 1244    Visit Number  4    Number of Visits  13    Date for PT Re-Evaluation  02/06/18    PT Start Time  0845    PT Stop Time  0941    PT Time Calculation (min)  56 min    Behavior During Therapy  [facility] for tasks assessed/performed       Past Medical History:  Diagnosis Date  . Breast cancer ([place]) 2010   left breast  . Breast cancer ([place]) 2015   recurrent left breast  . Cough 02/02/2015   productive of yellow mucus  . History of chemotherapy    finished chemo 01/17/2015  . Personal history of chemotherapy 2015  . Personal history of radiation therapy 2015  . Recurrent cancer of left breast ([place]) 01/2015    Past Surgical History:  Procedure Laterality Date  . BREAST LUMPECTOMY Left 2016  . BREAST LUMPECTOMY WITH RADIOACTIVE SEED LOCALIZATION Left 02/09/2015   Procedure: LEFT BREAST LUMPECTOMY WITH RADIOACTIVE SEED LOCALIZATION;  Surgeon: Rolm Bookbinder, MD;  Location: [facility];  Service: General;  Laterality: Left;  . BREAST REDUCTION SURGERY Right 05/30/2010  . CESAREAN SECTION  2008   x 1  . CYSTECTOMY     ovary as well as scar tissue  . ECTOPIC PREGNANCY SURGERY Left 04/18/2006   partial exc. distal tube  . INCISIONAL HERNIA REPAIR  01/23/2010  . MASTECTOMY Left 11/03/2009   TRAM 2011  . PORTACATH PLACEMENT    . RECONSTRUCTION BREAST W/ TRAM FLAP Left 01/23/2010  . REDUCTION MAMMAPLASTY Right   . REVISION RECONSTRUCTED BREAST Left 05/30/2010  . UTERINE FIBROID SURGERY    . VASCULAR DELAY PRE-TRAM Left 11/03/2009    There were no vitals filed for this visit.  Subjective Assessment - 01/14/18 0851    Subjective  I left the bandages on since Monday.  She had a "indentation" from when she took the bandages off today that has now filled in She took a picture of her hand and has been following through meausuring at home  "It was hard but I did it"     Pertinent History  left breast cancer twice on the left side and had a mastectomy in 2010 with 3 nodes removed and had a wound infection.  After that She had a TRAM flap on the left and had a right side breast reduction.  She had a recurrence of left breast cancer in 2015 closer to the chest with surgery, chemo and radiation She is currently being evaluated for possibly ovarian mass and will return to GYN doctor in 6 weeks     Patient Stated Goals  to get the swelling gone down and to get her wedding ring back on     Currently in Pain?  No/denies            LYMPHEDEMA/ONCOLOGY QUESTIONNAIRE - 01/14/18 0855      Left Upper Extremity Lymphedema   10 cm Proximal to Olecranon Process  32.5 cm    Olecranon Process  24.5 cm    15 cm Proximal to Ulnar Styloid Process  24.5 cm    10 cm Proximal to  Ulnar Styloid Process  22 cm    Just Proximal to Ulnar Styloid Process  16 cm    Across Hand at [employer]  19 cm    At [place] of 2nd Digit  6.5 cm               [facility] Adult PT Treatment/Exercise - 01/14/18 0001      Self-Care   Self-Care  Other Self-Care Comments    Other Self-Care Comments   Pt will go for measurement of glove while compression wrapping is till on hand so she can take iit off there for best measurement  Gave pt flyer for ABC class      Manual Therapy   Manual therapy comments  --    Edema Management  remeasured Lt UE.  Showed pt options for nighttime garments, especially Tribute that is recommended for her.  Also showed pt compression   upgraded script to include sewn in patch on back of hand on glove and evaluation for Tribute night to forearm     Compression Bandaging  added a piece of white foam with small pink dots to  back of hand . small tg soft. Pt performed wrapping fingers 1-5 with one elastomull Pt added another elastomull to fingers, then artiflex and 1 6 cm bandage to hand and wrist              PT Education - 01/14/18 1243    Education provided  Yes    Person(s) Educated  Patient    Methods  Explanation;Demonstration    Comprehension  Returned demonstration;Need further instruction          PT Long Term Goals - 01/14/18 1249      PT LONG TERM GOAL #1   Title  Pt will have reduction of circumference of left index finger to 6.5 cm     Baseline  7.0 cm on eval , 6.5 on 01/14/2018    Status  Achieved      PT LONG TERM GOAL #2   Title  Pt will be able to self manage left arm lymphedema with self manual lymph drainage, self bandaging , exercise and use of compression garments     Time  4    Period  Weeks    Status  On-going      PT LONG TERM GOAL #3   Title  Pt will be independent in a home exercise program     Time  4    Period  Weeks    Status  On-going            Plan - 01/14/18 1244    Clinical Impression Statement  Pt is making gains toward self management of hand edema and is following through at home.  She has persistent finger and back of hand swelling even with compression bandaging . Question if finger wraps loosen up too much as she wears them? Feel that pt is ready to be measured for compress glove immediately after wrapping is removed. Added extra compression patch to back of hand to glove script.  Recommed Tribute to forearm for night if insurace will cover.  Did not have time to teach supine scap series today     Rehab Potential  Good    Clinical Impairments Affecting Rehab Potential  previous radiation , multiple surgeries and 3 lymph node removal     PT Frequency  3x / week    PT Duration  4 weeks  PT Treatment/Interventions  ADLs/Self Care Home Management;Manual lymph drainage;Compression bandaging;Scar mobilization;Passive range of motion;Patient/family  education;Therapeutic exercise;Therapeutic activities;Taping;Orthotic Fit/Training;Manual techniques    PT Next Visit Plan  Teach Supine scapular series . continue with teaching self MLD and wrapping; manual lymph drainage, remedial exercises and shoulder ROM HEP; Assess pt's independence with left hand bandaging.     PT Home Exercise Plan  supine dowel exercises, neural stretch    Consulted and Agree with Plan of Care  Patient       Patient will benefit from skilled therapeutic intervention in order to improve the following deficits and impairments:  Decreased scar mobility, Decreased knowledge of precautions, Decreased knowledge of use of DME, Increased fascial restricitons, Pain, Increased edema, Postural dysfunction  Visit Diagnosis: Postmastectomy lymphedema  Abnormal posture  Muscle weakness (generalized)     Problem List Patient Active Problem List   Diagnosis Date Noted  . Hot flashes 07/10/2015  . Genetic testing 02/01/2015  . Orthostatic hypotension 01/27/2015  . Dehydration 10/13/2014  . Central line complication 54/00/8676  . Diarrhea 10/13/2014  . Family history of breast cancer   . Breast cancer of upper-outer quadrant of left female breast ([place]) 03/13/2012  . History of breast cancer in female 09/24/2011   Donato Heinz. Owens Shark PT  Norwood Levo 01/14/2018, 12:50 PM  [place], [place], [address] Phone: [phone]   Fax:  [phone]  Name: YASSMIN BINEGAR MRN: [id number] Date of Birth: 09-01-1971

## 2018-01-14 NOTE — Patient Instructions (Addendum)
Over Head Pull: Narrow and Wide Grip   Cancer Rehab [phone]   On back, knees bent, feet flat, band across thighs, elbows straight but relaxed. Pull hands apart (start). Keeping elbows straight, bring arms up and over head, hands toward floor. Keep pull steady on band. Hold momentarily. Return slowly, keeping pull steady, back to start. Then do same with a wider grip on the band (past shoulder width) Repeat _5-10__ times. Band color __yellow____   Side Pull: Double Arm   On back, knees bent, feet flat. Arms perpendicular to body, shoulder level, elbows straight but relaxed. Pull arms out to sides, elbows straight. Resistance band comes across collarbones, hands toward floor. Hold momentarily. Slowly return to starting position. Repeat _5-10__ times. Band color _yellow____   Sword   On back, knees bent, feet flat, left hand on left hip, right hand above left. Pull right arm DIAGONALLY (hip to shoulder) across chest. Bring right arm along head toward floor. Hold momentarily. Slowly return to starting position. Repeat _5-10__ times. Do with left arm. Band color _yellow_____   Shoulder Rotation: Double Arm   On back, knees bent, feet flat, elbows tucked at sides, bent 90, hands palms up. Pull hands apart and down toward floor, keeping elbows near sides. Hold momentarily. Slowly return to starting position. Repeat _5-10__ times. Band color __yellow____   PLEASE KEEP YOUR BANDAGES ON AS LONG AS POSSIBLE TO GET THE BEST SWELLING REDUCTION. Should your bandages become uncomfortable or feel too tight, follow these steps: 1. Elevate your extremity higher than your heart.  2. Try to move your arm or leg joints against the firmness of the bandage to help with moving the fluid and allow the bandages to loosen a bit.  3. If the bandaging is still is too tight, it is ok to carefully remove the top layer.  There will still be more layers under it that can provide compression to your  extremity. 4. Finally, if you STILL have significant pain after trying these steps, it is ok to take the bandage off.  Check your skin carefully for any signs of irritation  5. PLEASE bring ALL bandage materials back to your next appointment as we will reuse what we can TAKE CARE OF YOUR BANDAGES SO THEY WILL LAST LONGER AND STAY IN BETTER CONDITION Washing bandages:  Wash periodically using a mild detergent in warm water.  Do not use fabric softener or bleach.  Place bandages in a mesh lingerie bag or in a tied off pillow case and use the gentle cycle of the washing machine or hand wash. If you hand wash, you may want to put them in the spin cycle of your washer to get the extra water out, but make sure you put them in a mesh bag first. Do not wring or stretch them while they are wet.  Drying bandages: Lay the bandages out smoothly on a towel away from direct sunlight or heating sources that can damage the fabric. Rolling bandages in a towel and gently squeezing the towel to remove excess water before laying them out can speed up the process.  If you use a drying rack, place a towel on top of the rack to lay the bandages on.  If they hang down to dry, they fabric could be stretched out and the bandage will lose its compression.   Or, keep bandages in the mesh bag and dry them in the dryer on the low or no heat cycle. Rolling bandages: Please roll your  bandages after drying them so they are ready for your next treatment. If they are rolled too loose, they will be difficult to apply.  If rolled too tight, they can get stretched out.   TAKE CARE OF YOUR SKIN 1. Apply a low pH moisturizing lotion to your skin daily 2. Avoid scratching your skin 3. Treat skin irritations quickly  4. Know the 5 warning signs of infection: redness, pain, warmth to touch, fever and increased swelling.  Call your physician immediately if you notice any of these signs of a possible infection.

## 2018-01-16 ENCOUNTER — Ambulatory Visit: Payer: Managed Care, Other (non HMO) | Admitting: Physical Therapy

## 2018-01-19 ENCOUNTER — Ambulatory Visit: Payer: Managed Care, Other (non HMO) | Admitting: Physical Therapy

## 2018-01-19 DIAGNOSIS — I972 Postmastectomy lymphedema syndrome: Secondary | ICD-10-CM

## 2018-01-19 DIAGNOSIS — R293 Abnormal posture: Secondary | ICD-10-CM

## 2018-01-19 DIAGNOSIS — M6281 Muscle weakness (generalized): Secondary | ICD-10-CM

## 2018-01-19 NOTE — Therapy (Signed)
[place], [place], [address] Phone: [phone]   Fax:  [phone]  Physical Therapy Treatment  Patient Details  Name: Aimee Poole MRN: [id number] Date of Birth: Sep 01, 1971 Referring Provider: Dr. Lindi Adie    Encounter Date: 01/19/2018  PT End of Session - 01/19/18 1716    Visit Number  5    Number of Visits  13    Date for PT Re-Evaluation  02/06/18    PT Start Time  1307    PT Stop Time  1350    PT Time Calculation (min)  43 min    Activity Tolerance  Patient tolerated treatment well    Behavior During Therapy  [facility] for tasks assessed/performed       Past Medical History:  Diagnosis Date  . Breast cancer ([place]) 2010   left breast  . Breast cancer ([place]) 2015   recurrent left breast  . Cough 02/02/2015   productive of yellow mucus  . History of chemotherapy    finished chemo 01/17/2015  . Personal history of chemotherapy 2015  . Personal history of radiation therapy 2015  . Recurrent cancer of left breast ([place]) 01/2015    Past Surgical History:  Procedure Laterality Date  . BREAST LUMPECTOMY Left 2016  . BREAST LUMPECTOMY WITH RADIOACTIVE SEED LOCALIZATION Left 02/09/2015   Procedure: LEFT BREAST LUMPECTOMY WITH RADIOACTIVE SEED LOCALIZATION;  Surgeon: Rolm Bookbinder, MD;  Location: [place];  Service: General;  Laterality: Left;  . BREAST REDUCTION SURGERY Right 05/30/2010  . CESAREAN SECTION  2008   x 1  . CYSTECTOMY     ovary as well as scar tissue  . ECTOPIC PREGNANCY SURGERY Left 04/18/2006   partial exc. distal tube  . INCISIONAL HERNIA REPAIR  01/23/2010  . MASTECTOMY Left 11/03/2009   TRAM 2011  . PORTACATH PLACEMENT    . RECONSTRUCTION BREAST W/ TRAM FLAP Left 01/23/2010  . REDUCTION MAMMAPLASTY Right   . REVISION RECONSTRUCTED BREAST Left 05/30/2010  . UTERINE FIBROID SURGERY    . VASCULAR DELAY PRE-TRAM Left 11/03/2009    There were no vitals filed for this  visit.  Subjective Assessment - 01/19/18 1315    Subjective  I feel like the hand is bigger--the sleeve is making it get stuck in there.    Currently in Pain?  No/denies                      [facility] Adult PT Treatment/Exercise - 01/19/18 0001      Self-Care   Other Self-Care Comments   Discussed the loaner compression garments she was given, and made suggestions for permanent garments: Solaris Exo-Strong or Medi off the shelf flat knit; maybe Jobst Bella strong sleeve for their larger range of sizes to keep the wrist from being too tight. Also talked about nighttime garment; pt. thinks what she was shown at [facility] is a newer garment that just came out.      Shoulder Exercises: Supine   Other Supine Exercises  instructed in supine scapular series of exercises vs. yellow Theraband Shan Levans, PT, did instruction      Manual Therapy   Manual Lymphatic Drainage (MLD)  short neck, deep abdomen, right axillary and left inguinal nodes, establishment of interaxillary and axillo inguinal pathways, LUE working proximal to distal with focus on fingers then retracing all steps done today by Shan Levans, PT    Compression Bandaging  Pt. donned just the  glove with the foam pad for dorsal hand at end of session, to see if that would work better than with the sleeve.             PT Education - 01/19/18 1333    Education provided  Yes    Education Details  supine scapular series exercises vs. yellow Theraband    Person(s) Educated  Patient    Methods  Explanation;Handout;Verbal cues    Comprehension  Verbalized understanding;Returned demonstration          PT Long Term Goals - 01/14/18 1249      PT LONG TERM GOAL #1   Title  Pt will have reduction of circumference of left index finger to 6.5 cm     Baseline  7.0 cm on eval , 6.5 on 01/14/2018    Status  Achieved      PT LONG TERM GOAL #2   Title  Pt will be able to self manage left arm lymphedema with self manual  lymph drainage, self bandaging , exercise and use of compression garments     Time  4    Period  Weeks    Status  On-going      PT LONG TERM GOAL #3   Title  Pt will be independent in a home exercise program     Time  4    Period  Weeks    Status  On-going            Plan - 01/19/18 1716    Clinical Impression Statement  Pt. had measurements taken at [facility] for day- and nighttime garments, but orders have not been placed yet, it seems.  The loaner garments she was given to use (Lymphedivas arm sleeve and glove) have not worked well for her.  She feels like these caused swelling at dorsal hand to be increased.  Imprints from the foam pieces at dorsal hand were clear, so there was compression, but it seemed the wrist dimension of the sleeve was too narrow.    Rehab Potential  Good    Clinical Impairments Affecting Rehab Potential  previous radiation , multiple surgeries and 3 lymph node removal     PT Frequency  3x / week    PT Duration  4 weeks    PT Treatment/Interventions  ADLs/Self Care Home Management;Manual lymph drainage;Compression bandaging;Scar mobilization;Passive range of motion;Patient/family education;Therapeutic exercise;Therapeutic activities;Taping;Orthotic Fit/Training;Manual techniques    PT Next Visit Plan  continue with teaching self MLD and wrapping; manual lymph drainage, remedial exercises and shoulder ROM HEP; Assess pt's independence with left hand bandaging.     PT Home Exercise Plan  supine dowel exercises, neural stretch, supine scapular series    Consulted and Agree with Plan of Care  Patient       Patient will benefit from skilled therapeutic intervention in order to improve the following deficits and impairments:  Decreased scar mobility, Decreased knowledge of precautions, Decreased knowledge of use of DME, Increased fascial restricitons, Pain, Increased edema, Postural dysfunction  Visit Diagnosis: Postmastectomy lymphedema  Abnormal  posture  Muscle weakness (generalized)     Problem List Patient Active Problem List   Diagnosis Date Noted  . Hot flashes 07/10/2015  . Genetic testing 02/01/2015  . Orthostatic hypotension 01/27/2015  . Dehydration 10/13/2014  . 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 Diarrhea 10/13/2014  . Family history of breast cancer   . Breast cancer of upper-outer quadrant of left female breast ([place]) 03/13/2012  . History  of breast cancer in female 09/24/2011    [facility] 01/19/2018, 5:26 PM  [place], [place], [address] Phone: [phone]   Fax:  [phone]  Name: Aimee Poole MRN: [id number] Date of Birth: June 17, 1971  Serafina Royals, PT 01/19/18 5:26 PM

## 2018-01-21 ENCOUNTER — Encounter: Payer: Self-pay | Admitting: Physical Therapy

## 2018-01-21 ENCOUNTER — Ambulatory Visit: Payer: Managed Care, Other (non HMO) | Admitting: Physical Therapy

## 2018-01-21 DIAGNOSIS — M6281 Muscle weakness (generalized): Secondary | ICD-10-CM

## 2018-01-21 DIAGNOSIS — I972 Postmastectomy lymphedema syndrome: Secondary | ICD-10-CM | POA: Diagnosis not present

## 2018-01-21 DIAGNOSIS — R293 Abnormal posture: Secondary | ICD-10-CM

## 2018-01-21 NOTE — Therapy (Signed)
[place], [place], [address] Phone: [phone]   Fax:  [phone]  Physical Therapy Treatment  Patient Details  Name: Aimee Poole MRN: [id number] Date of Birth: 06-May-1971 Referring Provider: Dr. Lindi Adie    Encounter Date: 01/21/2018  PT End of Session - 01/21/18 0946    Visit Number  6    Number of Visits  13    Date for PT Re-Evaluation  02/06/18    PT Start Time  0848    PT Stop Time  0930    PT Time Calculation (min)  42 min    Activity Tolerance  Patient tolerated treatment well    Behavior During Therapy  [facility] for tasks assessed/performed       Past Medical History:  Diagnosis Date  . Breast cancer ([place]) 2010   left breast  . Breast cancer ([place]) 2015   recurrent left breast  . Cough 02/02/2015   productive of yellow mucus  . History of chemotherapy    finished chemo 01/17/2015  . Personal history of chemotherapy 2015  . Personal history of radiation therapy 2015  . Recurrent cancer of left breast ([place]) 01/2015    Past Surgical History:  Procedure Laterality Date  . BREAST LUMPECTOMY Left 2016  . BREAST LUMPECTOMY WITH RADIOACTIVE SEED LOCALIZATION Left 02/09/2015   Procedure: LEFT BREAST LUMPECTOMY WITH RADIOACTIVE SEED LOCALIZATION;  Surgeon: Rolm Bookbinder, MD;  Location: [facility];  Service: General;  Laterality: Left;  . BREAST REDUCTION SURGERY Right 05/30/2010  . CESAREAN SECTION  2008   x 1  . CYSTECTOMY     ovary as well as scar tissue  . ECTOPIC PREGNANCY SURGERY Left 04/18/2006   partial exc. distal tube  . INCISIONAL HERNIA REPAIR  01/23/2010  . MASTECTOMY Left 11/03/2009   TRAM 2011  . PORTACATH PLACEMENT    . RECONSTRUCTION BREAST W/ TRAM FLAP Left 01/23/2010  . REDUCTION MAMMAPLASTY Right   . REVISION RECONSTRUCTED BREAST Left 05/30/2010  . UTERINE FIBROID SURGERY    . VASCULAR DELAY PRE-TRAM Left 11/03/2009    There were no vitals filed for this  visit.  Subjective Assessment - 01/21/18 0857    Subjective  Pt feels like she could wrap her hand if she needed to but she doesn't  always do it.  She didn't wear anything on her arm last night. She comes in with loaner compression glove with patch on back of hand     Pertinent History  left breast cancer twice on the left side and had a mastectomy in 2010 with 3 nodes removed and had a wound infection.  After that She had a TRAM flap on the left and had a right side breast reduction.  She had a recurrence of left breast cancer in 2015 closer to the chest with surgery, chemo and radiation She is currently being evaluated for possibly ovarian mass and will return to GYN doctor in 6 weeks     Patient Stated Goals  to get the swelling gone down and to get her wedding ring back on     Currently in Pain?  No/denies            LYMPHEDEMA/ONCOLOGY QUESTIONNAIRE - 01/21/18 0859      Left Upper Extremity Lymphedema   10 cm Proximal to Ulnar Styloid Process  22 cm    Just Proximal to Ulnar Styloid Process  15.8 cm    Across Hand at [employer]  18.8 cm    At [facility] of 2nd Digit  6.5 cm               [facility] Adult PT Treatment/Exercise - 01/21/18 0001      Self-Care   Other Self-Care Comments   Continued discussion of compression garments and feel that custom glove and nighttime tribute to hand is necessary.  Not sure if she needs compression sleeve or nighttime to arm . encouraged pt to do general fitness exercise       Manual Therapy   Edema Management  encouraged pt keep hand elevated and exercise with MLD to fingers and hand whenever she can     Manual Lymphatic Drainage (MLD)  short neck, 5 diaphragmatic breaths, right axillary and left inguinal nodes, establishment of interaxillary and axillo inguinal pathways, LUE working proximal to distal with focus on fingers then retracing all steps extra time spent on hand and fingers in elevation.  Reinstructed pt in technique for fingers  cueing her not to slide on skin and to go slower     Compression Bandaging  added piece of 1/4' foam around thumb and one elasatomull to fingerw 1-4 foam pad to back of hand and lymphedema glove over all.                   PT Long Term Goals - 01/14/18 1249      PT LONG TERM GOAL #1   Title  Pt will have reduction of circumference of left index finger to 6.5 cm     Baseline  7.0 cm on eval , 6.5 on 01/14/2018    Status  Achieved      PT LONG TERM GOAL #2   Title  Pt will be able to self manage left arm lymphedema with self manual lymph drainage, self bandaging , exercise and use of compression garments     Time  4    Period  Weeks    Status  On-going      PT LONG TERM GOAL #3   Title  Pt will be independent in a home exercise program     Time  4    Period  Weeks    Status  On-going            Plan - 01/21/18 0947    Clinical Impression Statement  Pt has been only using intemittent compression on her hand and overall, it looks like it is making gradual improvment.  She noticed that a ring she had been wearing is looser. She still has visible edema in proximal phalange of fingers and in thenar area of hand.  Upgraded compression to these areas under glove today .     Clinical Impairments Affecting Rehab Potential  previous radiation , multiple surgeries and 3 lymph node removal     PT Frequency  3x / week    PT Duration  4 weeks    PT Treatment/Interventions  ADLs/Self Care Home Management;Manual lymph drainage;Compression bandaging;Scar mobilization;Passive range of motion;Patient/family education;Therapeutic exercise;Therapeutic activities;Taping;Orthotic Fit/Training;Manual techniques    PT Next Visit Plan  continue assessment of left hand and with teaching self MLD and wrapping; manual lymph drainage, remedial exercises and shoulder ROM HEP; Assess pt's independence with left hand bandaging.        Patient will benefit from skilled therapeutic intervention in  order to improve the following deficits and impairments:  Decreased scar mobility, Decreased knowledge of precautions, Decreased knowledge of use of DME, Increased fascial restricitons,  Pain, Increased edema, Postural dysfunction  Visit Diagnosis: Abnormal posture  Muscle weakness (generalized)  Postmastectomy lymphedema     Problem List Patient Active Problem List   Diagnosis Date Noted  . Hot flashes 07/10/2015  . Genetic testing 02/01/2015  . Orthostatic hypotension 01/27/2015  . Dehydration 10/13/2014  . Central line complication 25/75/0518  . Diarrhea 10/13/2014  . Family history of breast cancer   . Breast cancer of upper-outer quadrant of left female breast ([place]) 03/13/2012  . History of breast cancer in female 09/24/2011   Donato Heinz. Owens Shark PT  [place] Levo 01/21/2018, 9:50 AM  [place], [place], [address] Phone: [phone]   Fax:  [phone]  Name: Aimee Poole MRN: [id number] Date of Birth: 07/19/1971

## 2018-01-23 ENCOUNTER — Ambulatory Visit: Payer: Managed Care, Other (non HMO) | Attending: Hematology and Oncology | Admitting: Physical Therapy

## 2018-01-23 ENCOUNTER — Encounter: Payer: Self-pay | Admitting: Physical Therapy

## 2018-01-23 DIAGNOSIS — M6281 Muscle weakness (generalized): Secondary | ICD-10-CM | POA: Insufficient documentation

## 2018-01-23 DIAGNOSIS — R293 Abnormal posture: Secondary | ICD-10-CM | POA: Diagnosis present

## 2018-01-23 DIAGNOSIS — I972 Postmastectomy lymphedema syndrome: Secondary | ICD-10-CM | POA: Insufficient documentation

## 2018-01-23 NOTE — Therapy (Signed)
[place], [place], [address] Phone: [phone]   Fax:  [phone]  Physical Therapy Treatment  Patient Details  Name: Aimee Poole MRN: [id number] Date of Birth: 1971-01-01 Referring Provider: Dr. Lindi Adie    Encounter Date: 01/23/2018  PT End of Session - 01/23/18 1236    Visit Number  7    Number of Visits  13    Date for PT Re-Evaluation  02/06/18    PT Start Time  3474 pt arrived late     PT Stop Time  0925    PT Time Calculation (min)  30 min    Activity Tolerance  Patient tolerated treatment well    Behavior During Therapy  [facility] for tasks assessed/performed       Past Medical History:  Diagnosis Date  . Breast cancer ([place]) 2010   left breast  . Breast cancer ([place]) 2015   recurrent left breast  . Cough 02/02/2015   productive of yellow mucus  . History of chemotherapy    finished chemo 01/17/2015  . Personal history of chemotherapy 2015  . Personal history of radiation therapy 2015  . Recurrent cancer of left breast ([place]) 01/2015    Past Surgical History:  Procedure Laterality Date  . BREAST LUMPECTOMY Left 2016  . BREAST LUMPECTOMY WITH RADIOACTIVE SEED LOCALIZATION Left 02/09/2015   Procedure: LEFT BREAST LUMPECTOMY WITH RADIOACTIVE SEED LOCALIZATION;  Surgeon: Rolm Bookbinder, MD;  Location: [facility];  Service: General;  Laterality: Left;  . BREAST REDUCTION SURGERY Right 05/30/2010  . CESAREAN SECTION  2008   x 1  . CYSTECTOMY     ovary as well as scar tissue  . ECTOPIC PREGNANCY SURGERY Left 04/18/2006   partial exc. distal tube  . INCISIONAL HERNIA REPAIR  01/23/2010  . MASTECTOMY Left 11/03/2009   TRAM 2011  . PORTACATH PLACEMENT    . RECONSTRUCTION BREAST W/ TRAM FLAP Left 01/23/2010  . REDUCTION MAMMAPLASTY Right   . REVISION RECONSTRUCTED BREAST Left 05/30/2010  . UTERINE FIBROID SURGERY    . VASCULAR DELAY PRE-TRAM Left 11/03/2009    There were no vitals  filed for this visit.  Subjective Assessment - 01/23/18 1222    Subjective  Pt will go later today to get her hand measured for custom glove and tribute night for hand     Pertinent History  left breast cancer twice on the left side and had a mastectomy in 2010 with 3 nodes removed and had a wound infection.  After that She had a TRAM flap on the left and had a right side breast reduction.  She had a recurrence of left breast cancer in 2015 closer to the chest with surgery, chemo and radiation She is currently being evaluated for possibly ovarian mass and will return to GYN doctor in 6 weeks     Patient Stated Goals  to get the swelling gone down and to get her wedding ring back on     Currently in Pain?  No/denies                      [facility] Adult PT Treatment/Exercise - 01/23/18 0001      Manual Therapy   Manual Lymphatic Drainage (MLD)  short neck, deep abdomen, right axillary and left inguinal nodes, establishment of interaxillary and axillo inguinal pathways, LUE working proximal to distal with focus on fingers then retracing all steps    Compression Bandaging  1/4  foam piece to thumb and elastomull to fingers 1-5 with pt doing 75% of wrapping with therapist supervising and reinforcing wrap to hand . Pt will put her glove over wrap when she gets to the car              PT Education - 01/23/18 1235    Education provided  Yes    Education Details  reviwed finger wrapping and encouraged pt to slow down    Person(s) Educated  Patient    Methods  Tactile cues;Verbal cues    Comprehension  Returned demonstration;Tactile cues required;Verbal cues required;Need further instruction pt shows improvement           PT Long Term Goals - 01/14/18 1249      PT LONG TERM GOAL #1   Title  Pt will have reduction of circumference of left index finger to 6.5 cm     Baseline  7.0 cm on eval , 6.5 on 01/14/2018    Status  Achieved      PT LONG TERM GOAL #2   Title  Pt will be  able to self manage left arm lymphedema with self manual lymph drainage, self bandaging , exercise and use of compression garments     Time  4    Period  Weeks    Status  On-going      PT LONG TERM GOAL #3   Title  Pt will be independent in a home exercise program     Time  4    Period  Weeks    Status  On-going            Plan - 01/23/18 1237    Clinical Impression Statement  Pt shows improvement especially around thumb, but still has swelling in her hand   She shoulde be ok with custom glove and nighttime hand tribute and will get those ordered today     Rehab Potential  Good    Clinical Impairments Affecting Rehab Potential  previous radiation , multiple surgeries and 3 lymph node removal     PT Frequency  3x / week    PT Duration  4 weeks    PT Next Visit Plan  continue assessment of left hand and with teaching self MLD and wrapping; manual lymph drainage, remedial exercises and shoulder ROM HEP; Assess pt's independence with left hand bandaging consider decreasing frequency once pt gets garments measured as she should be able to     Consulted and Agree with Plan of Care  Patient       Patient will benefit from skilled therapeutic intervention in order to improve the following deficits and impairments:  Decreased scar mobility, Decreased knowledge of precautions, Decreased knowledge of use of DME, Increased fascial restricitons, Pain, Increased edema, Postural dysfunction  Visit Diagnosis: Abnormal posture  Muscle weakness (generalized)  Postmastectomy lymphedema     Problem List Patient Active Problem List   Diagnosis Date Noted  . Hot flashes 07/10/2015  . Genetic testing 02/01/2015  . Orthostatic hypotension 01/27/2015  . Dehydration 10/13/2014  . Central line complication 16/08/9603  . Diarrhea 10/13/2014  . Family history of breast cancer   . Breast cancer of upper-outer quadrant of left female breast ([place]) 03/13/2012  . History of breast cancer in female  09/24/2011   Donato Heinz. Owens Shark PT  [place] Levo 01/23/2018, 12:53 PM  [place], [place], [address] Phone: [phone]   Fax:  [phone]  Name: Aimee Poole MRN: [id number] Date of Birth: 05/01/1971

## 2018-01-26 ENCOUNTER — Ambulatory Visit: Payer: Managed Care, Other (non HMO)

## 2018-01-28 ENCOUNTER — Ambulatory Visit: Payer: Managed Care, Other (non HMO)

## 2018-01-28 VITALS — BP 122/72

## 2018-01-28 DIAGNOSIS — I972 Postmastectomy lymphedema syndrome: Secondary | ICD-10-CM

## 2018-01-28 DIAGNOSIS — R293 Abnormal posture: Secondary | ICD-10-CM | POA: Diagnosis not present

## 2018-01-28 NOTE — Therapy (Addendum)
[place], [place], [address] Phone: [phone]   Fax:  [phone]  Physical Therapy Treatment  Patient Details  Name: Aimee Poole MRN: [id number] Date of Birth: Mar 11, 1971 Referring Provider: Dr. Lindi Adie    Encounter Date: 01/28/2018  PT End of Session - 01/28/18 0932    Visit Number  8    Number of Visits  13    Date for PT Re-Evaluation  02/06/18    PT Start Time  0851    PT Stop Time  0931    PT Time Calculation (min)  40 min    Activity Tolerance  Patient tolerated treatment well    Behavior During Therapy  [facility] for tasks assessed/performed       Past Medical History:  Diagnosis Date  . Breast cancer ([place]) 2010   left breast  . Breast cancer ([place]) 2015   recurrent left breast  . Cough 02/02/2015   productive of yellow mucus  . History of chemotherapy    finished chemo 01/17/2015  . Personal history of chemotherapy 2015  . Personal history of radiation therapy 2015  . Recurrent cancer of left breast ([place]) 01/2015    Past Surgical History:  Procedure Laterality Date  . BREAST LUMPECTOMY Left 2016  . BREAST LUMPECTOMY WITH RADIOACTIVE SEED LOCALIZATION Left 02/09/2015   Procedure: LEFT BREAST LUMPECTOMY WITH RADIOACTIVE SEED LOCALIZATION;  Surgeon: Rolm Bookbinder, MD;  Location: [facility];  Service: General;  Laterality: Left;  . BREAST REDUCTION SURGERY Right 05/30/2010  . CESAREAN SECTION  2008   x 1  . CYSTECTOMY     ovary as well as scar tissue  . ECTOPIC PREGNANCY SURGERY Left 04/18/2006   partial exc. distal tube  . INCISIONAL HERNIA REPAIR  01/23/2010  . MASTECTOMY Left 11/03/2009   TRAM 2011  . PORTACATH PLACEMENT    . RECONSTRUCTION BREAST W/ TRAM FLAP Left 01/23/2010  . REDUCTION MAMMAPLASTY Right   . REVISION RECONSTRUCTED BREAST Left 05/30/2010  . UTERINE FIBROID SURGERY    . VASCULAR DELAY PRE-TRAM Left 11/03/2009    Vitals:   01/28/18 0932  BP: 122/72     Subjective Assessment - 01/28/18 0851    Subjective  I got [employer] nighttime garment orderd, just the hand piece and a flat knit glove. Overall I think the swelling is doing okay, just the base of my fingers is the worse. I've felt lightheaded the past 3 days. I haven't been getting enough rest so that might be it.     Pertinent History  left breast cancer twice on the left side and had a mastectomy in 2010 with 3 nodes removed and had a wound infection.  After that She had a TRAM flap on the left and had a right side breast reduction.  She had a recurrence of left breast cancer in 2015 closer to the chest with surgery, chemo and radiation She is currently being evaluated for possibly ovarian mass and will return to GYN doctor in 6 weeks     Patient Stated Goals  to get the swelling gone down and to get her wedding ring back on     Currently in Pain?  No/denies                      [facility] Adult PT Treatment/Exercise - 01/28/18 0001      Manual Therapy   Manual Lymphatic Drainage (MLD)  short neck, deep abdomen, right axillary and  left inguinal nodes, establishment of interaxillary and axillo inguinal pathways, LUE working proximal to distal with focus on fingers then retracing all steps    Compression Bandaging  1/4 foam piece to thumb and elastomull to fingers 1-5 with pt doing 75% of wrapping with therapist supervising and reinforcing wrap to hand . Pt will put her glove over wrap when she gets to the car                   PT Long Term Goals - 01/28/18 0929      PT LONG TERM GOAL #1   Title  Pt will have reduction of circumference of left index finger to 6.5 cm     Baseline  7.0 cm on eval , 6.5 on 01/14/2018    Status  Achieved      PT LONG TERM GOAL #2   Title  Pt will be able to self manage left arm lymphedema with self manual lymph drainage, self bandaging , exercise and use of compression garments     Baseline  Pt reports independence with self MLD and  bandaging, awaiting arrival of compression garments for hand-01/28/18    Status  Partially Met      PT LONG TERM GOAL #3   Title  Pt will be independent in a home exercise program     Status  Achieved            Plan - 01/28/18 0932    Clinical Impression Statement  Pts swelling visibly looked very minimal today and pt reported only really noticing it around base of fingers. She has been wrapping her fingers and wearing old compression glove and doing self manual lymph drainage at home. She was measured for day and nighttime hand pieces Monday so she would like to be on hold until those arrive and make final appointment then for assessment of compression garments.     Rehab Potential  Good    Clinical Impairments Affecting Rehab Potential  previous radiation , multiple surgeries and 3 lymph node removal     PT Frequency  3x / week    PT Duration  4 weeks    PT Treatment/Interventions  ADLs/Self Care Home Management;Manual lymph drainage;Compression bandaging;Scar mobilization;Passive range of motion;Patient/family education;Therapeutic exercise;Therapeutic activities;Taping;Orthotic Fit/Training;Manual techniques    PT Next Visit Plan  Pt on hold until her compresion garments arrive and then she plans to make an appointment for final assess and circumference measurements.     Consulted and Agree with Plan of Care  Patient       Patient will benefit from skilled therapeutic intervention in order to improve the following deficits and impairments:  Decreased scar mobility, Decreased knowledge of precautions, Decreased knowledge of use of DME, Increased fascial restricitons, Pain, Increased edema, Postural dysfunction  Visit Diagnosis: Postmastectomy lymphedema     Problem List Patient Active Problem List   Diagnosis Date Noted  . Hot flashes 07/10/2015  . Genetic testing 02/01/2015  . Orthostatic hypotension 01/27/2015  . Dehydration 10/13/2014  . Central line complication  23/34/3568  . Diarrhea 10/13/2014  . Family history of breast cancer   . Breast cancer of upper-outer quadrant of left female breast ([place]) 03/13/2012  . History of breast cancer in female 09/24/2011    Otelia Limes, PTA 01/28/2018, 9:36 AM  [place], [place], [address] Phone: [phone]   Fax:  [phone]  Name: Aimee Poole MRN: [id number] Date of  Birth: [birth year] PHYSICAL THERAPY DISCHARGE SUMMARY  Visits from Start of Care: 8  Current functional level related to goals / functional outcomes: unknown   Remaining deficits: unknown   Education / Equipment: Lymphedema management  Plan: Patient agrees to discharge.  Patient goals were partially met. Patient is being discharged due to not returning since the last visit.  ?????    Maudry Diego, PT 07/01/18 11:24 AM

## 2018-03-20 ENCOUNTER — Ambulatory Visit: Payer: BLUE CROSS/BLUE SHIELD | Admitting: Hematology and Oncology

## 2018-04-03 ENCOUNTER — Inpatient Hospital Stay: Payer: Managed Care, Other (non HMO) | Admitting: Hematology and Oncology

## 2018-04-21 ENCOUNTER — Other Ambulatory Visit: Payer: Self-pay | Admitting: Hematology and Oncology

## 2018-04-22 ENCOUNTER — Other Ambulatory Visit: Payer: Self-pay

## 2018-04-22 DIAGNOSIS — C50412 Malignant neoplasm of upper-outer quadrant of left female breast: Secondary | ICD-10-CM

## 2018-04-22 MED ORDER — TAMOXIFEN CITRATE 20 MG PO TABS: ORAL_TABLET | ORAL | 3 refills | Status: DC

## 2018-04-28 ENCOUNTER — Telehealth: Payer: Self-pay | Admitting: Hematology and Oncology

## 2018-04-28 NOTE — Telephone Encounter (Signed)
Returned call to patient requesting appointment to be rescheduled from 5/10 per 6/3 phone call

## 2018-04-29 ENCOUNTER — Inpatient Hospital Stay: Payer: Managed Care, Other (non HMO) | Attending: Hematology and Oncology | Admitting: Hematology and Oncology

## 2018-04-29 DIAGNOSIS — Z79810 Long term (current) use of selective estrogen receptor modulators (SERMs): Secondary | ICD-10-CM | POA: Insufficient documentation

## 2018-04-29 DIAGNOSIS — Z170 Estrogen receptor positive status [ER+]: Secondary | ICD-10-CM

## 2018-04-29 DIAGNOSIS — C50412 Malignant neoplasm of upper-outer quadrant of left female breast: Secondary | ICD-10-CM | POA: Insufficient documentation

## 2018-04-29 DIAGNOSIS — N83209 Unspecified ovarian cyst, unspecified side: Secondary | ICD-10-CM | POA: Insufficient documentation

## 2018-04-29 NOTE — Assessment & Plan Note (Signed)
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 clinical stage ER 7% PR 0% HER-2 positive ratio 3.84 in the reconstructed left breast. Completed 6 cycles of of TCHP, currently on Herceptin maintenance that will complete November 2016  Post neoadjuvant MRI: Decrease in size of the mass from 4 cm to 2.8 cm; Final pathology counseling: pathologic complete response based on lumpectomy March 2016  Completed radiation thearpy in June 2016 Herceptin maintenance completed Nov 7th 2016 (ECHO 9/16: EF 55-60%) Started tamoxifen June 2016  Current treatment: Tamoxifen daily started June 2016  Tamoxifen Toxicities:  Intermittent hot flashes. Denies any arthralgias or myalgias  Breast cancer surveillance:  Mammogram 09/20/2016: No mammographic evidence of malignancy, breast density category B Breast exam: 03/21/2017 no palpable lumps or nodules. Hemorrhagic ovarian cyst and fibroids: Patient being evaluated by GYN  RTC in 1 year

## 2018-04-29 NOTE — Progress Notes (Signed)
Patient Care Team: Kelton Pillar, MD as PCP - General (Family Medicine)  DIAGNOSIS:  Encounter Diagnosis  Name Primary?  . 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 The genes tested were ATM, BARD1, BRCA1, BRCA2, BRIP1, CDH1, CHEK2, MRE11A, MUTYH, NBN, NF1, PALB2, PTEN, RAD50, RAD51C, RAD51D, and TP53.      01/30/2015 Breast MRI    Left breast: Masses decrease in size from 4 cm to 2.8 cm abuts the pectoralis muscle      02/07/2015 -  Chemotherapy    Herceptin maintenance therapy to complete November 2016      02/09/2015 Surgery    Left breast lumpectomy: Pathologic complete response      03/13/2015 - 05/02/2015 Radiation Therapy    Adjuvant radiation therapy      05/30/2015 -  Anti-estrogen oral therapy    Tamoxifen 20 mg daily       CHIEF COMPLIANT: Follow-up on tamoxifen therapy  INTERVAL HISTORY: Aimee Poole is a 47 year old with above-mentioned history of left breast cancer treated with neoadjuvant chemotherapy followed by lumpectomy radiation is currently on tamoxifen.  She appears to be tolerating tamoxifen reasonably well.  She had one episode of heavy  menstrual cycle in April and has not had any further cycle since then.  She denies any significant hot flashes.  Does have occasional muscle cramps. She tells me that she has been stressed at home because she has to take care of for additional family members.  Both of her nieces and their little kids are living under her care at this time because of financial issues.  REVIEW OF SYSTEMS:   Constitutional: Denies fevers, chills or abnormal weight loss Eyes: Denies blurriness of vision Ears, nose, mouth, throat, and face: Denies mucositis or sore throat Respiratory: Denies cough, dyspnea or wheezes Cardiovascular: Denies palpitation, chest discomfort Gastrointestinal:  Denies nausea, heartburn or change in bowel habits Skin: Denies abnormal skin rashes Lymphatics: Denies new lymphadenopathy or easy bruising Neurological:Denies numbness, tingling or new weaknesses Behavioral/Psych: Mood is stable, no new changes  Extremities: No lower extremity edema Breast:  denies any pain or lumps or nodules in either breasts All other systems were reviewed with the patient and are negative.  I have reviewed the past medical history, past surgical history, social history and family history with the patient and they are unchanged from previous note.  ALLERGIES:  is allergic to corn-containing products.  MEDICATIONS:  Current Outpatient Medications  Medication Sig Dispense Refill  . diphenhydramine-acetaminophen (TYLENOL PM) 25-500 MG TABS Take 1 tablet by mouth at bedtime as needed (sleep).     Marland Kitchen  naproxen (NAPROSYN) 500 MG tablet Take 1 tablet (500 mg total) by mouth 2 (two) times daily with a meal. (Patient not taking: Reported on 12/31/2017) 30 tablet 0  . tamoxifen (NOLVADEX) 20 MG tablet TAKE 1 TABLET BY MOUTH  DAILY 90 tablet 3   No current facility-administered medications for this visit.     PHYSICAL EXAMINATION: ECOG PERFORMANCE STATUS: 1 - Symptomatic but completely ambulatory  Vitals:    04/29/18 1104  BP: (!) 145/82  Pulse: 75  Resp: 19  Temp: 98.6 F (37 C)  SpO2: 100%   Filed Weights   04/29/18 1104  Weight: 184 lb 3.2 oz (83.6 kg)    GENERAL:alert, no distress and comfortable SKIN: skin color, texture, turgor are normal, no rashes or significant lesions EYES: normal, Conjunctiva are pink and non-injected, sclera clear OROPHARYNX:no exudate, no erythema and lips, buccal mucosa, and tongue normal  NECK: supple, thyroid normal size, non-tender, without nodularity LYMPH:  no palpable lymphadenopathy in the cervical, axillary or inguinal LUNGS: clear to auscultation and percussion with normal breathing effort HEART: regular rate & rhythm and no murmurs and no lower extremity edema ABDOMEN:abdomen soft, non-tender and normal bowel sounds MUSCULOSKELETAL:no cyanosis of digits and no clubbing  NEURO: alert & oriented x 3 with fluent speech, no focal motor/sensory deficits EXTREMITIES: No lower extremity edema  LABORATORY DATA:  I have reviewed the data as listed CMP Latest Ref Rng & Units 02/17/2016 12/04/2015 09/11/2015  Glucose 65 - 99 mg/dL 94 111(H) 88  BUN 6 - 20 mg/dL 9 11 9.1  Creatinine 0.44 - 1.00 mg/dL 0.64 0.58 0.7  Sodium 135 - 145 mmol/L 141 139 142  Potassium 3.5 - 5.1 mmol/L 3.9 4.0 3.6  Chloride 101 - 111 mmol/L 111 108 -  CO2 22 - 32 mmol/L 22 22 26   Calcium 8.9 - 10.3 mg/dL 10.1 9.3 9.9  Total Protein 6.4 - 8.3 g/dL - - 7.1  Total Bilirubin 0.20 - 1.20 mg/dL - - <0.30  Alkaline Phos 40 - 150 U/L - - 125  AST 5 - 34 U/L - - 16  ALT 0 - 55 U/L - - 11    Lab Results  Component Value Date   WBC 5.6 02/17/2016   HGB 12.4 02/17/2016   HCT 35.3 (L) 02/17/2016   MCV 86.1 02/17/2016   PLT 250 02/17/2016   NEUTROABS 3.1 02/17/2016    ASSESSMENT & PLAN:  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 clinical stage ER 7% PR 0% HER-2 positive ratio 3.84 in the  reconstructed left breast. Completed 6 cycles of of TCHP, currently on Herceptin maintenance that will complete November 2016  Post neoadjuvant MRI: Decrease in size of the mass from 4 cm to 2.8 cm; Final pathology counseling: pathologic complete response based on lumpectomy March 2016  Completed radiation thearpy in June 2016 Herceptin maintenance completed Nov 7th 2016 (ECHO 9/16: EF 55-60%) Started tamoxifen June 2016  Current treatment: Tamoxifen daily started June 2016  Tamoxifen Toxicities:  Intermittent hot flashes. Denies any arthralgias or myalgias  Breast cancer surveillance:  Mammogram 09/20/2016: No mammographic evidence of malignancy, breast density category B Breast exam: 03/21/2017 no palpable lumps or nodules. Hemorrhagic ovarian cyst and fibroids: Patient being evaluated by GYN  RTC in 1 year      No orders of the defined types were placed in this encounter.  The patient has a good understanding of  the overall plan. she agrees with it. she will call with any problems that may develop before the next visit here.   Harriette Ohara, MD 04/29/18

## 2018-04-30 ENCOUNTER — Telehealth: Payer: Self-pay | Admitting: Hematology and Oncology

## 2018-04-30 NOTE — Telephone Encounter (Signed)
Mailed patient calendar of upcoming appointments.  °

## 2018-08-12 ENCOUNTER — Encounter (HOSPITAL_BASED_OUTPATIENT_CLINIC_OR_DEPARTMENT_OTHER): Payer: Self-pay

## 2018-08-12 ENCOUNTER — Ambulatory Visit (HOSPITAL_BASED_OUTPATIENT_CLINIC_OR_DEPARTMENT_OTHER): Admit: 2018-08-12 | Payer: Managed Care, Other (non HMO) | Admitting: Obstetrics and Gynecology

## 2018-08-12 SURGERY — DILATATION & CURETTAGE/HYSTEROSCOPY WITH HYDROTHERMAL ABLATION
Anesthesia: Choice

## 2018-08-31 ENCOUNTER — Other Ambulatory Visit: Payer: Self-pay | Admitting: General Surgery

## 2018-08-31 DIAGNOSIS — Z9889 Other specified postprocedural states: Secondary | ICD-10-CM

## 2018-09-07 ENCOUNTER — Other Ambulatory Visit: Payer: Self-pay | Admitting: General Surgery

## 2018-09-07 DIAGNOSIS — N644 Mastodynia: Secondary | ICD-10-CM

## 2018-09-07 DIAGNOSIS — Z9889 Other specified postprocedural states: Secondary | ICD-10-CM

## 2018-09-23 ENCOUNTER — Ambulatory Visit
Admission: RE | Admit: 2018-09-23 | Discharge: 2018-09-23 | Disposition: A | Discharge status: Home / Self Care | Payer: Managed Care, Other (non HMO) | Source: Ambulatory Visit | Attending: General Surgery | Admitting: General Surgery

## 2018-09-23 ENCOUNTER — Ambulatory Visit: Payer: Managed Care, Other (non HMO)

## 2018-09-23 DIAGNOSIS — N644 Mastodynia: Secondary | ICD-10-CM

## 2018-09-23 DIAGNOSIS — Z9889 Other specified postprocedural states: Secondary | ICD-10-CM

## 2019-02-26 ENCOUNTER — Other Ambulatory Visit: Payer: Self-pay | Admitting: Hematology and Oncology

## 2019-02-26 DIAGNOSIS — C50412 Malignant neoplasm of upper-outer quadrant of left female breast: Secondary | ICD-10-CM

## 2019-04-22 NOTE — Assessment & Plan Note (Signed)
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 clinical stage ER 7% PR 0% HER-2 positive ratio 3.84 in the reconstructed left breast. Completed 6 cycles of of TCHP, currently on Herceptin maintenance that will complete November 2016  Post neoadjuvant MRI: Decrease in size of the mass from 4 cm to 2.8 cm; Final pathology counseling: pathologic complete response based on lumpectomy March 2016  Completed radiation thearpy in June 2016 Herceptin maintenance completed Nov 7th 2016 (ECHO 9/16: EF 55-60%) Started tamoxifen June 2016  Current treatment: Tamoxifen daily started June 2016  Tamoxifen Toxicities:  Intermittent hot flashes. Denies any arthralgias or myalgias  Breast cancer surveillance:  Mammogram 09/23/2018: No mammographic evidence of malignancy, breast density category B Breast exam: 04/27/2018no palpable lumps or nodules. Hemorrhagic ovarian cystand fibroids: Patient being evaluated by GYN  RTCin 1 year

## 2019-04-28 ENCOUNTER — Telehealth: Payer: Self-pay | Admitting: Hematology and Oncology

## 2019-04-28 NOTE — Telephone Encounter (Signed)
Called patient regarding upcoming Webex appointment, patient would prefer this to be a Doximity call.

## 2019-04-28 NOTE — Progress Notes (Signed)
HEMATOLOGY-ONCOLOGY DOXIMITY VISIT PROGRESS NOTE  I connected with Aimee Poole on 04/29/2019 at 11:30 AM EDT by Doximity video conference and verified that I am speaking with the correct person using two identifiers.  I discussed the limitations, risks, security and privacy concerns of performing an evaluation and management service by Doximity and the availability of in person appointments.  I also discussed with the patient that there may be a patient responsible charge related to this service. The patient expressed understanding and agreed to proceed.  Patient's Location: Home Physician Location: Clinic  CHIEF COMPLIANT: Follow-up on tamoxifen therapy  INTERVAL HISTORY: Aimee Poole is a 48 y.o. female with above-mentioned history of left breast cancer treated with neoadjuvant chemotherapy followed by lumpectomy, radiation, and who is currently on tamoxifen. I last saw her a year ago. Her most recent mammogram on 09/23/18 showed no evidence of malignancy bilaterally. She presents today over Doximity for annual follow-up.     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 The genes tested were ATM, BARD1, BRCA1, BRCA2, BRIP1, CDH1, CHEK2, MRE11A, MUTYH, NBN, NF1, PALB2, PTEN, RAD50, RAD51C, RAD51D, and TP53.    01/30/2015 Breast MRI   Left breast: Masses decrease in size from 4 cm to 2.8 cm abuts the pectoralis muscle    02/07/2015 -  Chemotherapy    Herceptin maintenance therapy to complete November 2016    02/09/2015 Surgery    Left breast lumpectomy: Pathologic complete response    03/13/2015 - 05/02/2015 Radiation Therapy    Adjuvant radiation therapy    05/30/2015 -  Anti-estrogen oral therapy    Tamoxifen 20 mg daily     REVIEW OF SYSTEMS:   Constitutional: Denies fevers, chills or abnormal weight loss Eyes: Denies blurriness of vision Ears, nose, mouth, throat, and face: Denies mucositis or sore throat Respiratory: Denies cough, dyspnea or wheezes Cardiovascular: Denies palpitation, chest discomfort Gastrointestinal: Abdominal pain and discomfort. Skin: Denies abnormal skin rashes Lymphatics: Denies new lymphadenopathy or easy bruising Neurological:Denies numbness, tingling or new weaknesses Behavioral/Psych: Mood is stable, no new changes  Extremities: No lower extremity edema Breast: Patient experiences sharp pain in the breast especially when she bends or moves around that can be quite excruciating and takes her breath away.  She also has abdominal pain and discomfort. All other systems were reviewed with the patient and are negative.  Observations/Objective:  There were no vitals filed for this visit. There is no height or weight on file to calculate BMI.  I have reviewed the data as listed CMP Latest Ref Rng & Units 02/17/2016 12/04/2015 09/11/2015  Glucose 65 - 99 mg/dL 94 111(H) 88  BUN 6 - 20 mg/dL 9 11 9.1  Creatinine 0.44 - 1.00 mg/dL 0.64 0.58 0.7  Sodium 135 - 145  mmol/L 141 139 142  Potassium 3.5 - 5.1 mmol/L 3.9 4.0 3.6  Chloride 101 - 111 mmol/L 111 108 -  CO2 22 - 32 mmol/L _0 Calcium 8.9 - 10.3 mg/dL 10.1 9.3 9.9  Total Protein 6.4 - 8.3 g/dL - - 7.1  Total Bilirubin 0.20 - 1.20 mg/dL - - <0.30  Alkaline Phos 40 - 150 U/L - - 125  AST 5 - 34 U/L - - 16  ALT 0 - 55 U/L - - 11     Lab Results  Component Value Date   WBC 5.6 02/17/2016   HGB 12.4 02/17/2016   HCT 35.3 (L) 02/17/2016   MCV 86.1 02/17/2016   PLT 250 02/17/2016   NEUTROABS 3.1 02/17/2016      Assessment Plan:  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 clinical stage ER 7% PR 0% HER-2 positive ratio 3.84 in the reconstructed left breast. Completed 6 cycles of of TCHP, currently on Herceptin maintenance that will complete November 2016  Post neoadjuvant MRI: Decrease in size of the mass from 4 cm to 2.8 cm; Final pathology counseling: pathologic complete response based on lumpectomy March 2016  Completed radiation thearpy in June 2016 Herceptin maintenance completed Nov 7th 2016 (ECHO 9/16: EF 55-60%) Started tamoxifen June 2016  Current treatment: Tamoxifen daily started June 2016  Tamoxifen Toxicities:  Intermittent hot flashes. Denies any arthralgias or myalgias  Severe breast pain: Due to scar tissue from radiation. Abdominal discomfort: Could be due to fibroids. Patient informed me that she may be applying for disability.  Breast cancer surveillance:  Mammogram 09/23/2018: No mammographic evidence of malignancy, breast density category B Breast exam: 04/27/2018no palpable lumps or nodules. Hemorrhagic ovarian cystand fibroids: Being observed by GYN  RTCin 1 year    I discussed the assessment and treatment plan with the patient. The patient was provided an opportunity to ask questions and all were answered. The patient agreed with the plan and demonstrated an understanding of the instructions. The patient was advised to call back or seek an in-person evaluation if the symptoms worsen or if the condition fails to improve as anticipated.   I provided 15 minutes of face-to-face Doximity time during this encounter.    Rulon Eisenmenger, MD 04/29/2019   I, Molly Dorshimer, am acting as  scribe for Nicholas Lose, MD.  I have reviewed the above documentation for accuracy and completeness, and I agree with the above.

## 2019-04-28 NOTE — Telephone Encounter (Signed)
Contacted patient to verify webex appt for pre reg  °

## 2019-04-29 ENCOUNTER — Inpatient Hospital Stay: Payer: Managed Care, Other (non HMO) | Attending: Hematology and Oncology | Admitting: Hematology and Oncology

## 2019-04-29 DIAGNOSIS — Z9221 Personal history of antineoplastic chemotherapy: Secondary | ICD-10-CM | POA: Diagnosis not present

## 2019-04-29 DIAGNOSIS — Z923 Personal history of irradiation: Secondary | ICD-10-CM

## 2019-04-29 DIAGNOSIS — Z170 Estrogen receptor positive status [ER+]: Secondary | ICD-10-CM

## 2019-04-29 DIAGNOSIS — C50412 Malignant neoplasm of upper-outer quadrant of left female breast: Secondary | ICD-10-CM | POA: Diagnosis not present

## 2019-04-29 DIAGNOSIS — Z79810 Long term (current) use of selective estrogen receptor modulators (SERMs): Secondary | ICD-10-CM

## 2019-04-29 MED ORDER — TAMOXIFEN CITRATE 20 MG PO TABS
20.0000 mg | ORAL_TABLET | Freq: Every day | ORAL | 3 refills | Status: DC
Start: 2019-04-29 — End: 2020-04-17

## 2019-04-30 ENCOUNTER — Telehealth: Payer: Self-pay | Admitting: Hematology and Oncology

## 2019-04-30 NOTE — Telephone Encounter (Signed)
Talk with patient regarding schedule °

## 2019-07-01 ENCOUNTER — Other Ambulatory Visit: Payer: Self-pay

## 2019-07-01 DIAGNOSIS — Z20822 Contact with and (suspected) exposure to covid-19: Secondary | ICD-10-CM

## 2019-07-03 LAB — SPECIMEN STATUS REPORT

## 2019-07-03 LAB — NOVEL CORONAVIRUS, NAA: SARS-CoV-2, NAA: NOT DETECTED

## 2019-08-25 ENCOUNTER — Other Ambulatory Visit: Payer: Self-pay | Admitting: General Surgery

## 2019-08-25 DIAGNOSIS — N644 Mastodynia: Secondary | ICD-10-CM

## 2019-09-27 ENCOUNTER — Ambulatory Visit
Admission: RE | Admit: 2019-09-27 | Discharge: 2019-09-27 | Disposition: A | Discharge status: Home / Self Care | Payer: Managed Care, Other (non HMO) | Source: Ambulatory Visit | Attending: General Surgery | Admitting: General Surgery

## 2019-09-27 ENCOUNTER — Other Ambulatory Visit: Payer: Self-pay | Admitting: General Surgery

## 2019-09-27 ENCOUNTER — Other Ambulatory Visit: Payer: Self-pay

## 2019-09-27 DIAGNOSIS — N644 Mastodynia: Secondary | ICD-10-CM

## 2019-11-03 ENCOUNTER — Other Ambulatory Visit: Payer: Self-pay

## 2019-11-03 DIAGNOSIS — Z20822 Contact with and (suspected) exposure to covid-19: Secondary | ICD-10-CM

## 2019-11-04 LAB — NOVEL CORONAVIRUS, NAA: SARS-CoV-2, NAA: NOT DETECTED

## 2019-11-08 ENCOUNTER — Other Ambulatory Visit: Payer: Self-pay

## 2019-11-08 DIAGNOSIS — Z20822 Contact with and (suspected) exposure to covid-19: Secondary | ICD-10-CM

## 2019-11-10 ENCOUNTER — Telehealth: Payer: Self-pay

## 2019-11-10 NOTE — Telephone Encounter (Signed)
Caller given negative result and verbalized understanding  

## 2019-11-11 LAB — NOVEL CORONAVIRUS, NAA: SARS-CoV-2, NAA: DETECTED — AB

## 2019-11-22 ENCOUNTER — Ambulatory Visit: Payer: Managed Care, Other (non HMO) | Attending: Internal Medicine

## 2019-11-22 DIAGNOSIS — Z20822 Contact with and (suspected) exposure to covid-19: Secondary | ICD-10-CM

## 2019-11-24 LAB — NOVEL CORONAVIRUS, NAA: SARS-CoV-2, NAA: NOT DETECTED

## 2020-01-13 ENCOUNTER — Ambulatory Visit: Payer: Managed Care, Other (non HMO)

## 2020-01-17 ENCOUNTER — Ambulatory Visit: Payer: Managed Care, Other (non HMO) | Attending: Family

## 2020-01-17 DIAGNOSIS — Z23 Encounter for immunization: Secondary | ICD-10-CM | POA: Insufficient documentation

## 2020-01-17 NOTE — Progress Notes (Signed)
   Covid-19 Vaccination Clinic  Name:  Aimee Poole    MRN: [id number] DOB: [birth year]  01/17/2020  [name] was observed post Covid-19 immunization for 15 minutes without incidence. She was provided with Vaccine Information Sheet and instruction to access the V-Safe system.   [name] was instructed to call 911 with any severe reactions post vaccine: Marland Kitchen Difficulty breathing  . Swelling of your face and throat  . A fast heartbeat  . A bad rash all over your body  . Dizziness and weakness    Immunizations Administered    Name Date Dose VIS Date Route   Moderna COVID-19 Vaccine 01/17/2020 10:00 AM 0.5 mL 10/26/2019 Intramuscular   Manufacturer: Moderna   Lot: [id number]   [id number]

## 2020-02-15 ENCOUNTER — Ambulatory Visit: Payer: Managed Care, Other (non HMO) | Attending: Family

## 2020-02-15 DIAGNOSIS — Z23 Encounter for immunization: Secondary | ICD-10-CM

## 2020-02-15 NOTE — Progress Notes (Signed)
   Covid-19 Vaccination Clinic  Name:  Aimee Poole    MRN: [id number] DOB: 03/06/1971  02/15/2020  [name] was observed post Covid-19 immunization for 15 minutes without incident. She was provided with Vaccine Information Sheet and instruction to access the V-Safe system.   [name] was instructed to call 911 with any severe reactions post vaccine: Marland Kitchen Difficulty breathing  . Swelling of face and throat  . A fast heartbeat  . A bad rash all over body  . Dizziness and weakness   Immunizations Administered    Name Date Dose VIS Date Route   Moderna COVID-19 Vaccine 02/15/2020 10:55 AM 0.5 mL 10/26/2019 Intramuscular   Manufacturer: Moderna   Lot: [id number]   [id number]

## 2020-04-15 ENCOUNTER — Other Ambulatory Visit: Payer: Self-pay | Admitting: Hematology and Oncology

## 2020-04-15 DIAGNOSIS — C50412 Malignant neoplasm of upper-outer quadrant of left female breast: Secondary | ICD-10-CM

## 2020-05-08 ENCOUNTER — Ambulatory Visit: Payer: Managed Care, Other (non HMO) | Admitting: Hematology and Oncology

## 2020-05-14 NOTE — Progress Notes (Signed)
Patient Care Team: Kelton Pillar, MD as PCP - General (Family Medicine)  DIAGNOSIS:    ICD-10-CM   1. Malignant neoplasm of upper-outer quadrant of left breast in female, estrogen receptor positive ([place])  C50.412    Z17.0     SUMMARY OF ONCOLOGIC HISTORY: Oncology History  Breast cancer of upper-outer quadrant of left female breast ([place])  05/10/2009 Initial Diagnosis   DCIS left breast Treated with left mastectomy 19.5 cm DCIS ER 0% PR 0%   09/09/2014 Relapse/Recurrence   Ultrasound left breast: hypoechoic mass measuring 1.6 x 4.4 x 3.3 cm  MRI breast: 4 cm mass in the left breast close to the pectoralis, no lymphadenopathy   09/12/2014 Initial Biopsy   Invasive ductal carcinoma ER 7%, PR 0%, HER-2 amplified ratio 3.84 average in copy #7.3   10/04/2014 - 01/17/2015 Chemotherapy   Neoadjuvant chemotherapy with Taxotere, carboplatin, Herceptin, Perjeta x6 cycles    10/12/2014 Procedure   Genetic testing was normal and did not reveal a mutation in these genes. The genes tested were ATM, BARD1, BRCA1, BRCA2, BRIP1, CDH1, CHEK2, MRE11A, MUTYH, NBN, NF1, PALB2, PTEN, RAD50, RAD51C, RAD51D, and TP53.   01/30/2015 Breast MRI   Left breast: Masses decrease in size from 4 cm to 2.8 cm abuts the pectoralis muscle   02/07/2015 -  Chemotherapy   Herceptin maintenance therapy to complete November 2016   02/09/2015 Surgery   Left breast lumpectomy: Pathologic complete response   03/13/2015 - 05/02/2015 Radiation Therapy   Adjuvant radiation therapy   05/30/2015 -  Anti-estrogen oral therapy   Tamoxifen 20 mg daily     CHIEF COMPLIANT: Follow-up of left breast cancer on tamoxifen therapy  INTERVAL HISTORY: Aimee Poole is a 49 y.o. with above-mentioned history of left breast cancer treated with neoadjuvant chemotherapy, lumpectomy, radiation, and who is currently on tamoxifen. Mammogram on 09/27/19 showed no evidence of malignancy bilaterally. She presents to the clinic today for annual  follow-up.   Today she is complaining of chest discomfort across her chest which comes on with both rest and exercise.  After she walks for a while it tends to go away.  There is some radiation of the pain down the arms.  This has been going on for a couple of weeks.  ALLERGIES:  is allergic to corn-containing products.  MEDICATIONS:  Current Outpatient Medications  Medication Sig Dispense Refill  . diphenhydramine-acetaminophen (TYLENOL PM) 25-500 MG TABS Take 1 tablet by mouth at bedtime as needed (sleep).     . naproxen (NAPROSYN) 500 MG tablet Take 1 tablet (500 mg total) by mouth 2 (two) times daily with a meal. (Patient not taking: Reported on 12/31/2017) 30 tablet 0  . tamoxifen (NOLVADEX) 20 MG tablet TAKE 1 TABLET BY MOUTH  DAILY 90 tablet 3   No current facility-administered medications for this visit.    PHYSICAL EXAMINATION: ECOG PERFORMANCE STATUS: 1 - Symptomatic but completely ambulatory  Vitals:   05/15/20 1110  BP: 123/68  Pulse: 86  Resp: 18  Temp: 98.5 F (36.9 C)  SpO2: 99%   Filed Weights   05/15/20 1110  Weight: 191 lb 1.6 oz (86.7 kg)    BREAST: No palpable masses or nodules in either right or left breasts. No palpable axillary supraclavicular or infraclavicular adenopathy no breast tenderness or nipple discharge. (exam performed in the presence of a chaperone)  LABORATORY DATA:  I have reviewed the data as listed CMP Latest Ref Rng & Units 02/17/2016 12/04/2015 09/11/2015  Glucose 65 -  99 mg/dL 94 111(H) 88  BUN 6 - 20 mg/dL 9 11 9.1  Creatinine 0.44 - 1.00 mg/dL 0.64 0.58 0.7  Sodium 135 - 145 mmol/L 141 139 142  Potassium 3.5 - 5.1 mmol/L 3.9 4.0 3.6  Chloride 101 - 111 mmol/L 111 108 -  CO2 22 - 32 mmol/L _0 Calcium 8.9 - 10.3 mg/dL 10.1 9.3 9.9  Total Protein 6.4 - 8.3 g/dL - - 7.1  Total Bilirubin 0.20 - 1.20 mg/dL - - <0.30  Alkaline Phos 40 - 150 U/L - - 125  AST 5 - 34 U/L - - 16  ALT 0 - 55 U/L - - 11    Lab Results  Component  Value Date   WBC 5.6 02/17/2016   HGB 12.4 02/17/2016   HCT 35.3 (L) 02/17/2016   MCV 86.1 02/17/2016   PLT 250 02/17/2016   NEUTROABS 3.1 02/17/2016    ASSESSMENT & PLAN:  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 clinical stage ER 7% PR 0% HER-2 positive ratio 3.84 in the reconstructed left breast. Completed 6 cycles of of TCHP, currently on Herceptin maintenance that will complete November 2016  Post neoadjuvant MRI: Decrease in size of the mass from 4 cm to 2.8 cm; Final pathology counseling: pathologic complete response based on lumpectomy March 2016  Completed radiation thearpy in June 2016 Herceptin maintenance completed Nov 7th 2016 (ECHO 9/16: EF 55-60%) Started tamoxifen June 2016  Current treatment: Tamoxifen daily started June 2016  Tamoxifen Toxicities: Intermittent hot flashes. Denies any arthralgias or myalgias   Breast pain: Due to scar tissue from radiation.  Breast cancer surveillance:  Mammogram  09/27/2019: No mammographic evidence of malignancy, breast density category B, benign postoperative seroma left lateral chest Breast exam: 6/21/2021no palpable lumps or nodules. Hemorrhagic ovarian cystand fibroids: Being observed by GYN  Chest discomfort: Because she is on tamoxifen I would like to rule out PE and we will obtain a CT chest with PE protocol.  She tells me that she had an EKG at the [facility] clinic and it was normal.  The chest pain comes on after she exerts and sometimes even at rest.  She continues to walk it gets better. I will call her with the result of the CT PE protocol.  RTCin 1 year    No orders of the defined types were placed in this encounter.  The patient has a good understanding of the overall plan. she agrees with it. she will call with any problems that may develop before the next visit here.  Total time spent: 20 mins including  face to face time and time spent for planning, charting and coordination of care  Nicholas Lose, MD 05/15/2020  I, Cloyde Reams Dorshimer, am acting as scribe for Dr. Nicholas Lose.  I have reviewed the above documentation for accuracy and completeness, and I agree with the above.

## 2020-05-15 ENCOUNTER — Other Ambulatory Visit: Payer: Self-pay

## 2020-05-15 ENCOUNTER — Inpatient Hospital Stay: Payer: Managed Care, Other (non HMO) | Attending: Hematology and Oncology | Admitting: Hematology and Oncology

## 2020-05-15 ENCOUNTER — Telehealth: Payer: Self-pay | Admitting: Hematology and Oncology

## 2020-05-15 ENCOUNTER — Ambulatory Visit (HOSPITAL_COMMUNITY)
Admission: RE | Admit: 2020-05-15 | Discharge: 2020-05-15 | Disposition: A | Discharge status: Home / Self Care | Payer: Managed Care, Other (non HMO) | Source: Ambulatory Visit | Attending: Hematology and Oncology | Admitting: Hematology and Oncology

## 2020-05-15 ENCOUNTER — Encounter (HOSPITAL_COMMUNITY): Payer: Self-pay

## 2020-05-15 DIAGNOSIS — Z79810 Long term (current) use of selective estrogen receptor modulators (SERMs): Secondary | ICD-10-CM | POA: Diagnosis not present

## 2020-05-15 DIAGNOSIS — Z170 Estrogen receptor positive status [ER+]: Secondary | ICD-10-CM | POA: Diagnosis not present

## 2020-05-15 DIAGNOSIS — R232 Flushing: Secondary | ICD-10-CM | POA: Diagnosis not present

## 2020-05-15 DIAGNOSIS — N951 Menopausal and female climacteric states: Secondary | ICD-10-CM | POA: Diagnosis not present

## 2020-05-15 DIAGNOSIS — N644 Mastodynia: Secondary | ICD-10-CM | POA: Diagnosis not present

## 2020-05-15 DIAGNOSIS — Z9221 Personal history of antineoplastic chemotherapy: Secondary | ICD-10-CM | POA: Insufficient documentation

## 2020-05-15 DIAGNOSIS — R071 Chest pain on breathing: Secondary | ICD-10-CM | POA: Diagnosis not present

## 2020-05-15 DIAGNOSIS — C50412 Malignant neoplasm of upper-outer quadrant of left female breast: Secondary | ICD-10-CM | POA: Diagnosis present

## 2020-05-15 DIAGNOSIS — Z923 Personal history of irradiation: Secondary | ICD-10-CM | POA: Diagnosis not present

## 2020-05-15 MED ORDER — SODIUM CHLORIDE (PF) 0.9 % IJ SOLN
INTRAMUSCULAR | Status: AC
  Filled 2020-05-15: qty 50

## 2020-05-15 MED ORDER — IOHEXOL 350 MG/ML SOLN
100.0000 mL | Freq: Once | INTRAVENOUS | Status: AC | PRN
  Administered 2020-05-15: 100 mL via INTRAVENOUS

## 2020-05-15 MED ORDER — VITAMIN D (ERGOCALCIFEROL) 1.25 MG (50000 UNIT) PO CAPS: 50000.0000 [IU] | ORAL_CAPSULE | ORAL | Status: DC

## 2020-05-15 NOTE — Assessment & Plan Note (Signed)
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 clinical stage ER 7% PR 0% HER-2 positive ratio 3.84 in the reconstructed left breast. Completed 6 cycles of of TCHP, currently on Herceptin maintenance that will complete November 2016  Post neoadjuvant MRI: Decrease in size of the mass from 4 cm to 2.8 cm; Final pathology counseling: pathologic complete response based on lumpectomy March 2016  Completed radiation thearpy in June 2016 Herceptin maintenance completed Nov 7th 2016 (ECHO 9/16: EF 55-60%) Started tamoxifen June 2016  Current treatment: Tamoxifen daily started June 2016  Tamoxifen Toxicities: Intermittent hot flashes. Denies any arthralgias or myalgias  Severe breast pain: Due to scar tissue from radiation. Abdominal discomfort: Could be due to fibroids. Patient informed me that she may be applying for disability.  Breast cancer surveillance:  Mammogram  09/27/2019: No mammographic evidence of malignancy, breast density category B, benign postoperative seroma left lateral chest Breast exam: 6/21/2021no palpable lumps or nodules. Hemorrhagic ovarian cystand fibroids: Being observed by GYN  RTCin 1 year

## 2020-05-15 NOTE — Telephone Encounter (Signed)
CT chest PE protocol: Negative for pulmonary embolism.

## 2020-05-17 ENCOUNTER — Telehealth: Payer: Self-pay | Admitting: Hematology and Oncology

## 2020-05-17 NOTE — Telephone Encounter (Signed)
Scheduled per 6/21 los. Called and spoke with pt, confirmed 05/15/21 appt

## 2020-07-07 ENCOUNTER — Encounter: Payer: Self-pay | Admitting: Genetic Counselor

## 2020-08-25 ENCOUNTER — Other Ambulatory Visit: Payer: Self-pay | Admitting: General Surgery

## 2020-08-25 DIAGNOSIS — Z853 Personal history of malignant neoplasm of breast: Secondary | ICD-10-CM

## 2020-09-27 ENCOUNTER — Ambulatory Visit
Admission: RE | Admit: 2020-09-27 | Discharge: 2020-09-27 | Disposition: A | Discharge status: Home / Self Care | Payer: Commercial Managed Care - PPO | Source: Ambulatory Visit | Attending: General Surgery | Admitting: General Surgery

## 2020-09-27 ENCOUNTER — Other Ambulatory Visit: Payer: Self-pay

## 2020-09-27 DIAGNOSIS — Z853 Personal history of malignant neoplasm of breast: Secondary | ICD-10-CM

## 2021-03-21 ENCOUNTER — Other Ambulatory Visit: Payer: Self-pay | Admitting: Hematology and Oncology

## 2021-03-21 DIAGNOSIS — C50412 Malignant neoplasm of upper-outer quadrant of left female breast: Secondary | ICD-10-CM

## 2021-05-11 ENCOUNTER — Telehealth: Payer: Self-pay | Admitting: Hematology and Oncology

## 2021-05-11 NOTE — Telephone Encounter (Signed)
Returned call to reschedule upcoming appointment per 6/17 schedule message. Left message to call back to reschedule.

## 2021-05-14 NOTE — Assessment & Plan Note (Deleted)
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 clinical stage ER 7% PR 0% HER-2 positive ratio 3.84 in the reconstructed left breast. Completed 6 cycles of of TCHP, currently on Herceptin maintenance that will complete November 2016  Post neoadjuvant MRI: Decrease in size of the mass from 4 cm to 2.8 cm; Final pathology counseling: pathologic complete response based on lumpectomy March 2016  Completed radiation thearpy in June 2016 Herceptin maintenance completed Nov 7th 2016 (ECHO 9/16: EF 55-60%) Started tamoxifen June 2016  Current treatment: Tamoxifen daily started June 2016  Tamoxifen Toxicities: Intermittent hot flashes. Denies any arthralgias or myalgias   Breast pain: Due to scar tissue from radiation.  Breast cancer surveillance: Mammogram 09/27/2019: No mammographic evidence of malignancy, breast density category B, benign postoperative seroma left lateral chest Breast exam: 6/21/2021no palpable lumps or nodules. Hemorrhagic ovarian cystand fibroids:Being observedby GYN  Chest discomfort: CT chest 04/2020: No PE Surveillance:  1. Mammograms 09/27/20: Benign, Density Cat B 2. Breast Exam: 05/14/21: Benign  RTCin 1 year

## 2021-05-15 ENCOUNTER — Inpatient Hospital Stay: Payer: Commercial Managed Care - PPO | Attending: Hematology and Oncology | Admitting: Hematology and Oncology

## 2021-09-05 ENCOUNTER — Other Ambulatory Visit: Payer: Self-pay | Admitting: General Surgery

## 2021-09-05 ENCOUNTER — Telehealth: Payer: Self-pay | Admitting: Hematology and Oncology

## 2021-09-05 DIAGNOSIS — Z1231 Encounter for screening mammogram for malignant neoplasm of breast: Secondary | ICD-10-CM

## 2021-09-05 NOTE — Telephone Encounter (Signed)
Rescheduled per 10/12 in basket, message was left with pt

## 2021-09-08 NOTE — Progress Notes (Signed)
HEMATOLOGY-ONCOLOGY MYCHART VIDEO VISIT PROGRESS NOTE  I connected with Aimee Poole on 09/10/2021 at 11:15 AM EDT by MyChart video conference and verified that I am speaking with the correct person using two identifiers.  I discussed the limitations, risks, security and privacy concerns of performing an evaluation and management service by MyChart and the availability of in person appointments.  I also discussed with the patient that there may be a patient responsible charge related to this service. The patient expressed understanding and agreed to proceed.  Patient's Location: Home Physician Location: Clinic  CHIEF COMPLIANT: Follow-up of left breast cancer on tamoxifen therapy  INTERVAL HISTORY: Aimee Poole is a 50 y.o. female with above-mentioned history of left breast cancer treated with neoadjuvant chemotherapy, lumpectomy, radiation, and who is currently on tamoxifen. Mammogram on 09/27/2020 showed no evidence of malignancy bilaterally. She presents via [place] today for annual follow-up.  Her major complaint today is neuropathy in her feet.  She rates it as 5 out of 10.  She does not take anything for the neuropathy.  Sometimes it can be painful sometimes can be mild.  Oncology History  Breast cancer of upper-outer quadrant of left female breast ([place])  05/10/2009 Initial Diagnosis   DCIS left breast Treated with left mastectomy 19.5 cm DCIS ER 0% PR 0%   09/09/2014 Relapse/Recurrence   Ultrasound left breast: hypoechoic mass measuring 1.6 x 4.4 x 3.3 cm  MRI breast: 4 cm mass in the left breast close to the pectoralis, no lymphadenopathy   09/12/2014 Initial Biopsy   Invasive ductal carcinoma ER 7%, PR 0%, HER-2 amplified ratio 3.84 average in copy #7.3   10/04/2014 - 01/17/2015 Chemotherapy   Neoadjuvant chemotherapy with Taxotere, carboplatin, Herceptin, Perjeta x6 cycles    10/12/2014 Procedure   Genetic testing was normal and did not reveal a mutation in these genes. The  genes tested were ATM, BARD1, BRCA1, BRCA2, BRIP1, CDH1, CHEK2, MRE11A, MUTYH, NBN, NF1, PALB2, PTEN, RAD50, RAD51C, RAD51D, and TP53.   01/30/2015 Breast MRI   Left breast: Masses decrease in size from 4 cm to 2.8 cm abuts the pectoralis muscle   02/07/2015 -  Chemotherapy   Herceptin maintenance therapy to complete November 2016   02/09/2015 Surgery   Left breast lumpectomy: Pathologic complete response   03/13/2015 - 05/02/2015 Radiation Therapy   Adjuvant radiation therapy   05/30/2015 -  Anti-estrogen oral therapy   Tamoxifen 20 mg daily     Observations/Objective:  There were no vitals filed for this visit. There is no height or weight on file to calculate BMI.  I have reviewed the data as listed CMP Latest Ref Rng & Units 02/17/2016 12/04/2015 09/11/2015  Glucose 65 - 99 mg/dL 94 111(H) 88  BUN 6 - 20 mg/dL 9 11 9.1  Creatinine 0.44 - 1.00 mg/dL 0.64 0.58 0.7  Sodium 135 - 145 mmol/L 141 139 142  Potassium 3.5 - 5.1 mmol/L 3.9 4.0 3.6  Chloride 101 - 111 mmol/L 111 108 -  CO2 22 - 32 mmol/L 22 22 26   Calcium 8.9 - 10.3 mg/dL 10.1 9.3 9.9  Total Protein 6.4 - 8.3 g/dL - - 7.1  Total Bilirubin 0.20 - 1.20 mg/dL - - <0.30  Alkaline Phos 40 - 150 U/L - - 125  AST 5 - 34 U/L - - 16  ALT 0 - 55 U/L - - 11    Lab Results  Component Value Date   WBC 5.6 02/17/2016   HGB 12.4 02/17/2016  HCT 35.3 (L) 02/17/2016   MCV 86.1 02/17/2016   PLT 250 02/17/2016   NEUTROABS 3.1 02/17/2016      Assessment Plan:  Breast cancer of upper-outer quadrant of left female breast ([place]) 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 clinical stage ER 7% PR 0% HER-2 positive ratio 3.84 in the reconstructed left breast. Completed 6 cycles of of TCHP, currently on Herceptin maintenance that will complete November 2016   Post neoadjuvant MRI: Decrease in size of the mass from 4 cm to 2.8 cm; Final pathology counseling:  pathologic complete response based on lumpectomy March  2016   Completed radiation thearpy in June 2016 Herceptin maintenance completed Nov 7th 2016 (ECHO 9/16: EF 55-60%) Started tamoxifen June 2016   Current treatment: Tamoxifen daily started June 2016   Tamoxifen Toxicities:  Intermittent hot flashes. Denies any arthralgias or myalgias    Breast pain: Due to scar tissue from radiation.   Breast cancer surveillance:   Mammogram 09/27/2020: Benign breast density category B  Neuropathy: we will send her info on Neuropathy study I recommended participation in the phase 2 [facility] [place] 2102 study of N-Palmitoylrthanolamide vs placebo for the treatment of chemo induced peripheral neuropathy.  Return to clinic in 1 year for follow-up.    I discussed the assessment and treatment plan with the patient. The patient was provided an opportunity to ask questions and all were answered. The patient agreed with the plan and demonstrated an understanding of the instructions. The patient was advised to call back or seek an in-person evaluation if the symptoms worsen or if the condition fails to improve as anticipated.   Total time spent: 20 minutes including face-to-face MyChart video visit time and time spent for planning, charting and coordination of care  Rulon Eisenmenger, MD 09/10/2021  I, Thana Ates am acting as scribe for Nicholas Lose, MD.  I have reviewed the above documentation for accuracy and completeness, and I agree with the above.

## 2021-09-10 ENCOUNTER — Inpatient Hospital Stay: Payer: Commercial Managed Care - PPO | Attending: Hematology and Oncology | Admitting: Hematology and Oncology

## 2021-09-10 DIAGNOSIS — Z170 Estrogen receptor positive status [ER+]: Secondary | ICD-10-CM | POA: Diagnosis not present

## 2021-09-10 DIAGNOSIS — C50412 Malignant neoplasm of upper-outer quadrant of left female breast: Secondary | ICD-10-CM | POA: Diagnosis not present

## 2021-09-10 NOTE — Assessment & Plan Note (Signed)
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 clinical stage ER 7% PR 0% HER-2 positive ratio 3.84 in the reconstructed left breast. Completed 6 cycles of of TCHP, currently on Herceptin maintenance that will complete November 2016  Post neoadjuvant MRI: Decrease in size of the mass from 4 cm to 2.8 cm; Final pathology counseling: pathologic complete response based on lumpectomy March 2016  Completed radiation thearpy in June 2016 Herceptin maintenance completed Nov 7th 2016 (ECHO 9/16: EF 55-60%) Started tamoxifen June 2016  Current treatment: Tamoxifen daily started June 2016  Tamoxifen Toxicities: Intermittent hot flashes. Denies any arthralgias or myalgias  Breast pain: Due to scar tissue from radiation.  Breast cancer surveillance:  Mammogram11/01/2020: Benign breast density category B Breast exam 09/10/2021: Benign  Return to clinic in 1 year for follow-up of

## 2021-09-11 ENCOUNTER — Ambulatory Visit: Payer: Commercial Managed Care - PPO | Admitting: Hematology and Oncology

## 2021-09-17 ENCOUNTER — Telehealth: Payer: Self-pay | Admitting: Emergency Medicine

## 2021-09-17 NOTE — Telephone Encounter (Signed)
[facility]-2102 - TREATMENT OF ESTABLISHED CHEMOTHERAPY-INDUCED NEUROPATHY WITH N-PALMITOYLETHANOLAMIDE, A CANNABIMIMETIC NUTRACEUTICAL: A RANDOMIZED DOUBLE-BLIND PHASE II PILOT TRIAL  Contacted pt regarding potential interest in study, no answer.  Left VM with contact information requesting a call back.  Will try again at another time.  Wells Guiles 'Learta CoddingNeysa Bonito, RN, BSN Clinical Research Nurse I 09/17/21 10:09 AM

## 2021-09-18 ENCOUNTER — Telehealth: Payer: Self-pay | Admitting: Emergency Medicine

## 2021-09-18 NOTE — Telephone Encounter (Signed)
[facility]-[place]-2102 - TREATMENT OF ESTABLISHED CHEMOTHERAPY-INDUCED NEUROPATHY WITH N-PALMITOYLETHANOLAMIDE, A CANNABIMIMETIC NUTRACEUTICAL: A RANDOMIZED DOUBLE-BLIND PHASE II PILOT TRIAL  Attempted to contact pt to f/u on potential interest in study.  No answer, did not leave VM as one was left yesterday with all necessary contact info.  Will attempt to contact pt again.  Wells Guiles 'Learta CoddingNeysa Bonito, RN, BSN Clinical Research Nurse I 09/18/21 3:55 PM

## 2021-09-27 ENCOUNTER — Telehealth: Payer: Self-pay | Admitting: Emergency Medicine

## 2021-09-27 NOTE — Telephone Encounter (Signed)
[facility]-[place]-2102 - TREATMENT OF ESTABLISHED CHEMOTHERAPY-INDUCED NEUROPATHY WITH N-PALMITOYLETHANOLAMIDE, A CANNABIMIMETIC NUTRACEUTICAL: A RANDOMIZED DOUBLE-BLIND PHASE II PILOT TRIAL  Attempted to contact patient regarding study interest, no answer.  Left VM with contact info with instructions to return call if interested in study.  Wells Guiles 'Learta CoddingNeysa Bonito, RN, BSN Clinical Research Nurse I 09/27/21 10:46 AM

## 2021-10-04 ENCOUNTER — Other Ambulatory Visit: Payer: Self-pay

## 2021-10-04 ENCOUNTER — Ambulatory Visit
Admission: RE | Admit: 2021-10-04 | Discharge: 2021-10-04 | Disposition: A | Discharge status: Home / Self Care | Payer: Commercial Managed Care - PPO | Source: Ambulatory Visit | Attending: General Surgery | Admitting: General Surgery

## 2021-10-04 DIAGNOSIS — Z1231 Encounter for screening mammogram for malignant neoplasm of breast: Secondary | ICD-10-CM

## 2022-02-22 ENCOUNTER — Other Ambulatory Visit: Payer: Self-pay | Admitting: Hematology and Oncology

## 2022-02-22 DIAGNOSIS — C50412 Malignant neoplasm of upper-outer quadrant of left female breast: Secondary | ICD-10-CM

## 2022-09-24 ENCOUNTER — Other Ambulatory Visit: Payer: Self-pay | Admitting: General Surgery

## 2022-09-24 DIAGNOSIS — Z1231 Encounter for screening mammogram for malignant neoplasm of breast: Secondary | ICD-10-CM

## 2022-10-28 ENCOUNTER — Ambulatory Visit
Admission: RE | Admit: 2022-10-28 | Discharge: 2022-10-28 | Disposition: A | Discharge status: Home / Self Care | Payer: Commercial Managed Care - PPO | Source: Ambulatory Visit | Attending: General Surgery | Admitting: General Surgery

## 2022-10-28 DIAGNOSIS — Z1231 Encounter for screening mammogram for malignant neoplasm of breast: Secondary | ICD-10-CM

## 2023-01-21 ENCOUNTER — Other Ambulatory Visit (HOSPITAL_COMMUNITY)
Admission: RE | Admit: 2023-01-21 | Discharge: 2023-01-21 | Disposition: A | Discharge status: Home / Self Care | Payer: Commercial Managed Care - PPO | Source: Ambulatory Visit | Attending: Obstetrics and Gynecology | Admitting: Obstetrics and Gynecology

## 2023-01-21 ENCOUNTER — Ambulatory Visit (INDEPENDENT_AMBULATORY_CARE_PROVIDER_SITE_OTHER): Payer: Commercial Managed Care - PPO | Admitting: Obstetrics and Gynecology

## 2023-01-21 ENCOUNTER — Other Ambulatory Visit: Payer: Self-pay

## 2023-01-21 ENCOUNTER — Encounter: Payer: Self-pay | Admitting: Obstetrics and Gynecology

## 2023-01-21 VITALS — BP 157/82 | HR 96 | Ht 62.0 in | Wt 193.0 lb

## 2023-01-21 DIAGNOSIS — N950 Postmenopausal bleeding: Secondary | ICD-10-CM | POA: Insufficient documentation

## 2023-01-21 DIAGNOSIS — Z124 Encounter for screening for malignant neoplasm of cervix: Secondary | ICD-10-CM | POA: Insufficient documentation

## 2023-01-21 NOTE — Patient Instructions (Signed)
[phone]  central scheduling to schedule your ultrasound

## 2023-01-21 NOTE — Progress Notes (Signed)
NEW GYNECOLOGY PATIENT Patient name: Aimee Poole MRN [id number]  Date of birth: 1971-10-26 Chief Complaint:   New Patient (Initial Visit) and Vaginal Bleeding (Finished cancer treatment 2015)     History:  Aimee Poole is a 52 y.o. . being seen today for abnormal bleeding.  8 years post breast cancer therapy.   No menses while getting chemotherapy. Didn't get chemotherpy with first breast cancer, dx again 2015 and underwent chemotherapy. Has been on tamoxifen therapy for years and  had been without amenstrual cycle for at least 1 year and then started bleeding in January. Had prolonged bleeding for several days in January and bled again in February.       Gynecologic History Patient's last menstrual period was 11/27/2022. Contraception: post menopausal status Last Pap:     Component Value Date/Time   DIAGPAP  10/08/2017 0000    NEGATIVE FOR INTRAEPITHELIAL LESIONS OR MALIGNANCY.   ADEQPAP  10/08/2017 0000    Satisfactory for evaluation  endocervical/transformation zone component PRESENT.   Last Mammogram: 10/2022 BIRADS 1   Obstetric History OB History  No obstetric history on file.    Past Medical History:  Diagnosis Date   Breast cancer ([place]) 2010   left breast   Breast cancer ([place]) 2015   recurrent left breast   Cough 02/02/2015   productive of yellow mucus   History of chemotherapy    finished chemo 01/17/2015   Personal history of chemotherapy 2015   Personal history of radiation therapy 2015   Recurrent cancer of left breast ([place]) 01/2015    Past Surgical History:  Procedure Laterality Date   BREAST LUMPECTOMY Left 2016   BREAST LUMPECTOMY WITH RADIOACTIVE SEED LOCALIZATION Left 02/09/2015   Procedure: LEFT BREAST LUMPECTOMY WITH RADIOACTIVE SEED LOCALIZATION;  Surgeon: Rolm Bookbinder, MD;  Location: [facility];  Service: General;  Laterality: Left;   BREAST REDUCTION SURGERY Right 05/30/2010   CESAREAN SECTION  2008   x 1   CYSTECTOMY      ovary as well as scar tissue   ECTOPIC PREGNANCY SURGERY Left 04/18/2006   partial exc. distal tube   INCISIONAL HERNIA REPAIR  01/23/2010   MASTECTOMY Left 11/03/2009   TRAM 2011   PORTACATH PLACEMENT     RECONSTRUCTION BREAST W/ TRAM FLAP Left 01/23/2010   REDUCTION MAMMAPLASTY Right    REVISION RECONSTRUCTED BREAST Left 05/30/2010   UTERINE FIBROID SURGERY     VASCULAR DELAY PRE-TRAM Left 11/03/2009    Current Outpatient Medications on File Prior to Visit  Medication Sig Dispense Refill   Multiple Vitamins-Minerals (CENTRUM MULTIGUMMIES PO) Take by mouth.     tamoxifen (NOLVADEX) 20 MG tablet TAKE 1 TABLET BY MOUTH  DAILY 90 tablet 3   No current facility-administered medications on file prior to visit.    Allergies  Allergen Reactions   Corn-Containing Products Hives    Social History:  reports that she has never smoked. She has never used smokeless tobacco. She reports that she does not drink alcohol and does not use drugs.  Family History  Problem Relation Age of Onset   Colon cancer Maternal Uncle    Breast cancer Cousin    Breast cancer Maternal Aunt 59       currently 7    The following portions of the patient's history were reviewed and updated as appropriate: allergies, current medications, past family history, past medical history, past social history, past surgical history and problem list.  Review of Systems Pertinent items  noted in HPI and remainder of comprehensive ROS otherwise negative.  Physical Exam:  BP (!) 157/82   Pulse 96   Ht '5\' 2"'$  (1.575 m)   Wt 193 lb (87.5 kg)   LMP 11/27/2022   BMI 35.30 kg/m  Physical Exam Vitals and nursing note reviewed.  Constitutional:      Appearance: Normal appearance.  Cardiovascular:     Rate and Rhythm: Normal rate.  Pulmonary:     Effort: Pulmonary effort is normal.     Breath sounds: Normal breath sounds.  Neurological:     General: No focal deficit present.     Mental Status: She is alert and oriented to  person, place, and time.  Psychiatric:        Mood and Affect: Mood normal.        Behavior: Behavior normal.        Thought Content: Thought content normal.        Judgment: Judgment normal.    Endometrial Biopsy Procedure  Patient identified, informed consent performed,  indication reviewed, consent signed.  Reviewed risk of perforation, pain, bleeding, insufficient sample, etc were reviewd. Time out was performed.  Urine pregnancy test negative.  Speculum placed in the vagina.  Cervix visualized.  Cleaned with Betadine x 2.  Grasped anteriorly with a single tooth tenaculum.  Paracervical block was not administered and the endocervical canal.  The endometrial pipelle was passed twice without difficulty, sounded to at least 10cm and sample obtained. Tenaculum was removed, good hemostasis noted.  Patient tolerated procedure well.  Patient was given post-procedure instructions.     Assessment and Plan:   1. Postmenopausal bleeding New onset PMB with unclear etiology, risk of polyp vs hyperplasia vs malignancy, especially within context of tamoxifen therapy. Pelvic US ordered to assess EMS. Now s/p uncomplicated EMB today and will follow up accordingly.  - US PELVIS TRANSVAGINAL NON-OB (TV ONLY); Future - Cytology - PAP - Surgical pathology  2. Screening for cervical cancer Routine pap collected today  - Cytology - PAP   Routine preventative health maintenance measures emphasized. Please refer to After Visit Summary for other counseling recommendations.   Follow-up: No follow-ups on file.      Darliss Cheney, MD Obstetrician & Gynecologist, [facility] [employer], [place]

## 2023-01-23 LAB — CYTOLOGY - PAP
Comment: NEGATIVE
Diagnosis: NEGATIVE
High risk HPV: NEGATIVE

## 2023-01-24 LAB — SURGICAL PATHOLOGY

## 2023-02-11 ENCOUNTER — Ambulatory Visit
Admission: RE | Admit: 2023-02-11 | Discharge: 2023-02-11 | Disposition: A | Discharge status: Home / Self Care | Payer: Commercial Managed Care - PPO | Source: Ambulatory Visit | Attending: Obstetrics and Gynecology | Admitting: Obstetrics and Gynecology

## 2023-02-11 DIAGNOSIS — N950 Postmenopausal bleeding: Secondary | ICD-10-CM | POA: Diagnosis not present

## 2023-02-13 ENCOUNTER — Telehealth: Payer: Self-pay | Admitting: Obstetrics and Gynecology

## 2023-02-13 DIAGNOSIS — N950 Postmenopausal bleeding: Secondary | ICD-10-CM

## 2023-02-13 DIAGNOSIS — N838 Other noninflammatory disorders of ovary, fallopian tube and broad ligament: Secondary | ICD-10-CM

## 2023-02-13 NOTE — Telephone Encounter (Signed)
Called patient regarding results of ultrasound results. Ultrasound demonstrates endometrial polyp as well as enlarged left ovary. Discussion of pelvic MRI to better characterize left ovarian lesion. Recommend at least hysteroscopy for polypectomy possible laparoscopic oophorectomy pending further workup of ovarian lesion     FINDINGS: Uterus   Measurements: 12.6 x 6.5 x 7.4 cm = volume: 314 mL. Anteverted. Enlarged. Heterogeneous. No focal mass.   Endometrium   Thickness: 31 mm. Markedly thickened, heterogeneous. No discrete mass or fluid.   Right ovary   Surgically absent   Left ovary   Measurements: 7.3 x 4.6 x 5.7 cm = volume: 99 mL. Enlarged, hypoechoic, question containing a complicated cyst or mass 4.4 cm diameter. This is incompletely visualized and characterized due to overlying bowel. Cannot exclude ovarian neoplasm due to limited visualization.   Other findings   No free pelvic fluid or additional pelvic masses.   IMPRESSION: Surgical absence of RIGHT ovary.   Enlarged uterus with markedly thickened heterogeneous endometrial complex 31 mm thick; In the setting of post-menopausal bleeding, endometrial sampling is indicated to exclude carcinoma. If results are benign, sonohysterogram should be considered for focal lesion work-up. (Ref: Radiological Reasoning: Algorithmic Workup of Abnormal Vaginal Bleeding with Endovaginal Sonography and Sonohysterography. AJR [id number])   Enlarged LEFT ovary questionably containing a complicated cyst or mass 4.4 cm diameter, unable to completely characterize due to overlying bowel; recommend further characterization by MR imaging with and without contrast.   These results will be called to the ordering clinician or representative by the Radiologist Assistant, and communication documented in the PACS or [employer].     Electronically Signed   By: Lavonia Dana M.D.   On: 02/11/2023 16:20

## 2023-02-13 NOTE — Telephone Encounter (Signed)
I have attempted to contact this patient by phone, voicemail left.  I will continue to try later.  [phone] [facility]

## 2023-02-27 ENCOUNTER — Telehealth: Payer: Self-pay

## 2023-02-27 NOTE — Telephone Encounter (Signed)
Pt called to ask if she should be continuing Tamoxifen. She reports she has a 30 day supply left. Appt made for pt to see MD 03/17/23 at 1030. She knows to arrive at 1015 for check in.

## 2023-03-12 ENCOUNTER — Ambulatory Visit
Admission: RE | Admit: 2023-03-12 | Discharge: 2023-03-12 | Disposition: A | Discharge status: Home / Self Care | Payer: Commercial Managed Care - PPO | Source: Ambulatory Visit | Attending: Obstetrics and Gynecology | Admitting: Obstetrics and Gynecology

## 2023-03-12 ENCOUNTER — Encounter: Payer: Self-pay | Admitting: Obstetrics and Gynecology

## 2023-03-12 DIAGNOSIS — N950 Postmenopausal bleeding: Secondary | ICD-10-CM

## 2023-03-12 DIAGNOSIS — N838 Other noninflammatory disorders of ovary, fallopian tube and broad ligament: Secondary | ICD-10-CM

## 2023-03-12 MED ORDER — GADOPICLENOL 0.5 MMOL/ML IV SOLN
9.0000 mL | Freq: Once | INTRAVENOUS | Status: AC | PRN
  Administered 2023-03-12: 9 mL via INTRAVENOUS

## 2023-03-14 ENCOUNTER — Telehealth: Payer: Self-pay | Admitting: Obstetrics and Gynecology

## 2023-03-14 NOTE — Telephone Encounter (Signed)
Called patient and confirmed ID x2. Reviewed MRI images - ovarian cyst appears benign. Will focus on endometrial lining and removal to get definitive tissue diagnosis.

## 2023-03-14 NOTE — Progress Notes (Signed)
Patient Care Team: Pa, [facility] as PCP - General  DIAGNOSIS: No diagnosis found.  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 The genes tested were ATM, BARD1, BRCA1, BRCA2, BRIP1, CDH1, CHEK2, MRE11A, MUTYH, NBN, NF1, PALB2, PTEN, RAD50, RAD51C, RAD51D, and TP53.   01/30/2015 Breast MRI   Left breast: Masses decrease in size from 4 cm to 2.8 cm abuts the pectoralis muscle   02/07/2015 -  Chemotherapy   Herceptin maintenance therapy to complete November 2016   02/09/2015 Surgery   Left breast lumpectomy: Pathologic complete response   03/13/2015 - 05/02/2015 Radiation Therapy   Adjuvant radiation therapy   05/30/2015 -  Anti-estrogen oral therapy   Tamoxifen 20 mg daily     CHIEF COMPLIANT:  Follow-up of left breast cancer on tamoxifen therapy   INTERVAL HISTORY: Aimee Poole is a 52 y.o. with above-mentioned history of left breast cancer treated with neoadjuvant chemotherapy, lumpectomy, radiation, and who is currently on tamoxifen. She presents to the clinic for a follow-up.     ALLERGIES:  is allergic to corn-containing products.  MEDICATIONS:  Current Outpatient Medications  Medication Sig Dispense Refill   Multiple Vitamins-Minerals (CENTRUM MULTIGUMMIES PO)  Take by mouth.     tamoxifen (NOLVADEX) 20 MG tablet TAKE 1 TABLET BY MOUTH  DAILY 90 tablet 3   No current facility-administered medications for this visit.    PHYSICAL EXAMINATION: ECOG PERFORMANCE STATUS: {CHL ONC ECOG PS:[phone]}  There were no vitals filed for this visit. There were no vitals filed for this visit.  BREAST:*** No palpable masses or nodules in either right or left breasts. No palpable axillary supraclavicular or infraclavicular adenopathy no breast tenderness or nipple discharge. (exam performed in the presence of a chaperone)  LABORATORY DATA:  I have reviewed the data as listed    Latest Ref Rng & Units 02/17/2016    5:47 PM 12/04/2015    7:55 AM 09/11/2015   12:51 PM  CMP  Glucose 65 - 99 mg/dL 94  161  88   BUN 6 - 20 mg/dL 9  11  9.1   Creatinine 0.44 - 1.00 mg/dL 0.96  0.45  0.7   Sodium 135 - 145 mmol/L 141  139  142   Potassium 3.5 - 5.1 mmol/L 3.9  4.0  3.6   Chloride 101 - 111 mmol/L 111  108    CO2 22 - 32 mmol/L Calcium 8.9 - 10.3 mg/dL 40.9  9.3  9.9   Total Protein 6.4 - 8.3 g/dL   7.1   Total Bilirubin 0.20 - 1.20 mg/dL   <8.11   Alkaline Phos 40 - 150 U/L   125   AST 5 - 34 U/L   16  ALT 0 - 55 U/L   11     Lab Results  Component Value Date   WBC 5.6 02/17/2016   HGB 12.4 02/17/2016   HCT 35.3 (L) 02/17/2016   MCV 86.1 02/17/2016   PLT 250 02/17/2016   NEUTROABS 3.1 02/17/2016    ASSESSMENT & PLAN:  No problem-specific Assessment & Plan notes found for this encounter.    No orders of the defined types were placed in this encounter.  The patient has a good understanding of the overall plan. she agrees with it. she will call with any problems that may develop before the next visit here. Total time spent: 30 mins including face to face time and time spent for planning, charting and co-ordination of care   Sherlyn Lick, CMA 03/14/23    I Janan Ridge am acting as a Neurosurgeon for [employer]  ***

## 2023-03-17 ENCOUNTER — Inpatient Hospital Stay: Payer: Commercial Managed Care - PPO | Attending: Hematology and Oncology | Admitting: Hematology and Oncology

## 2023-03-17 VITALS — BP 137/69 | HR 96 | Temp 97.2°F | Resp 18 | Ht 62.0 in | Wt 193.4 lb

## 2023-03-17 DIAGNOSIS — C50412 Malignant neoplasm of upper-outer quadrant of left female breast: Secondary | ICD-10-CM

## 2023-03-17 DIAGNOSIS — Z923 Personal history of irradiation: Secondary | ICD-10-CM | POA: Diagnosis not present

## 2023-03-17 DIAGNOSIS — C541 Malignant neoplasm of endometrium: Secondary | ICD-10-CM | POA: Diagnosis not present

## 2023-03-17 DIAGNOSIS — Z170 Estrogen receptor positive status [ER+]: Secondary | ICD-10-CM | POA: Diagnosis not present

## 2023-03-17 NOTE — Assessment & Plan Note (Signed)
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 clinical stage ER 7% PR 0% HER-2 positive ratio 3.84 in the reconstructed left breast. Completed 6 cycles of of TCHP, currently on Herceptin maintenance that will complete November 2016   Post neoadjuvant MRI: Decrease in size of the mass from 4 cm to 2.8 cm; Final pathology counseling:  pathologic complete response based on lumpectomy March 2016   Completed radiation thearpy in June 2016 Herceptin maintenance completed Nov 7th 2016 (ECHO 9/16: EF 55-60%) Started tamoxifen June 2016   Current treatment: Tamoxifen daily started June 2016   Tamoxifen Toxicities:  Intermittent hot flashes. Denies any arthralgias or myalgias    Breast pain: Due to scar tissue from radiation.   Breast cancer surveillance:   Mammogram 10/29/2022: Benign breast density category B   Neuropathy:  MRI pelvis 03/13/2023: Expansile contrast-enhancing mass endometrial cavity 8.3 cm consistent with endometrial malignancy.  Left ovary cyst 5.4 cm  I recommended discontinuation of tamoxifen therapy at this time as she undergoes further evaluation

## 2023-03-18 ENCOUNTER — Telehealth: Payer: Self-pay | Admitting: Hematology and Oncology

## 2023-03-18 NOTE — Telephone Encounter (Signed)
Scheduled appointment per 4/22 los. Patient is aware of the made appointments.

## 2023-03-24 ENCOUNTER — Encounter (HOSPITAL_COMMUNITY): Payer: Self-pay | Admitting: Obstetrics and Gynecology

## 2023-03-24 ENCOUNTER — Other Ambulatory Visit: Payer: Self-pay

## 2023-03-24 NOTE — Progress Notes (Signed)
SDW call  Patient was given pre-op instructions over the phone. Patient verbalized understanding of instructions provided.     PCP - [facility] Oncologist: Dr. Serena Croissant  PPM/ICD - Denies  Chest x-ray -  EKG -   Stress Test - ECHO -  Cardiac Cath -   Sleep Study/sleep apnea/CPAP: Denies  Non-diabetic   Blood Thinner Instructions: Denies Aspirin Instructions:Denies   ERAS Protcol - Yes, clear fluids until 1115 PRE-SURGERY Ensure or G2-    COVID TEST- n/a    Anesthesia review: No   Patient denies shortness of breath, fever, cough and chest pain over the phone call  Your procedure is scheduled on Wednesday Mar 26, 2023  Report to [facility] Main Entrance "A" at  1145  A.M., then check in with the Admitting office.  Call this number if you have problems the morning of surgery:  [phone]   If you have any questions prior to your surgery date call [phone]: Open Monday-Friday 8am-4pm If you experience any cold or flu symptoms such as cough, fever, chills, shortness of breath, etc. between now and your scheduled surgery, please notify us at the above number     Remember:  Do not eat after midnight the night before your surgery  You may drink clear liquids until  1115 the morning of your surgery.   Clear liquids allowed are: Water, Non-Citrus Juices (without pulp), Carbonated Beverages, Clear Tea, Black Coffee ONLY (NO MILK, CREAM OR POWDERED CREAMER of any kind), and Gatorade   Take these medicines the morning of surgery with A SIP OF WATER: None As of today, STOP taking any Aspirin (unless otherwise instructed by your surgeon) Aleve, Naproxen, Ibuprofen, Motrin, Advil, Goody's, BC's, all herbal medications, fish oil, and all vitamins.

## 2023-03-26 ENCOUNTER — Ambulatory Visit (HOSPITAL_COMMUNITY): Payer: Commercial Managed Care - PPO | Admitting: Anesthesiology

## 2023-03-26 ENCOUNTER — Ambulatory Visit (HOSPITAL_COMMUNITY)
Admission: RE | Admit: 2023-03-26 | Discharge: 2023-03-26 | Disposition: A | Discharge status: Home / Self Care | Payer: Commercial Managed Care - PPO | Attending: Obstetrics and Gynecology | Admitting: Obstetrics and Gynecology

## 2023-03-26 ENCOUNTER — Encounter (HOSPITAL_COMMUNITY): Payer: Self-pay | Admitting: Obstetrics and Gynecology

## 2023-03-26 ENCOUNTER — Encounter (HOSPITAL_COMMUNITY)
Admission: RE | Disposition: A | Discharge status: Home / Self Care | Payer: Self-pay | Source: Home / Self Care | Attending: Obstetrics and Gynecology

## 2023-03-26 ENCOUNTER — Other Ambulatory Visit: Payer: Self-pay

## 2023-03-26 ENCOUNTER — Ambulatory Visit (HOSPITAL_BASED_OUTPATIENT_CLINIC_OR_DEPARTMENT_OTHER): Payer: Commercial Managed Care - PPO | Admitting: Anesthesiology

## 2023-03-26 DIAGNOSIS — Z853 Personal history of malignant neoplasm of breast: Secondary | ICD-10-CM | POA: Diagnosis not present

## 2023-03-26 DIAGNOSIS — E669 Obesity, unspecified: Secondary | ICD-10-CM

## 2023-03-26 DIAGNOSIS — Z6837 Body mass index (BMI) 37.0-37.9, adult: Secondary | ICD-10-CM | POA: Diagnosis not present

## 2023-03-26 DIAGNOSIS — N840 Polyp of corpus uteri: Secondary | ICD-10-CM | POA: Diagnosis not present

## 2023-03-26 DIAGNOSIS — N924 Excessive bleeding in the premenopausal period: Secondary | ICD-10-CM

## 2023-03-26 DIAGNOSIS — Z6836 Body mass index (BMI) 36.0-36.9, adult: Secondary | ICD-10-CM | POA: Diagnosis not present

## 2023-03-26 DIAGNOSIS — N950 Postmenopausal bleeding: Secondary | ICD-10-CM | POA: Diagnosis not present

## 2023-03-26 DIAGNOSIS — Z5941 Food insecurity: Secondary | ICD-10-CM | POA: Insufficient documentation

## 2023-03-26 HISTORY — PX: HYSTEROSCOPY WITH D & C: SHX1775

## 2023-03-26 LAB — CBC
HCT: 38.6 % (ref 36.0–46.0)
Hemoglobin: 13 g/dL (ref 12.0–15.0)
MCH: 29.3 pg (ref 26.0–34.0)
MCHC: 33.7 g/dL (ref 30.0–36.0)
MCV: 87.1 fL (ref 80.0–100.0)
Platelets: 269 10*3/uL (ref 150–400)
RBC: 4.43 MIL/uL (ref 3.87–5.11)
RDW: 12 % (ref 11.5–15.5)
WBC: 5.9 10*3/uL (ref 4.0–10.5)
nRBC: 0 % (ref 0.0–0.2)

## 2023-03-26 LAB — POCT PREGNANCY, URINE: Preg Test, Ur: NEGATIVE

## 2023-03-26 SURGERY — DILATATION AND CURETTAGE /HYSTEROSCOPY
Anesthesia: General | Site: Uterus

## 2023-03-26 MED ORDER — LIDOCAINE 2% (20 MG/ML) 5 ML SYRINGE
INTRAMUSCULAR | Status: DC | PRN
  Administered 2023-03-26: 60 mg via INTRAVENOUS

## 2023-03-26 MED ORDER — LACTATED RINGERS IV SOLN: INTRAVENOUS | Status: DC

## 2023-03-26 MED ORDER — ALBUTEROL SULFATE HFA 108 (90 BASE) MCG/ACT IN AERS
INHALATION_SPRAY | RESPIRATORY_TRACT | Status: DC | PRN
  Administered 2023-03-26: 6 via RESPIRATORY_TRACT

## 2023-03-26 MED ORDER — ONDANSETRON HCL 4 MG/2ML IJ SOLN
INTRAMUSCULAR | Status: DC | PRN
  Administered 2023-03-26: 4 mg via INTRAVENOUS

## 2023-03-26 MED ORDER — DEXAMETHASONE SODIUM PHOSPHATE 10 MG/ML IJ SOLN
INTRAMUSCULAR | Status: DC | PRN
  Administered 2023-03-26: 10 mg via INTRAVENOUS

## 2023-03-26 MED ORDER — ACETAMINOPHEN 500 MG PO TABS
1000.0000 mg | ORAL_TABLET | Freq: Once | ORAL | Status: AC
  Administered 2023-03-26: 1000 mg via ORAL
  Filled 2023-03-26: qty 2

## 2023-03-26 MED ORDER — OXYCODONE HCL 5 MG/5ML PO SOLN: 5.0000 mg | Freq: Once | ORAL | Status: DC | PRN

## 2023-03-26 MED ORDER — SODIUM CHLORIDE 0.9 % IR SOLN
Status: DC | PRN
  Administered 2023-03-26 (×3): 3000 mL

## 2023-03-26 MED ORDER — HYDROMORPHONE HCL 1 MG/ML IJ SOLN: 0.2500 mg | INTRAMUSCULAR | Status: DC | PRN

## 2023-03-26 MED ORDER — PROPOFOL 10 MG/ML IV BOLUS
INTRAVENOUS | Status: AC
  Filled 2023-03-26: qty 20

## 2023-03-26 MED ORDER — MIDAZOLAM HCL 2 MG/2ML IJ SOLN
INTRAMUSCULAR | Status: AC
  Filled 2023-03-26: qty 2

## 2023-03-26 MED ORDER — MIDAZOLAM HCL 2 MG/2ML IJ SOLN
INTRAMUSCULAR | Status: DC | PRN
  Administered 2023-03-26: 2 mg via INTRAVENOUS

## 2023-03-26 MED ORDER — FENTANYL CITRATE (PF) 250 MCG/5ML IJ SOLN
INTRAMUSCULAR | Status: AC
  Filled 2023-03-26: qty 5

## 2023-03-26 MED ORDER — LIDOCAINE 2% (20 MG/ML) 5 ML SYRINGE
INTRAMUSCULAR | Status: AC
  Filled 2023-03-26: qty 5

## 2023-03-26 MED ORDER — ESMOLOL HCL 100 MG/10ML IV SOLN
INTRAVENOUS | Status: DC | PRN
  Administered 2023-03-26: 10 mg via INTRAVENOUS

## 2023-03-26 MED ORDER — MEPERIDINE HCL 25 MG/ML IJ SOLN: 6.2500 mg | INTRAMUSCULAR | Status: DC | PRN

## 2023-03-26 MED ORDER — CHLORHEXIDINE GLUCONATE 0.12 % MT SOLN
15.0000 mL | Freq: Once | OROMUCOSAL | Status: AC
  Administered 2023-03-26: 15 mL via OROMUCOSAL
  Filled 2023-03-26: qty 15

## 2023-03-26 MED ORDER — PROPOFOL 10 MG/ML IV BOLUS
INTRAVENOUS | Status: DC | PRN
  Administered 2023-03-26: 160 mg via INTRAVENOUS

## 2023-03-26 MED ORDER — PHENYLEPHRINE 80 MCG/ML (10ML) SYRINGE FOR IV PUSH (FOR BLOOD PRESSURE SUPPORT)
PREFILLED_SYRINGE | INTRAVENOUS | Status: DC | PRN
  Administered 2023-03-26: 160 ug via INTRAVENOUS
  Administered 2023-03-26: 80 ug via INTRAVENOUS
  Administered 2023-03-26: 160 ug via INTRAVENOUS

## 2023-03-26 MED ORDER — KETOROLAC TROMETHAMINE 30 MG/ML IJ SOLN: 30.0000 mg | Freq: Once | INTRAMUSCULAR | Status: DC | PRN

## 2023-03-26 MED ORDER — AMISULPRIDE (ANTIEMETIC) 5 MG/2ML IV SOLN: 10.0000 mg | Freq: Once | INTRAVENOUS | Status: DC | PRN

## 2023-03-26 MED ORDER — OXYCODONE HCL 5 MG PO TABS: 5.0000 mg | ORAL_TABLET | Freq: Once | ORAL | Status: DC | PRN

## 2023-03-26 MED ORDER — BUPIVACAINE HCL (PF) 0.5 % IJ SOLN
INTRAMUSCULAR | Status: DC | PRN
  Administered 2023-03-26: 10 mL

## 2023-03-26 MED ORDER — ONDANSETRON HCL 4 MG/2ML IJ SOLN: 4.0000 mg | Freq: Once | INTRAMUSCULAR | Status: DC | PRN

## 2023-03-26 MED ORDER — FENTANYL CITRATE (PF) 250 MCG/5ML IJ SOLN
INTRAMUSCULAR | Status: DC | PRN
  Administered 2023-03-26 (×3): 50 ug via INTRAVENOUS

## 2023-03-26 MED ORDER — ORAL CARE MOUTH RINSE: 15.0000 mL | Freq: Once | OROMUCOSAL | Status: AC

## 2023-03-26 SURGICAL SUPPLY — 17 items
CATH ROBINSON RED A/P 16FR (CATHETERS) IMPLANT
DEVICE MYOSURE LITE (MISCELLANEOUS) IMPLANT
DEVICE MYOSURE REACH (MISCELLANEOUS) IMPLANT
GAUZE 4X4 16PLY ~~LOC~~+RFID DBL (SPONGE) ×1 IMPLANT
GLOVE BIO SURGEON STRL SZ7 (GLOVE) ×1 IMPLANT
GLOVE BIOGEL PI IND STRL 7.0 (GLOVE) IMPLANT
GLOVE ECLIPSE 7.0 STRL STRAW (GLOVE) IMPLANT
GOWN STRL REUS W/TWL LRG LVL3 (GOWN DISPOSABLE) ×1 IMPLANT
GOWN STRL REUS W/TWL XL LVL3 (GOWN DISPOSABLE) ×1 IMPLANT
KIT PROCEDURE FLUENT (KITS) ×1 IMPLANT
KIT TURNOVER KIT A (KITS) ×1 IMPLANT
MYOSURE XL FIBROID (MISCELLANEOUS)
PACK VAGINAL MINOR WOMEN LF (CUSTOM PROCEDURE TRAY) ×1 IMPLANT
PAD OB MATERNITY 4.3X12.25 (PERSONAL CARE ITEMS) ×1 IMPLANT
SEAL CERVICAL OMNI LOK (ABLATOR) IMPLANT
SEAL ROD LENS SCOPE MYOSURE (ABLATOR) ×1 IMPLANT
SYSTEM TISS REMOVAL MYOSURE XL (MISCELLANEOUS) IMPLANT

## 2023-03-26 NOTE — Anesthesia Procedure Notes (Deleted)
Procedure Name: Intubation Date/Time: 03/26/2023 11:32 AM  Performed by: Caren Macadam, CRNAPre-anesthesia Checklist: Patient identified, Patient being monitored, Timeout performed, Emergency Drugs available and Suction available Patient Re-evaluated:Patient Re-evaluated prior to induction Oxygen Delivery Method: Circle system utilized Preoxygenation: Pre-oxygenation with 100% oxygen Induction Type: IV induction Ventilation: Mask ventilation without difficulty Laryngoscope Size: Miller and 2 Grade View: Grade II Tube type: Oral Tube size: 7.0 mm Number of attempts: 2 Airway Equipment and Method: Stylet Placement Confirmation: ETT inserted through vocal cords under direct vision, positive ETCO2 and breath sounds checked- equal and bilateral Secured at: 21 cm Tube secured with: Tape Dental Injury: Teeth and Oropharynx as per pre-operative assessment

## 2023-03-26 NOTE — Anesthesia Procedure Notes (Deleted)
Procedure Name: Intubation Date/Time: 03/26/2023 11:32 AM  Performed by: Aundria Rud, CRNAPre-anesthesia Checklist: Patient identified, Emergency Drugs available, Suction available and Patient being monitored Patient Re-evaluated:Patient Re-evaluated prior to induction Oxygen Delivery Method: Circle System Utilized Preoxygenation: Pre-oxygenation with 100% oxygen Induction Type: IV induction Ventilation: Mask ventilation without difficulty Laryngoscope Size: Miller and 2 Grade View: Grade II Tube type: Oral Number of attempts: 1 Airway Equipment and Method: Stylet and Oral airway Placement Confirmation: ETT inserted through vocal cords under direct vision, positive ETCO2 and breath sounds checked- equal and bilateral Tube secured with: Tape Dental Injury: Teeth and Oropharynx as per pre-operative assessment

## 2023-03-26 NOTE — Anesthesia Preprocedure Evaluation (Addendum)
Anesthesia Evaluation  Patient identified by MRN, date of birth, ID band Patient awake    Reviewed: Allergy & Precautions, H&P , NPO status , Patient's Chart, lab work & pertinent test results  History of Anesthesia Complications Negative for: history of anesthetic complications  Airway Mallampati: III  TM Distance: >3 FB Neck ROM: Full    Dental  (+) Chipped, Dental Advisory Given, Poor Dentition,    Pulmonary neg pulmonary ROS   breath sounds clear to auscultation       Cardiovascular negative cardio ROS  Rhythm:Regular Rate:Normal     Neuro/Psych negative neurological ROS  negative psych ROS   GI/Hepatic negative GI ROS, Neg liver ROS,,,  Endo/Other  Obesity BMI 37  Renal/GU negative Renal ROS     Musculoskeletal negative musculoskeletal ROS (+)    Abdominal  (+) + obese  Peds  Hematology negative hematology ROS (+)   Anesthesia Other Findings Hx L breast ca   Reproductive/Obstetrics Lab Results      Component                Value               Date                      PREGTESTUR               NEGATIVE            03/26/2023                PREGSERUM                NEGATIVE            11/01/2009                                        Anesthesia Physical Anesthesia Plan  ASA: 2  Anesthesia Plan: General   Post-op Pain Management: Tylenol PO (pre-op)* and Toradol IV (intra-op)*   Induction: Intravenous  PONV Risk Score and Plan: Ondansetron, Dexamethasone, Midazolam and Treatment may vary due to age or medical condition  Airway Management Planned: LMA  Additional Equipment: None  Intra-op Plan:   Post-operative Plan: Extubation in OR  Informed Consent: I have reviewed the patients History and Physical, chart, labs and discussed the procedure including the risks, benefits and alternatives for the proposed anesthesia with the patient or authorized representative who has  indicated his/her understanding and acceptance.     Dental advisory given  Plan Discussed with: CRNA  Anesthesia Plan Comments:         Anesthesia Quick Evaluation

## 2023-03-26 NOTE — Transfer of Care (Signed)
Immediate Anesthesia Transfer of Care Note  Patient: Aimee Poole  Procedure(s) Performed: DILATATION AND CURETTAGE /HYSTEROSCOPY WITH MYOSURE (Uterus)  Patient Location: PACU  Anesthesia Type:General  Level of Consciousness: awake, alert , and drowsy  Airway & Oxygen Therapy: Patient Spontanous Breathing and Patient connected to face mask oxygen  Post-op Assessment: Report given to RN, Post -op Vital signs reviewed and stable, and Patient moving all extremities X 4  Post vital signs: Reviewed and stable  Last Vitals:  Vitals Value Taken Time  BP 133/72 03/26/23 1235  Temp 36.4 C 03/26/23 1235  Pulse 103 03/26/23 1236  Resp 18 03/26/23 1236  SpO2 96 % 03/26/23 1236  Vitals shown include unvalidated device data.  Last Pain:  Vitals:   03/26/23 1023  TempSrc:   PainSc: 0-No pain         Complications: No notable events documented.

## 2023-03-26 NOTE — Discharge Instructions (Signed)
For the first 1-2 days, take ibuprofen every 6 hours, regardless of how you feel.  You will have an appointment in 2-3 weeks

## 2023-03-26 NOTE — Anesthesia Procedure Notes (Addendum)
Procedure Name: Intubation Date/Time: 03/26/2023 11:32 AM  Performed by: Aundria Rud, CRNAPre-anesthesia Checklist: Patient identified, Emergency Drugs available, Suction available and Patient being monitored Patient Re-evaluated:Patient Re-evaluated prior to induction Oxygen Delivery Method: Circle System Utilized Preoxygenation: Pre-oxygenation with 100% oxygen Induction Type: IV induction Ventilation: Mask ventilation without difficulty Laryngoscope Size: Miller and 2 Grade View: Grade I Tube type: Oral Tube size: 7.0 mm Number of attempts: 1 Airway Equipment and Method: Stylet and Oral airway Placement Confirmation: ETT inserted through vocal cords under direct vision, positive ETCO2 and breath sounds checked- equal and bilateral Secured at: 21 cm Tube secured with: Tape Dental Injury: Teeth and Oropharynx as per pre-operative assessment  Comments: CRNA April Carter converted to ETT due to patient no longer achieving TV with LMA. Grade 2 view with miller 2 blade. Dr. Maple Hudson with grade 1 view with miller 2 and placed ETT.

## 2023-03-26 NOTE — Brief Op Note (Signed)
03/26/2023  12:42 PM  PATIENT:  Aimee Poole  52 y.o. female  PRE-OPERATIVE DIAGNOSIS:  PERI-MENOPAUSAL BLEEDING  POST-OPERATIVE DIAGNOSIS:  PERI-MENOPAUSAL BLEEDING  PROCEDURE:  Procedure(s): DILATATION AND CURETTAGE /HYSTEROSCOPY WITH MYOSURE (N/A)  SURGEON:  Surgeon(s) and Role:    Lorriane Shire, MD - Primary  PHYSICIAN ASSISTANT: n/a  ASSISTANTS: none   ANESTHESIA:   general and paracervical block  EBL:  20 mL   BLOOD ADMINISTERED:none  DRAINS: none   LOCAL MEDICATIONS USED:  BUPIVICAINE   SPECIMEN:  Source of Specimen:  uterine polyp and endocervical curettings  DISPOSITION OF SPECIMEN:  PATHOLOGY  COUNTS:  YES  TOURNIQUET:  * No tourniquets in log *  DICTATION: .Note written in EPIC  PLAN OF CARE: Discharge to home after PACU  PATIENT DISPOSITION:  PACU - hemodynamically stable.   Delay start of Pharmacological VTE agent (>24hrs) due to surgical blood loss or risk of bleeding: not applicable

## 2023-03-26 NOTE — Op Note (Signed)
Aimee Poole PROCEDURE DATE: 03/26/2023  PREOPERATIVE DIAGNOSIS: postmenopausal bleeding POSTOPERATIVE DIAGNOSIS: postmenopausal bleeding PROCEDURE:  operative hysteroscopy, dilation and curettage SURGEON: Lorriane Shire, MD ASSISTANT:  None  INDICATIONS: 52 y.o. with postmenopausal bleeding.  Risks of surgery were discussed with the patient including but not limited to: bleeding which may require transfusion; infection which may require antibiotics; injury to surrounding organs; need for additional procedures including laparotomy;  and other postoperative/anesthesia complications. Written informed consent was obtained.    FINDINGS:  Normal external genitalia, 12 wk size mobile/immobile uterus with Normal contours.  Hysteroscope: large mass occupying most of the cavity with attachment to anterior fundus, small polyp in left cornua, bilateral tubal ostia visualized   ANESTHESIA: General, paracervical block ESTIMATED BLOOD LOSS:  20 ml URINE OUTPUT: 25 ml SPECIMENS: uterine polyp/mass and endometrial curettings COMPLICATIONS:  None immediate.   PROCEDURE: The patient was taken to the operating room where spinal analgesia was inserted. SCDs were in place.  Time out was performed. Patient was placed in dorsolithotomy in [place] stirrups. She was prepped and draped in the usual sterile fashion. A Red Rubber catheter was used to drain her bladder. A speculum was placed in the vagina. The cervix was visualized anteriorly and grasped with a single-tooth tenaculum. Paracervical block was performed with 0.5% bupivicaine with 10 cc injected. The uterus sounded to 10 cm. Sequential dilation was performed with Shawnie Pons dilators. The hysteroscope was inserted and the endometrial cavity and inspected. The above findings were noted. The MyoSure reach was used to remove the polyp, debulk the large mass/possible fibroid and detach from the endometrium. The hysteroscope was removed and forceps were used to remove  the large remaining specimen. The hysteroscope was introduced into the cavity and the endometrium further curetted and no additional lesions noted. The hysteroscope was removed. Sharp curettage was performed in all 4 quadrants. All instruments were removed from the vagina. All instrument, needle and lap counts were correct x2. The patient was awakened and is recovering in stable condition.   Lorriane Shire, MD Minimally Invasive Gynecologic Surgery and Chronic Pelvic Pain Specialist Obstetrics and Gynecology, [facility] for [facility], [facility] 03/26/2023

## 2023-03-26 NOTE — H&P (Signed)
OB/GYN Pre-Op History and Physical  Aimee Poole is a 52 y.o. No obstetric history on file. presenting for postmenopausal bleeding.       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 distal tube   INCISIONAL HERNIA REPAIR  01/23/2010   MASTECTOMY Left 11/03/2009   TRAM 2011   PORTACATH PLACEMENT     RECONSTRUCTION BREAST W/ TRAM FLAP Left 01/23/2010   REDUCTION MAMMAPLASTY Right    REVISION RECONSTRUCTED BREAST Left 05/30/2010   UTERINE FIBROID SURGERY     VASCULAR DELAY PRE-TRAM Left 11/03/2009    OB History  No obstetric history on file.    Social History   Socioeconomic History   Marital status: Married    Spouse name: Not on file   Number of children: Not on file   Years of education: Not on file   Highest education level: Not on file  Occupational History   Not on file  Tobacco Use   Smoking status: Never   Smokeless tobacco: Never  Vaping Use   Vaping Use: Never used  Substance and Sexual Activity   Alcohol use: No   Drug use: No   Sexual activity: Not on file    Comment: menarche age 34, P34, still menstruating at age 2  Other  Topics Concern   Not on file  Social History Narrative   Not on file   Social Determinants of Health   Financial Resource Strain: Not on file  Food Insecurity: Food Insecurity Present (01/21/2023)   Hunger Vital Sign    Worried About Running Out of Food in the Last Year: Sometimes true    Ran Out of Food in the Last Year: Sometimes true  Transportation Needs: No Transportation Needs (01/21/2023)   PRAPARE - Administrator, Civil Service (Medical): No    Lack of Transportation (Non-Medical): No  Physical Activity: Not on file  Stress: Not on file  Social Connections: Not on file    Family History  Problem Relation Age of Onset   Colon cancer Maternal Uncle    Breast cancer Cousin    Breast cancer Maternal Aunt 67       currently 53    Medications Prior to Admission  Medication Sig Dispense Refill Last Dose   ibuprofen (ADVIL) 200 MG tablet Take 400 mg by mouth every 6 (six) hours as needed for headache.   Past Week   Multiple Vitamins-Minerals (CENTRUM MULTIGUMMIES PO) Take by mouth.   Past Week   tamoxifen (NOLVADEX) 20 MG tablet TAKE 1 TABLET BY MOUTH  DAILY (Patient  not taking: Reported on 03/17/2023) 90 tablet 3 Not Taking    Allergies  Allergen Reactions   Corn-Containing Products Hives    Review of Systems: Negative except for what is mentioned in HPI.     Physical Exam: BP (!) 141/77   Pulse 97   Temp 98 F (36.7 C) (Oral)   Resp 18   Ht 5\' 1"  (1.549 m)   Wt 88.5 kg   LMP  (LMP Unknown) Comment: peri-menopausal bleeding  SpO2 99%   BMI 36.84 kg/m  CONSTITUTIONAL: Well-developed, well-nourished female in no acute distress.  HENT:  Normocephalic, atraumatic, External right and left ear normal. Oropharynx is clear and moist EYES: Conjunctivae and EOM are normal. Pupils are equal, round, and reactive to light. No scleral icterus.  NECK: Normal range of motion, supple, no masses SKIN: Skin is warm and dry. No rash noted. Not diaphoretic. No  erythema. No pallor. NEUROLGIC: Alert and oriented to person, place, and time. Normal reflexes, muscle tone coordination. No cranial nerve deficit noted. PSYCHIATRIC: Normal mood and affect. Normal behavior. Normal judgment and thought content. RESPIRATORY: Effort normal  PELVIC: Deferred MUSCULOSKELETAL: Normal range of motion. No edema and no tenderness. 2+ distal pulses.   Pertinent Labs/Studies:   Results for orders placed or performed during the hospital encounter of 03/26/23 (from the past 72 hour(s))  Pregnancy, urine POC     Status: None   Collection Time: 03/26/23 10:22 AM  Result Value Ref Range   Preg Test, Ur NEGATIVE NEGATIVE    Comment:        THE SENSITIVITY OF THIS METHODOLOGY IS >24 mIU/mL        Assessment and Plan :Aimee Poole is a 52 y.o. here for postmenopausal bleeding.   Patient desires surgical management with operative hysteroscopy.  The risks of surgery were discussed in detail with the patient including but not limited to: bleeding which may require transfusion or reoperation; infection which may require prolonged hospitalization or re-hospitalization and antibiotic therapy; injury to bowel, bladder, ureters and major vessels or other surrounding organs which may lead to other procedures; formation of adhesions; need for additional procedures including laparotomy or subsequent procedures secondary to intraoperative injury or abnormal pathology; thromboembolic phenomenon; incisional problems and other postoperative or anesthesia complications.  The postoperative expectations were also discussed in detail. The patient also understands the alternative treatment options which were discussed in full. All questions were answered.    Lorriane Shire, M.D. Minimally Invasive Gynecologic Surgery and Pelvic Pain Specialist Attending Obstetrician & Gynecologist, [facility] [employer], [facility]

## 2023-03-27 ENCOUNTER — Encounter (HOSPITAL_COMMUNITY): Payer: Self-pay | Admitting: Obstetrics and Gynecology

## 2023-03-27 LAB — SURGICAL PATHOLOGY

## 2023-03-27 NOTE — Anesthesia Postprocedure Evaluation (Signed)
Anesthesia Post Note  Patient: Shavonn A Macauley  Procedure(s) Performed: DILATATION AND CURETTAGE /HYSTEROSCOPY WITH MYOSURE (Uterus)     Patient location during evaluation: PACU Anesthesia Type: General Level of consciousness: awake and alert Pain management: pain level controlled Vital Signs Assessment: post-procedure vital signs reviewed and stable Respiratory status: spontaneous breathing, nonlabored ventilation and respiratory function stable Cardiovascular status: blood pressure returned to baseline and stable Postop Assessment: no apparent nausea or vomiting Anesthetic complications: no   No notable events documented.  Last Vitals:  Vitals:   03/26/23 1245 03/26/23 1300  BP: 138/76 (!) 139/91  Pulse: (!) 102 96  Resp: 18 14  Temp:  36.4 C  SpO2: 95% 92%    Last Pain:  Vitals:   03/26/23 1300  TempSrc:   PainSc: 0-No pain                 Pia Jedlicka

## 2023-04-17 ENCOUNTER — Ambulatory Visit (INDEPENDENT_AMBULATORY_CARE_PROVIDER_SITE_OTHER): Payer: Commercial Managed Care - PPO | Admitting: Obstetrics and Gynecology

## 2023-04-17 ENCOUNTER — Encounter: Payer: Self-pay | Admitting: Obstetrics and Gynecology

## 2023-04-17 VITALS — BP 143/84 | HR 97 | Wt 192.5 lb

## 2023-04-17 DIAGNOSIS — Z09 Encounter for follow-up examination after completed treatment for conditions other than malignant neoplasm: Secondary | ICD-10-CM

## 2023-04-17 DIAGNOSIS — R32 Unspecified urinary incontinence: Secondary | ICD-10-CM

## 2023-04-17 NOTE — Progress Notes (Signed)
   POSTOPERATIVE VISIT NOTE   Subjective:     Aimee Poole is a 51 y.o. No obstetric history on file. who presents to the clinic 3 weeks status post operative hysteroscopy for abnormal uterine bleeding. Eating a regular diet without difficulty. Bowel movements are normal. The patient is not having any pain. Incision: n/a Vaginal bleeding: resolved  Having urinary issues. Will have urge to void and then unable to hold urine while walking to the bathroom. Denies loss with laugh/sneeze/cough but also admits hasn't really done those activities in some time. No complete loss of urine with sudden urge. Has previously been diagnosed with UTI. No blood in urine.   Will wait until later time to discuss possible hysterectomy.   Per problem list. 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 clinical stage ER 7% PR 0% HER-2 positive ratio 3.84 in the reconstructed left breast   The following portions of the patient's history were reviewed and updated as appropriate: allergies, current medications, past family history, past medical history, past social history, past surgical history, and problem list..   Review of Systems Pertinent items are noted in HPI.    Objective:    LMP  (LMP Unknown) Comment: peri-menopausal bleeding; DOS UPREG NEGATIVE General:  alert and cooperative  Pelvic:   Exam deferred.    Pathology Results: FINAL MICROSCOPIC DIAGNOSIS:   A. ENDOMETRIUM, CURETTAGE WITH POLYPECTOMY:  Multiple fragments of benign endometrial polyp with focal metaplastic  changes and focal hyperplasia without atypia (hyperplastic polyps).  Fragments of inactive endometrium.  Negative for endometrial intraepithelial neoplasia (EIN) or malignancy.    Assessment:   Doing well postoperatively. Operative findings again reviewed. Pathology report discussed.   1. Urinary incontinence, unspecified type - Urine Culture  2. Postop check Benign pathology from hysteroscpoy      Plan:   1. Continue any current medications. 2. Urine culture to rule out infectious cause of urinary incontinence. Hx sounds consistent with SUI. Offered PFPT - states not able to take more time off of work at this time and will wait. Also mentioned possible role of pessary but declines at this time 3. Activity restrictions: none 4. Hx of ER+ breast cancer and benign pathology from procedure. Anticipate bleeding should improve but also reasonable to consider hysterectomy given bleeding and dx of breast cancer. Hysterectomy would include oophorectomy as well. Patient would like to wait for now before undergoing another surgery.    Lorriane Shire, MD Obstetrician & Gynecologist, [facility] [employer], [facility]

## 2023-04-19 LAB — URINE CULTURE

## 2023-04-22 ENCOUNTER — Other Ambulatory Visit: Payer: Self-pay | Admitting: Obstetrics and Gynecology

## 2023-04-22 DIAGNOSIS — N3000 Acute cystitis without hematuria: Secondary | ICD-10-CM

## 2023-04-22 MED ORDER — AMOXICILLIN 500 MG PO CAPS
500.0000 mg | ORAL_CAPSULE | Freq: Three times a day (TID) | ORAL | 0 refills | Status: AC
Start: 2023-04-22 — End: 2023-04-27

## 2023-06-15 NOTE — Progress Notes (Signed)
HEMATOLOGY-ONCOLOGY TELEPHONE VISIT PROGRESS NOTE  I connected with our patient on 06/17/23 at  8:00 AM EDT by telephone and verified that I am speaking with the correct person using two identifiers.  I discussed the limitations, risks, security and privacy concerns of performing an evaluation and management service by telephone and the availability of in person appointments.  I also discussed with the patient that there may be a patient responsible charge related to this service. The patient expressed understanding and agreed to proceed.   History of Present Illness: Aimee Poole is a 52 y.o. with above-mentioned history of left breast cancer treated with neoadjuvant chemotherapy, lumpectomy, radiation, and who is currently on tamoxifen. She presents to the clinic for a telephone follow-up to discuss the overall plan with tamoxifen.  Patient tells me that she continues to have heavy menstrual bleeding in spite of recent D&C.  Today she feels she is coming down with upper respiratory infection with cough chills fevers and she tested negative for COVID.  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 The genes tested were ATM, BARD1, BRCA1, BRCA2, BRIP1, CDH1, CHEK2, MRE11A, MUTYH, NBN, NF1, PALB2, PTEN, RAD50, RAD51C, RAD51D, and TP53.    01/30/2015 Breast MRI   Left breast: Masses decrease in size from 4 cm to 2.8 cm abuts the pectoralis muscle   02/07/2015 -  Chemotherapy   Herceptin maintenance therapy to complete November 2016   02/09/2015 Surgery   Left breast lumpectomy: Pathologic complete response   03/13/2015 - 05/02/2015 Radiation Therapy   Adjuvant radiation therapy   05/30/2015 -  Anti-estrogen oral therapy   Tamoxifen 20 mg daily     REVIEW OF SYSTEMS:   Constitutional: Denies fevers, chills or abnormal weight loss All other systems were reviewed with the patient and are negative. 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 clinical stage ER 7% PR 0% HER-2 positive ratio 3.84 in the reconstructed left breast. Completed 6 cycles of of TCHP, currently on Herceptin maintenance that will complete November 2016   Post neoadjuvant MRI: Decrease in size of the mass from 4 cm to 2.8 cm; Final pathology counseling:  pathologic complete response based on lumpectomy March 2016   Completed radiation thearpy in June 2016 Herceptin maintenance completed Nov 7th 2016 (ECHO 9/16: EF 55-60%) Started tamoxifen June 2016   Current treatment: Tamoxifen daily started June 2016 completed April 2024   Tamoxifen Toxicities:  Intermittent hot flashes. Denies any arthralgias or myalgias    Breast pain: Due to scar tissue from radiation.   Breast cancer surveillance:   Mammogram 10/29/2022: Benign breast density category B  MRI pelvis 03/13/2023: Expansile contrast-enhancing mass endometrial cavity 8.3  cm consistent with endometrial malignancy.  Left ovary cyst 5.4 cm 03/26/2023: Hysteroscopy and D&C: Benign endometrial polyp with focal metaplastic changes and hyperplasia without atypia  Tamoxifen discussion: Patient took tamoxifen for 8 years.  Given these uterine changes and persistence of  heavy uterine bleeding in spite of the D&C, I recommended discontinuation of therapy at this time.  Upper respiratory infection: Sent prescription for amoxicillin. Continue with surveillance and follow-up with me next year in April.  After that she could be seen on an as-needed basis   I discussed the assessment and treatment plan with the patient. The patient was provided an opportunity to ask questions and all were answered. The patient agreed with the plan and demonstrated an understanding of the instructions. The patient was advised to call back or seek an in-person evaluation if the symptoms worsen or if the condition fails to improve as anticipated.   I provided 12 minutes of non-face-to-face time during this encounter.  This includes time for charting and coordination of care   Tamsen Meek, MD  I Janan Ridge am acting as a scribe for Dr.Zyire Eidson  I have reviewed the above documentation for accuracy and completeness, and I agree with the above.

## 2023-06-17 ENCOUNTER — Inpatient Hospital Stay: Payer: Commercial Managed Care - PPO | Attending: Hematology and Oncology | Admitting: Hematology and Oncology

## 2023-06-17 DIAGNOSIS — C50412 Malignant neoplasm of upper-outer quadrant of left female breast: Secondary | ICD-10-CM

## 2023-06-17 DIAGNOSIS — Z170 Estrogen receptor positive status [ER+]: Secondary | ICD-10-CM

## 2023-06-17 MED ORDER — AMOXICILLIN 500 MG PO CAPS: 500.0000 mg | ORAL_CAPSULE | Freq: Two times a day (BID) | ORAL | 0 refills | Status: DC

## 2023-06-17 NOTE — Assessment & Plan Note (Signed)
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 clinical stage ER 7% PR 0% HER-2 positive ratio 3.84 in the reconstructed left breast. Completed 6 cycles of of TCHP, currently on Herceptin maintenance that will complete November 2016   Post neoadjuvant MRI: Decrease in size of the mass from 4 cm to 2.8 cm; Final pathology counseling:  pathologic complete response based on lumpectomy March 2016   Completed radiation thearpy in June 2016 Herceptin maintenance completed Nov 7th 2016 (ECHO 9/16: EF 55-60%) Started tamoxifen June 2016   Current treatment: Tamoxifen daily started June 2016   Tamoxifen Toxicities:  Intermittent hot flashes. Denies any arthralgias or myalgias    Breast pain: Due to scar tissue from radiation.   Breast cancer surveillance:   Mammogram 10/29/2022: Benign breast density category B   Neuropathy:  MRI pelvis 03/13/2023: Expansile contrast-enhancing mass endometrial cavity 8.3 cm consistent with endometrial malignancy.  Left ovary cyst 5.4 cm 03/26/2023: Hysteroscopy and D&C: Benign endometrial polyp with focal metaplastic changes and hyperplasia without atypia  Tamoxifen discussion: Patient took tamoxifen for 8 years.  Given these uterine changes, I recommended discontinuation of therapy at this time.  Continue with surveillance and follow-up with long-term survivorship clinic once a year.

## 2023-09-23 ENCOUNTER — Other Ambulatory Visit: Payer: Self-pay | Admitting: General Surgery

## 2023-09-23 DIAGNOSIS — Z1231 Encounter for screening mammogram for malignant neoplasm of breast: Secondary | ICD-10-CM

## 2023-10-31 ENCOUNTER — Ambulatory Visit: Payer: Commercial Managed Care - PPO

## 2023-11-12 ENCOUNTER — Ambulatory Visit
Admission: RE | Admit: 2023-11-12 | Discharge: 2023-11-12 | Disposition: A | Discharge status: Home / Self Care | Payer: Commercial Managed Care - PPO | Source: Ambulatory Visit | Attending: General Surgery | Admitting: General Surgery

## 2023-11-12 DIAGNOSIS — Z1231 Encounter for screening mammogram for malignant neoplasm of breast: Secondary | ICD-10-CM

## 2024-03-16 ENCOUNTER — Inpatient Hospital Stay: Payer: Commercial Managed Care - PPO | Attending: Hematology and Oncology | Admitting: Hematology and Oncology

## 2024-03-16 ENCOUNTER — Inpatient Hospital Stay

## 2024-03-16 VITALS — BP 152/86 | HR 101 | Temp 97.3°F | Resp 18 | Ht 61.0 in | Wt 200.5 lb

## 2024-03-16 DIAGNOSIS — C50412 Malignant neoplasm of upper-outer quadrant of left female breast: Secondary | ICD-10-CM

## 2024-03-16 DIAGNOSIS — I10 Essential (primary) hypertension: Secondary | ICD-10-CM | POA: Diagnosis not present

## 2024-03-16 DIAGNOSIS — Z170 Estrogen receptor positive status [ER+]: Secondary | ICD-10-CM | POA: Diagnosis not present

## 2024-03-16 DIAGNOSIS — J069 Acute upper respiratory infection, unspecified: Secondary | ICD-10-CM | POA: Diagnosis not present

## 2024-03-16 DIAGNOSIS — Z923 Personal history of irradiation: Secondary | ICD-10-CM | POA: Diagnosis not present

## 2024-03-16 DIAGNOSIS — R140 Abdominal distension (gaseous): Secondary | ICD-10-CM | POA: Diagnosis not present

## 2024-03-16 DIAGNOSIS — R42 Dizziness and giddiness: Secondary | ICD-10-CM | POA: Diagnosis not present

## 2024-03-16 LAB — CBC WITH DIFFERENTIAL (CANCER CENTER ONLY)
Abs Immature Granulocytes: 0.01 10*3/uL (ref 0.00–0.07)
Basophils Absolute: 0.1 10*3/uL (ref 0.0–0.1)
Basophils Relative: 1 %
Eosinophils Absolute: 0.2 10*3/uL (ref 0.0–0.5)
Eosinophils Relative: 5 %
HCT: 37.9 % (ref 36.0–46.0)
Hemoglobin: 12.7 g/dL (ref 12.0–15.0)
Immature Granulocytes: 0 %
Lymphocytes Relative: 38 %
Lymphs Abs: 1.3 10*3/uL (ref 0.7–4.0)
MCH: 27.9 pg (ref 26.0–34.0)
MCHC: 33.5 g/dL (ref 30.0–36.0)
MCV: 83.3 fL (ref 80.0–100.0)
Monocytes Absolute: 0.5 10*3/uL (ref 0.1–1.0)
Monocytes Relative: 16 %
Neutro Abs: 1.4 10*3/uL — ABNORMAL LOW (ref 1.7–7.7)
Neutrophils Relative %: 40 %
Platelet Count: 246 10*3/uL (ref 150–400)
RBC: 4.55 MIL/uL (ref 3.87–5.11)
RDW: 12.6 % (ref 11.5–15.5)
WBC Count: 3.5 10*3/uL — ABNORMAL LOW (ref 4.0–10.5)
nRBC: 0 % (ref 0.0–0.2)

## 2024-03-16 LAB — CMP (CANCER CENTER ONLY)
ALT: 9 U/L (ref 0–44)
AST: 17 U/L (ref 15–41)
Albumin: 4.1 g/dL (ref 3.5–5.0)
Alkaline Phosphatase: 112 U/L (ref 38–126)
Anion gap: 6 (ref 5–15)
BUN: 9 mg/dL (ref 6–20)
CO2: 26 mmol/L (ref 22–32)
Calcium: 8.9 mg/dL (ref 8.9–10.3)
Chloride: 106 mmol/L (ref 98–111)
Creatinine: 0.82 mg/dL (ref 0.44–1.00)
GFR, Estimated: >60 mL/min (ref >60)
Glucose, Bld: 103 mg/dL — ABNORMAL HIGH (ref 70–99)
Potassium: 3.9 mmol/L (ref 3.5–5.1)
Sodium: 138 mmol/L (ref 135–145)
Total Bilirubin: 0.3 mg/dL (ref 0.0–1.2)
Total Protein: 7.4 g/dL (ref 6.5–8.1)

## 2024-03-16 MED ORDER — AZITHROMYCIN 250 MG PO TABS: ORAL_TABLET | ORAL | 0 refills | Status: DC

## 2024-03-16 NOTE — Assessment & Plan Note (Signed)
 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 clinical stage ER 7% PR 0% HER-2 positive ratio 3.84 in the reconstructed left breast. Completed 6 cycles of of TCHP, currently on Herceptin  maintenance that will complete November 2016   Post neoadjuvant MRI: Decrease in size of the mass from 4 cm to 2.8 cm; Final pathology counseling:  pathologic complete response based on lumpectomy March 2016   Completed radiation thearpy in June 2016 Herceptin  maintenance completed Nov 7th 2016 (ECHO 9/16: EF 55-60%) Started tamoxifen  June 2016   Prior treatment: Tamoxifen  daily started June 2016 completed April 2024 (stopped early because of uterine changes) MRI pelvis 03/13/2023: Expansile contrast-enhancing mass endometrial cavity 8.3 cm consistent with endometrial malignancy.  Left ovary cyst 5.4 cm 03/26/2023: Hysteroscopy and D&C: Benign endometrial polyp with focal metaplastic changes and hyperplasia without atypia  Breast cancer surveillance: Breast exam 03/16/2024: Benign Mammogram 11/14/2023: Benign breast density category A  Return to clinic in 1 year for follow-up

## 2024-03-16 NOTE — Progress Notes (Signed)
 Patient Care Team: Pa, [facility] as PCP - General  DIAGNOSIS:  Encounter Diagnosis  Name Primary?   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 The genes tested were ATM, BARD1, BRCA1, BRCA2, BRIP1, CDH1, CHEK2, MRE11A, MUTYH, NBN, NF1, PALB2, PTEN, RAD50, RAD51C, RAD51D, and TP53.   01/30/2015 Breast MRI   Left breast: Masses decrease in size from 4 cm to 2.8 cm abuts the pectoralis muscle   02/07/2015 -  Chemotherapy   Herceptin  maintenance therapy to complete November 2016   02/09/2015 Surgery   Left breast lumpectomy: Pathologic complete response   03/13/2015 - 05/02/2015 Radiation Therapy   Adjuvant radiation therapy   05/30/2015 -  Anti-estrogen oral therapy   Tamoxifen  20 mg daily     CHIEF COMPLIANT: Surveillance of breast cancer  HISTORY OF PRESENT ILLNESS:  History of Present Illness The patient, with a history of uterine issues and previous tamoxifen  use, presents with a two-week history of dizziness and cold chills. The patient denies fever. The patient also reports heavy menstrual bleeding, with the last cycle occurring a month ago. The patient  describes the bleeding as so heavy that it once ran down her leg. The patient also reports vaginal dryness.  In addition to these symptoms, the patient reports a sensation of something in her throat. The patient also mentions abdominal discomfort, bloating, and increased gas. The patient reports that these symptoms have been ongoing for a long time and have worsened since stopping tamoxifen . The patient denies any recent changes in diet that could explain these symptoms.     ALLERGIES:  is allergic to corn-containing products.  MEDICATIONS:  Current Outpatient Medications  Medication Sig Dispense Refill   amoxicillin  (AMOXIL ) 500 MG capsule Take 1 capsule (500 mg total) by mouth 2 (two) times daily. 14 capsule 0   ibuprofen (ADVIL) 200 MG tablet Take 400 mg by mouth every 6 (six) hours as needed for headache.     Multiple Vitamins-Minerals (CENTRUM MULTIGUMMIES PO) Take by mouth.     tamoxifen  (NOLVADEX ) 20 MG tablet TAKE 1 TABLET BY MOUTH  DAILY (Patient not taking: Reported on 03/17/2023) 90 tablet 3   No current facility-administered medications for this visit.    PHYSICAL EXAMINATION: ECOG PERFORMANCE STATUS: 1 - Symptomatic but completely ambulatory  There were no vitals filed for this visit. There were no vitals filed for this visit.  Physical Exam   (exam performed in the presence of a chaperone)  LABORATORY DATA:  I have reviewed the data as listed    Latest Ref Rng & Units 02/17/2016    5:47 PM 12/04/2015    7:55 AM 09/11/2015   12:51 PM  CMP  Glucose 65 - 99 mg/dL 94  161  88   BUN 6 - 20 mg/dL 9  11  9.1   Creatinine 0.44 - 1.00 mg/dL 0.96  0.45  0.7   Sodium 135 - 145 mmol/L 141  139  142   Potassium 3.5 - 5.1 mmol/L 3.9  4.0  3.6   Chloride 101 - 111 mmol/L 111  108    CO2 22 - 32 mmol/L 22  22  26    Calcium 8.9 - 10.3 mg/dL 40.9  9.3  9.9   Total Protein 6.4 - 8.3 g/dL   7.1   Total Bilirubin 0.20 - 1.20 mg/dL   <8.11   Alkaline Phos 40 - 150 U/L   125   AST  5 - 34 U/L   16   ALT 0 - 55 U/L   11     Lab Results  Component Value Date   WBC 5.9 03/26/2023   HGB 13.0 03/26/2023   HCT 38.6 03/26/2023   MCV 87.1 03/26/2023   PLT 269 03/26/2023   NEUTROABS 3.1 02/17/2016    ASSESSMENT & PLAN:  Breast cancer of upper-outer quadrant of left female breast (HCC) 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 clinical stage ER 7% PR 0% HER-2 positive ratio 3.84 in the reconstructed left breast. Completed 6 cycles of of TCHP, currently on Herceptin  maintenance that will complete November 2016   Post neoadjuvant MRI: Decrease in size of the mass from 4 cm to 2.8 cm; Final pathology counseling:  pathologic complete response based on lumpectomy March 2016   Completed radiation thearpy in June 2016 Herceptin  maintenance completed Nov 7th 2016 (ECHO 9/16: EF 55-60%) Started tamoxifen  June 2016   Prior treatment: Tamoxifen  daily started June 2016 completed April 2024 (stopped early because of uterine changes) MRI pelvis 03/13/2023: Expansile contrast-enhancing mass endometrial cavity 8.3 cm consistent with endometrial malignancy.  Left ovary cyst 5.4 cm 03/26/2023: Hysteroscopy and D&C: Benign endometrial polyp with focal metaplastic changes and hyperplasia without atypia  Breast cancer surveillance: Breast exam 03/16/2024: Benign Mammogram 11/14/2023: Benign breast density category A  Abdominal pain and distention: Will obtain a CT chest abdomen pelvis for further evaluation. Dizziness: With respiratory symptoms: I sent a prescription for azithromycin . Hypertension: She will check it at home. CBC and CMP will be obtained today for evaluation. Telephone visit after the scan to discuss results. If all things are well then we can see her on an as-needed basis.    Assessment & Plan Abdominal bloating and pain Chronic bloating and pain post-tamoxifen  discontinuation. Differential includes gastrointestinal issues or hormonal  changes. Cancer not suspected. - Order full body scan. - Discuss weight loss treatments, including Ozempic, with primary care physician.  Vaginal dryness Vaginal dryness possibly due to hormonal changes post-tamoxifen . No recent gynecological consultation. - Recommend follow-up with gynecologist for management.  Dizziness Dizziness possibly related to hypotension or systemic issues. Blood work needed to assess anemia or electrolyte imbalances. - Order blood work for anemia or electrolyte imbalances.  Upper respiratory infection Symptoms consistent with upper respiratory infection. Low threshold for antibiotics due to cancer treatment history. - Prescribe azithromycin  (Z-Pak).      No orders of the defined types were placed in this encounter.  The patient has a good understanding of the overall plan. she agrees with it. she will call with any problems that may develop before the next visit here. Total time spent: 30 mins including face  to face time and time spent for planning, charting and co-ordination of care   Viinay K Nataleigh Griffin, MD 03/16/24

## 2024-03-17 ENCOUNTER — Telehealth: Payer: Self-pay | Admitting: Hematology and Oncology

## 2024-03-17 NOTE — Telephone Encounter (Signed)
 Confirmed with pt scheduled appt date and time

## 2024-03-23 ENCOUNTER — Ambulatory Visit (HOSPITAL_COMMUNITY)
Admission: RE | Admit: 2024-03-23 | Discharge: 2024-03-23 | Disposition: A | Discharge status: Home / Self Care | Source: Ambulatory Visit | Attending: Hematology and Oncology | Admitting: Hematology and Oncology

## 2024-03-23 DIAGNOSIS — C50412 Malignant neoplasm of upper-outer quadrant of left female breast: Secondary | ICD-10-CM | POA: Diagnosis present

## 2024-03-23 DIAGNOSIS — Z170 Estrogen receptor positive status [ER+]: Secondary | ICD-10-CM | POA: Insufficient documentation

## 2024-03-23 MED ORDER — IOHEXOL 300 MG/ML  SOLN
100.0000 mL | Freq: Once | INTRAMUSCULAR | Status: AC | PRN
  Administered 2024-03-23: 100 mL via INTRAVENOUS

## 2024-04-06 ENCOUNTER — Inpatient Hospital Stay: Attending: Hematology and Oncology | Admitting: Hematology and Oncology

## 2024-04-06 DIAGNOSIS — Z1722 Progesterone receptor negative status: Secondary | ICD-10-CM | POA: Insufficient documentation

## 2024-04-06 DIAGNOSIS — Z170 Estrogen receptor positive status [ER+]: Secondary | ICD-10-CM | POA: Diagnosis not present

## 2024-04-06 DIAGNOSIS — C50412 Malignant neoplasm of upper-outer quadrant of left female breast: Secondary | ICD-10-CM | POA: Diagnosis present

## 2024-04-06 DIAGNOSIS — Z79810 Long term (current) use of selective estrogen receptor modulators (SERMs): Secondary | ICD-10-CM | POA: Insufficient documentation

## 2024-04-06 DIAGNOSIS — Z923 Personal history of irradiation: Secondary | ICD-10-CM | POA: Insufficient documentation

## 2024-04-06 NOTE — Assessment & Plan Note (Signed)
 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 clinical stage ER 7% PR 0% HER-2 positive ratio 3.84 in the reconstructed left breast. Completed 6 cycles of of TCHP, currently on Herceptin  maintenance that will complete November 2016   Post neoadjuvant MRI: Decrease in size of the mass from 4 cm to 2.8 cm; Final pathology counseling:  pathologic complete response based on lumpectomy March 2016   Completed radiation thearpy in June 2016 Herceptin  maintenance completed Nov 7th 2016 (ECHO 9/16: EF 55-60%) Started tamoxifen  June 2016   Prior treatment: Tamoxifen  daily started June 2016 completed April 2024 (stopped early because of uterine changes) MRI pelvis 03/13/2023: Expansile contrast-enhancing mass endometrial cavity 8.3 cm consistent with endometrial malignancy.  Left ovary cyst 5.4 cm 03/26/2023: Hysteroscopy and D&C: Benign endometrial polyp with focal metaplastic changes and hyperplasia without atypia   Breast cancer surveillance: Breast exam 03/16/2024: Benign Mammogram 11/14/2023: Benign breast density category A   Abdominal pain and distention: CT abdomen pelvis: 03/27/2024: Benign (similar soft tissue mass left uterus and left ovarian cyst).   Return to clinic on an as-needed basis

## 2024-04-06 NOTE — Progress Notes (Signed)
 HEMATOLOGY-ONCOLOGY TELEPHONE VISIT PROGRESS NOTE  I connected with our patient on 04/06/24 at 11:45 AM EDT by telephone and verified that I am speaking with the correct person using two identifiers.  I discussed the limitations, risks, security and privacy concerns of performing an evaluation and management service by telephone and the availability of in person appointments.  I also discussed with the patient that there may be a patient responsible charge related to this service. The patient expressed understanding and agreed to proceed.   History of Present Illness:   History of Present Illness Aimee Poole is a 53 year old female who presents for follow-up after a CT scan and blood work.  She experiences tiredness and has a low white blood cell count, which she attributes to a recent respiratory infection treated with azithromycin . She recalls bruising on her arm during the CT scan.  A recent CT scan revealed a left ovarian cyst and a probable uterine fibroid. She has discontinued tamoxifen  and since then, has experienced changes in her menstrual cycle, including a heavy cycle this month after missing one last month. She also reports vaginal dryness.    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 The genes tested were ATM,  BARD1, BRCA1, BRCA2, BRIP1, CDH1, CHEK2, MRE11A, MUTYH, NBN, NF1, PALB2, PTEN, RAD50, RAD51C, RAD51D, and TP53.   01/30/2015 Breast MRI   Left breast: Masses decrease in size from 4 cm to 2.8 cm abuts the pectoralis muscle   02/07/2015 -  Chemotherapy   Herceptin  maintenance therapy to complete November 2016   02/09/2015 Surgery   Left breast lumpectomy: Pathologic complete response   03/13/2015 - 05/02/2015 Radiation Therapy   Adjuvant radiation therapy   05/30/2015 -  Anti-estrogen oral therapy   Tamoxifen  20 mg daily     REVIEW OF SYSTEMS:   Constitutional: Denies fevers, chills or abnormal weight loss All other systems were reviewed with the patient and are negative. 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 clinical stage ER 7% PR 0% HER-2 positive ratio 3.84 in the reconstructed left breast. Completed 6 cycles of of TCHP, currently on Herceptin  maintenance that will complete November 2016   Post neoadjuvant MRI: Decrease in size of the mass from 4 cm to 2.8 cm; Final pathology counseling:  pathologic complete response based on lumpectomy March 2016   Completed radiation thearpy in June 2016 Herceptin  maintenance completed Nov 7th 2016 (ECHO 9/16: EF 55-60%) Started tamoxifen  June 2016- June 2024   Prior treatment: Tamoxifen  daily started June 2016 completed April 2024 (stopped early because of uterine changes) MRI pelvis 03/13/2023: Expansile contrast-enhancing mass endometrial cavity 8.3 cm consistent with endometrial malignancy.  Left  ovary cyst 5.4 cm 03/26/2023: Hysteroscopy and D&C: Benign endometrial polyp with focal metaplastic changes and hyperplasia without atypia   Breast cancer surveillance: Breast exam 03/16/2024: Benign Mammogram 11/14/2023: Benign breast density category A   Abdominal pain and distention: CT abdomen pelvis:  03/27/2024: Benign (similar soft tissue mass left uterus and left ovarian cyst).   Return to clinic on an as-needed basis  Assessment & Plan Malignant neoplasm of left breast No cancer recurrence on CT. Surgical changes without concerning findings. Off tamoxifen . Fatigue reported, but blood work unremarkable. Slightly elevated glucose at 103 mg/dL, non-fasting. Slightly low WBC count, likely due to recent infection and azithromycin .  Upper respiratory infection Recent infection treated with azithromycin . Persistent cough. Slightly low WBC count, likely due to infection or treatment.  Vaginal dryness Vaginal dryness likely due to low estrogen. Discussed safe use of M[place]a. - Refer to gynecologist for management.      I discussed the assessment and treatment plan with the patient. The patient was provided an opportunity to ask questions and all were answered. The patient agreed with the plan and demonstrated an understanding of the instructions. The patient was advised to call back or seek an in-person evaluation if the symptoms worsen or if the condition fails to improve as anticipated.   I provided 20 minutes of non-face-to-face time during this encounter.  This includes time for charting and coordination of care   Margert Sheerer, MD

## 2024-07-09 ENCOUNTER — Other Ambulatory Visit (HOSPITAL_BASED_OUTPATIENT_CLINIC_OR_DEPARTMENT_OTHER): Payer: Self-pay | Admitting: Family Medicine

## 2024-07-09 DIAGNOSIS — E785 Hyperlipidemia, unspecified: Secondary | ICD-10-CM

## 2024-07-23 ENCOUNTER — Ambulatory Visit (HOSPITAL_BASED_OUTPATIENT_CLINIC_OR_DEPARTMENT_OTHER)
Admission: RE | Admit: 2024-07-23 | Discharge: 2024-07-23 | Disposition: A | Discharge status: Home / Self Care | Payer: Self-pay | Source: Ambulatory Visit | Attending: Family Medicine | Admitting: Family Medicine

## 2024-07-23 DIAGNOSIS — E785 Hyperlipidemia, unspecified: Secondary | ICD-10-CM | POA: Insufficient documentation

## 2024-07-24 ENCOUNTER — Other Ambulatory Visit: Payer: Self-pay | Admitting: Medical Genetics

## 2024-09-20 ENCOUNTER — Other Ambulatory Visit: Payer: Self-pay | Admitting: General Surgery

## 2024-09-20 DIAGNOSIS — Z1231 Encounter for screening mammogram for malignant neoplasm of breast: Secondary | ICD-10-CM

## 2024-09-22 ENCOUNTER — Other Ambulatory Visit: Payer: Self-pay | Admitting: Medical Genetics

## 2024-09-22 DIAGNOSIS — Z006 Encounter for examination for normal comparison and control in clinical research program: Secondary | ICD-10-CM

## 2024-11-15 ENCOUNTER — Inpatient Hospital Stay: Admission: RE | Admit: 2024-11-15 | Discharge: 2024-11-15 | Attending: General Surgery | Admitting: General Surgery

## 2024-11-15 DIAGNOSIS — Z1231 Encounter for screening mammogram for malignant neoplasm of breast: Secondary | ICD-10-CM
